# Patient Record
Sex: Male | Born: 1955 | Race: Black or African American | Hispanic: No | Smoking: Current every day smoker
Health system: Southern US, Community
[De-identification: ages and names within clinical notes are randomized; demographics above are authoritative.]

## PROBLEM LIST (undated history)

## (undated) DIAGNOSIS — M1990 Unspecified osteoarthritis, unspecified site: Secondary | ICD-10-CM

## (undated) DIAGNOSIS — J45909 Unspecified asthma, uncomplicated: Secondary | ICD-10-CM

## (undated) DIAGNOSIS — E7800 Pure hypercholesterolemia, unspecified: Secondary | ICD-10-CM

## (undated) DIAGNOSIS — I509 Heart failure, unspecified: Secondary | ICD-10-CM

## (undated) DIAGNOSIS — F4310 Post-traumatic stress disorder, unspecified: Secondary | ICD-10-CM

## (undated) DIAGNOSIS — M545 Low back pain, unspecified: Secondary | ICD-10-CM

## (undated) DIAGNOSIS — R51 Headache: Secondary | ICD-10-CM

## (undated) DIAGNOSIS — M25559 Pain in unspecified hip: Secondary | ICD-10-CM

## (undated) DIAGNOSIS — I10 Essential (primary) hypertension: Secondary | ICD-10-CM

## (undated) DIAGNOSIS — G8929 Other chronic pain: Secondary | ICD-10-CM

## (undated) DIAGNOSIS — J189 Pneumonia, unspecified organism: Secondary | ICD-10-CM

## (undated) DIAGNOSIS — R519 Headache, unspecified: Secondary | ICD-10-CM

## (undated) DIAGNOSIS — F319 Bipolar disorder, unspecified: Secondary | ICD-10-CM

## (undated) DIAGNOSIS — I639 Cerebral infarction, unspecified: Secondary | ICD-10-CM

## (undated) HISTORY — PX: FRACTURE SURGERY: SHX138

## (undated) HISTORY — PX: TONSILLECTOMY: SUR1361

---

## 1983-12-05 HISTORY — PX: ORIF ANKLE FRACTURE: SUR919

## 2013-01-14 ENCOUNTER — Encounter (HOSPITAL_COMMUNITY): Payer: Self-pay | Admitting: Emergency Medicine

## 2013-01-14 ENCOUNTER — Emergency Department (HOSPITAL_COMMUNITY)
Admission: EM | Admit: 2013-01-14 | Discharge: 2013-01-14 | Disposition: A | Discharge status: Home / Self Care | Payer: Self-pay | Attending: Emergency Medicine | Admitting: Emergency Medicine

## 2013-01-14 DIAGNOSIS — L0390 Cellulitis, unspecified: Secondary | ICD-10-CM

## 2013-01-14 DIAGNOSIS — Z79899 Other long term (current) drug therapy: Secondary | ICD-10-CM | POA: Insufficient documentation

## 2013-01-14 DIAGNOSIS — I10 Essential (primary) hypertension: Secondary | ICD-10-CM | POA: Insufficient documentation

## 2013-01-14 DIAGNOSIS — L738 Other specified follicular disorders: Secondary | ICD-10-CM | POA: Insufficient documentation

## 2013-01-14 DIAGNOSIS — F17200 Nicotine dependence, unspecified, uncomplicated: Secondary | ICD-10-CM | POA: Insufficient documentation

## 2013-01-14 DIAGNOSIS — L0201 Cutaneous abscess of face: Secondary | ICD-10-CM | POA: Insufficient documentation

## 2013-01-14 DIAGNOSIS — L739 Follicular disorder, unspecified: Secondary | ICD-10-CM

## 2013-01-14 DIAGNOSIS — J45909 Unspecified asthma, uncomplicated: Secondary | ICD-10-CM | POA: Insufficient documentation

## 2013-01-14 DIAGNOSIS — L03211 Cellulitis of face: Secondary | ICD-10-CM | POA: Insufficient documentation

## 2013-01-14 HISTORY — DX: Essential (primary) hypertension: I10

## 2013-01-14 MED ORDER — CEPHALEXIN 500 MG PO CAPS: 500.0000 mg | ORAL_CAPSULE | Freq: Four times a day (QID) | ORAL | Status: DC

## 2013-01-14 MED ORDER — HYDROCODONE-ACETAMINOPHEN 5-325 MG PO TABS: 1.0000 | ORAL_TABLET | Freq: Four times a day (QID) | ORAL | Status: DC | PRN

## 2013-01-14 MED ORDER — CEPHALEXIN 250 MG PO CAPS
500.0000 mg | ORAL_CAPSULE | Freq: Once | ORAL | Status: AC
  Administered 2013-01-14: 500 mg via ORAL
  Filled 2013-01-14: qty 2

## 2013-01-14 NOTE — ED Notes (Signed)
Pt c/o began with swelling to left corner of mouth noted last night. Pt woke up today with full lip swelling. Pt denies shortness of breath. Pt denies use of new products. Pt talking in complete sentences. Pt reports thought he had ingrown hair and comb his mustache yesterday morning.

## 2013-01-14 NOTE — ED Provider Notes (Signed)
History     CSN: [id number]  Arrival date & time 01/14/13  4098   First MD Initiated Contact with Patient 01/14/13 1112      Chief Complaint  Patient presents with  . Facial Swelling    (Consider location/radiation/quality/duration/timing/severity/associated sxs/prior treatment) HPI Patient presents emergency Department with swelling to the left chin and lower lip.  Patient, states, that he had an immigrant here in the area recently.  Patient denies fevers, nausea, vomiting, difficulty swallowing, difficulty breathing, neck pain, or neck swelling.  Patient did not take anything prior to arrival for his symptoms.  Patient, states palpation makes the pain, worse.  Patient denies anything makes it better. Past Medical History  Diagnosis Date  . Asthma   . Hypertension     History reviewed. No pertinent past surgical history.  No family history on file.  History  Substance Use Topics  . Smoking status: Current Every Day Smoker  . Smokeless tobacco: Not on file  . Alcohol Use: Yes     Comment: rarely      Review of Systems All other systems negative except as documented in the HPI. All pertinent positives and negatives as reviewed in the HPI. Allergies  Review of patient's allergies indicates no known allergies.  Home Medications   Current Outpatient Rx  Name  Route  Sig  Dispense  Refill  . ARIPiprazole (ABILIFY) 5 MG tablet   Oral   Take 5 mg by mouth daily.         Marland Kitchen ibuprofen (ADVIL,MOTRIN) 200 MG tablet   Oral   Take 800 mg by mouth every 6 (six) hours as needed for pain (for back pain.).         Marland Kitchen cephALEXin (KEFLEX) 500 MG capsule   Oral   Take 1 capsule (500 mg total) by mouth 4 (four) times daily.   40 capsule   0   . HYDROcodone-acetaminophen (NORCO/VICODIN) 5-325 MG per tablet   Oral   Take 1 tablet by mouth every 6 (six) hours as needed for pain.   15 tablet   0     BP 136/76  Pulse 54  Temp(Src) 98.1 F (36.7 C) (Oral)  Resp 16   SpO2 100%  Physical Exam  Constitutional: He appears well-developed and well-nourished. No distress.  HENT:  Head: Normocephalic and atraumatic.    Neck: Normal range of motion. Neck supple.  Cardiovascular: Normal rate and normal heart sounds.  Exam reveals no gallop and no friction rub.   No murmur heard. Pulmonary/Chest: Effort normal and breath sounds normal.    ED Course  Procedures (including critical care time)  Labs Reviewed - No data to display No results found.   1. Cellulitis   2. Folliculitis    Patient retreated for cellulitis and folliculitis, and advised to use heat on the area.  Advised him to return here in 2 days for a recheck.  Patient is a rest return here sooner for any worsening in his condition.   The patient has been seen by the attending Physician.   MDM         Carlyle Dolly, PA-C 01/15/13 1418

## 2013-01-15 NOTE — ED Provider Notes (Signed)
Medical screening examination/treatment/procedure(s) were performed by non-physician practitioner and as supervising physician I was immediately available for consultation/collaboration.   Charles B. Sheldon, MD 01/15/13 1553 

## 2013-02-26 ENCOUNTER — Emergency Department (HOSPITAL_COMMUNITY)
Admission: EM | Admit: 2013-02-26 | Discharge: 2013-02-26 | Disposition: A | Discharge status: Home / Self Care | Payer: No Typology Code available for payment source | Attending: Emergency Medicine | Admitting: Emergency Medicine

## 2013-02-26 ENCOUNTER — Encounter (HOSPITAL_COMMUNITY): Payer: Self-pay | Admitting: Emergency Medicine

## 2013-02-26 ENCOUNTER — Emergency Department (HOSPITAL_COMMUNITY): Payer: No Typology Code available for payment source

## 2013-02-26 DIAGNOSIS — Z79899 Other long term (current) drug therapy: Secondary | ICD-10-CM | POA: Insufficient documentation

## 2013-02-26 DIAGNOSIS — I10 Essential (primary) hypertension: Secondary | ICD-10-CM | POA: Insufficient documentation

## 2013-02-26 DIAGNOSIS — Y92410 Unspecified street and highway as the place of occurrence of the external cause: Secondary | ICD-10-CM | POA: Insufficient documentation

## 2013-02-26 DIAGNOSIS — F17200 Nicotine dependence, unspecified, uncomplicated: Secondary | ICD-10-CM | POA: Insufficient documentation

## 2013-02-26 DIAGNOSIS — IMO0002 Reserved for concepts with insufficient information to code with codable children: Secondary | ICD-10-CM | POA: Insufficient documentation

## 2013-02-26 DIAGNOSIS — J45909 Unspecified asthma, uncomplicated: Secondary | ICD-10-CM | POA: Insufficient documentation

## 2013-02-26 DIAGNOSIS — Y9389 Activity, other specified: Secondary | ICD-10-CM | POA: Insufficient documentation

## 2013-02-26 MED ORDER — IBUPROFEN 800 MG PO TABS
800.0000 mg | ORAL_TABLET | Freq: Once | ORAL | Status: AC
  Administered 2013-02-26: 800 mg via ORAL
  Filled 2013-02-26: qty 1

## 2013-02-26 MED ORDER — DIAZEPAM 5 MG PO TABS: 5.0000 mg | ORAL_TABLET | Freq: Two times a day (BID) | ORAL | Status: DC

## 2013-02-26 MED ORDER — IBUPROFEN 800 MG PO TABS: 800.0000 mg | ORAL_TABLET | Freq: Three times a day (TID) | ORAL | Status: DC
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
## 2013-03-18 ENCOUNTER — Encounter (HOSPITAL_COMMUNITY): Payer: Self-pay | Admitting: *Deleted

## 2013-03-18 ENCOUNTER — Emergency Department (HOSPITAL_COMMUNITY)
Admission: EM | Admit: 2013-03-18 | Discharge: 2013-03-18 | Disposition: A | Discharge status: Home / Self Care | Payer: No Typology Code available for payment source | Attending: Emergency Medicine | Admitting: Emergency Medicine

## 2013-03-18 DIAGNOSIS — M545 Low back pain, unspecified: Secondary | ICD-10-CM | POA: Insufficient documentation

## 2013-03-18 DIAGNOSIS — G8929 Other chronic pain: Secondary | ICD-10-CM | POA: Insufficient documentation

## 2013-03-18 DIAGNOSIS — Z79899 Other long term (current) drug therapy: Secondary | ICD-10-CM | POA: Insufficient documentation

## 2013-03-18 DIAGNOSIS — Y92410 Unspecified street and highway as the place of occurrence of the external cause: Secondary | ICD-10-CM | POA: Insufficient documentation

## 2013-03-18 DIAGNOSIS — I10 Essential (primary) hypertension: Secondary | ICD-10-CM | POA: Insufficient documentation

## 2013-03-18 DIAGNOSIS — J45909 Unspecified asthma, uncomplicated: Secondary | ICD-10-CM | POA: Insufficient documentation

## 2013-03-18 DIAGNOSIS — T148XXA Other injury of unspecified body region, initial encounter: Secondary | ICD-10-CM

## 2013-03-18 DIAGNOSIS — IMO0002 Reserved for concepts with insufficient information to code with codable children: Secondary | ICD-10-CM | POA: Insufficient documentation

## 2013-03-18 DIAGNOSIS — Y9389 Activity, other specified: Secondary | ICD-10-CM | POA: Insufficient documentation

## 2013-03-18 DIAGNOSIS — F17200 Nicotine dependence, unspecified, uncomplicated: Secondary | ICD-10-CM | POA: Insufficient documentation

## 2013-03-18 MED ORDER — METHOCARBAMOL 500 MG PO TABS: 500.0000 mg | ORAL_TABLET | Freq: Four times a day (QID) | ORAL | Status: DC | PRN

## 2013-03-18 NOTE — ED Notes (Signed)
Patient states that his chronic pain is the same.   Patient states that he went to doctor that was recommended here last time, but he was not able to afford.  He claims that the ibuprofen keeps him comfortable, but he can't go to the doctor.

## 2013-03-18 NOTE — ED Notes (Signed)
Pt was seen here on the 26th for lower back pain and given rx for ibuprofen.  States the meds have run out and the pain is getting worse, stating that his R leg is giving out.
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
## 2013-03-19 NOTE — ED Provider Notes (Signed)
Medical screening examination/treatment/procedure(s) were performed by non-physician practitioner and as supervising physician I was immediately available for consultation/collaboration.  Tobin Chad, MD 03/19/13 [phone]

## 2013-03-25 ENCOUNTER — Encounter (HOSPITAL_COMMUNITY): Payer: Self-pay | Admitting: Emergency Medicine

## 2013-03-25 ENCOUNTER — Emergency Department (HOSPITAL_COMMUNITY)
Admission: EM | Admit: 2013-03-25 | Discharge: 2013-03-25 | Disposition: A | Discharge status: Home / Self Care | Payer: No Typology Code available for payment source | Attending: Emergency Medicine | Admitting: Emergency Medicine

## 2013-03-25 DIAGNOSIS — Y929 Unspecified place or not applicable: Secondary | ICD-10-CM | POA: Insufficient documentation

## 2013-03-25 DIAGNOSIS — J45909 Unspecified asthma, uncomplicated: Secondary | ICD-10-CM | POA: Insufficient documentation

## 2013-03-25 DIAGNOSIS — S335XXA Sprain of ligaments of lumbar spine, initial encounter: Secondary | ICD-10-CM | POA: Insufficient documentation

## 2013-03-25 DIAGNOSIS — I10 Essential (primary) hypertension: Secondary | ICD-10-CM | POA: Insufficient documentation

## 2013-03-25 DIAGNOSIS — T148XXA Other injury of unspecified body region, initial encounter: Secondary | ICD-10-CM

## 2013-03-25 DIAGNOSIS — Y939 Activity, unspecified: Secondary | ICD-10-CM | POA: Insufficient documentation

## 2013-03-25 DIAGNOSIS — F17200 Nicotine dependence, unspecified, uncomplicated: Secondary | ICD-10-CM | POA: Insufficient documentation

## 2013-03-25 DIAGNOSIS — X58XXXA Exposure to other specified factors, initial encounter: Secondary | ICD-10-CM | POA: Insufficient documentation

## 2013-03-25 DIAGNOSIS — Z79899 Other long term (current) drug therapy: Secondary | ICD-10-CM | POA: Insufficient documentation

## 2013-03-25 MED ORDER — IBUPROFEN 800 MG PO TABS: 800.0000 mg | ORAL_TABLET | Freq: Three times a day (TID) | ORAL | Status: DC

## 2013-03-25 MED ORDER — DIAZEPAM 5 MG PO TABS: 5.0000 mg | ORAL_TABLET | Freq: Two times a day (BID) | ORAL | Status: DC | PRN

## 2013-03-25 NOTE — ED Provider Notes (Signed)
History     CSN: [id number]  Arrival date & time 03/25/13  0845   First MD Initiated Contact with Patient 03/25/13 [phone]      No chief complaint on file.  Chief complaint: back pain (Consider location/radiation/quality/duration/timing/severity/associated sxs/prior treatment) HPI Comments: Patient presents for an intermittent throbbing sensation in his right lower back that waxes and wanes in severity and sometimes radiates down his right leg. Patient has been evaluated in the emergency department within the last week for this complaint and states that his back pain symptoms have not changed since this time. Patient was given Robaxin to take for symptoms which he states made him nauseous with nonbloody, nonbilious emesis as well as giving him abdominal cramps and watery, nonbloody diarrhea. Patient states he stopped taking the medication a few days ago and these symptoms have completely resolved. Patient states that ibuprofen and Valium have helped his lower back discomfort and he has run out of both of these prescriptions. Patient denies any fever, weakness, inability to ambulate, saddle anesthesia, or bladder or bowel incontinence. He denies new or recent trauma to his back since last evaluation.  The history is provided by the patient. No language interpreter was used.    Past Medical History  Diagnosis Date  . Asthma   . Hypertension     Past Surgical History  Procedure Laterality Date  . Orif right ankle Right     No family history on file.  History  Substance Use Topics  . Smoking status: Current Every Day Smoker -- 0.50 packs/day    Types: Cigarettes  . Smokeless tobacco: Not on file  . Alcohol Use: Yes     Comment: rarely      Review of Systems  Constitutional: Negative for fever.  Musculoskeletal: Positive for myalgias and back pain.  Skin: Negative for pallor and rash.  Neurological: Negative for syncope, weakness and numbness.  All other systems reviewed and  are negative.    Allergies  Review of patient's allergies indicates no known allergies.  Home Medications   Current Outpatient Rx  Name  Route  Sig  Dispense  Refill  . ARIPiprazole (ABILIFY) 5 MG tablet   Oral   Take 5 mg by mouth daily.         Marland Kitchen ibuprofen (ADVIL,MOTRIN) 800 MG tablet   Oral   Take 800 mg by mouth every 8 (eight) hours as needed for pain.         . diazepam (VALIUM) 5 MG tablet   Oral   Take 1 tablet (5 mg total) by mouth 2 (two) times daily as needed for anxiety.   10 tablet   0   . ibuprofen (ADVIL,MOTRIN) 800 MG tablet   Oral   Take 1 tablet (800 mg total) by mouth 3 (three) times daily.   21 tablet   0   . methocarbamol (ROBAXIN) 500 MG tablet   Oral   Take 1 tablet (500 mg total) by mouth 4 (four) times daily as needed (back pain, spasm).   20 tablet   0     BP 136/83  Pulse 61  Temp(Src) 98.8 F (37.1 C) (Oral)  Resp 18  SpO2 99%  Physical Exam  Nursing note and vitals reviewed. Constitutional: He is oriented to person, place, and time. He appears well-developed and well-nourished.  HENT:  Head: Normocephalic and atraumatic.  Eyes: Conjunctivae are normal. No scleral icterus.  Neck: Normal range of motion.  Cardiovascular: Normal rate, regular rhythm and intact  distal pulses.   Pulmonary/Chest: Effort normal. No respiratory distress.  Abdominal: Soft. He exhibits no distension. There is no tenderness.  Musculoskeletal: Normal range of motion.       Cervical back: Normal.       Thoracic back: Normal.       Lumbar back: He exhibits tenderness. He exhibits normal range of motion, no bony tenderness and no deformity.       Back:  Patient with TTP of the right paraspinal muscles of the lumbar spine. No cervical, thoracic, or lumbar midline tenderness or bony deformities palpated. Patient exhibits full range of motion of his back with negative straight leg raising cross straight leg raise. No sensory or motor deficits appreciated.    Neurological: He is alert and oriented to person, place, and time. He has normal reflexes.  Skin: Skin is warm and dry. No rash noted. No erythema.  Psychiatric: He has a normal mood and affect. His behavior is normal.    ED Course  Procedures (including critical care time)  Labs Reviewed - No data to display No results found.   1. Muscle strain     MDM  Uncomplicated muscle strain. Patient presents for medication as he ran out of 800mg  ibuprofen and robaxin he received approximately 1 week ago gave him nausea, NB/NB emesis and abdominal cramping; he stopped taking robaxin a few days ago with complete relief of symptoms. Patient neurovascularly intact and ambulatory with normal gait; no saddle anesthesia or bowel/bladder incontinence. Stable for d/c with PCP follow up. Will prescribe ibuprofen and valium as patient has taken these in the past with relief. Indications for ED return discussed. Patient states comfort and understanding with d/c plan with no unaddressed concerns.   Medical screening examination/treatment/procedure(s) were performed by non-physician practitioner and as supervising physician I was immediately available for consultation/collaboration.     Antony Madura, PA-C 03/27/13 9811  Donnetta Hutching, MD 03/27/13 [phone]

## 2013-03-25 NOTE — ED Notes (Signed)
Pt started having an allergic reaction to a new medication. C/o nausea, vomiting, diarrhea, and feeling anxious.  Pt stopped med after symptoms started.

## 2013-04-07 ENCOUNTER — Encounter (HOSPITAL_COMMUNITY): Payer: Self-pay | Admitting: *Deleted

## 2013-04-07 ENCOUNTER — Emergency Department (HOSPITAL_COMMUNITY)
Admission: EM | Admit: 2013-04-07 | Discharge: 2013-04-07 | Disposition: A | Discharge status: Home / Self Care | Payer: Self-pay | Attending: Emergency Medicine | Admitting: Emergency Medicine

## 2013-04-07 DIAGNOSIS — I10 Essential (primary) hypertension: Secondary | ICD-10-CM | POA: Insufficient documentation

## 2013-04-07 DIAGNOSIS — L0201 Cutaneous abscess of face: Secondary | ICD-10-CM | POA: Insufficient documentation

## 2013-04-07 DIAGNOSIS — F17200 Nicotine dependence, unspecified, uncomplicated: Secondary | ICD-10-CM | POA: Insufficient documentation

## 2013-04-07 DIAGNOSIS — Z79899 Other long term (current) drug therapy: Secondary | ICD-10-CM | POA: Insufficient documentation

## 2013-04-07 DIAGNOSIS — L03211 Cellulitis of face: Secondary | ICD-10-CM | POA: Insufficient documentation

## 2013-04-07 DIAGNOSIS — J45909 Unspecified asthma, uncomplicated: Secondary | ICD-10-CM | POA: Insufficient documentation

## 2013-04-07 DIAGNOSIS — F319 Bipolar disorder, unspecified: Secondary | ICD-10-CM | POA: Insufficient documentation

## 2013-04-07 HISTORY — DX: Bipolar disorder, unspecified: F31.9

## 2013-04-07 MED ORDER — CLINDAMYCIN HCL 150 MG PO CAPS: 150.0000 mg | ORAL_CAPSULE | Freq: Four times a day (QID) | ORAL | Status: DC

## 2013-04-07 MED ORDER — HYDROCODONE-ACETAMINOPHEN 5-325 MG PO TABS: 2.0000 | ORAL_TABLET | ORAL | Status: DC | PRN

## 2013-04-07 NOTE — ED Notes (Signed)
Pt is here with right facial abscess that is draining

## 2013-04-07 NOTE — ED Provider Notes (Signed)
History     CSN: [id number]  Arrival date & time 04/07/13  0918   First MD Initiated Contact with Patient 04/07/13 1033      Chief Complaint  Patient presents with  . Abscess    (Consider location/radiation/quality/duration/timing/severity/associated sxs/prior treatment) HPI  57 year old male presents for evaluations of a boil on his face.  Patient reports gradual onset of swelling and pain to his right cheek for the past 2-3 days. Pain is described as a sharp throbbing sensation, nonradiating, worsening with palpation. Swelling is similar to a prior abscess that he had on his left side of face in the past. He believes it is related to ingrown hair. He has been using warm compress on face and his is having some mild drainage. He is here requesting for antibiotic and pain medication only. Patient does not want any incision and drainage here. She also denies fever, headache, dental pain, or recent trauma.  Past Medical History  Diagnosis Date  . Asthma   . Hypertension   . Bipolar 1 disorder     Past Surgical History  Procedure Laterality Date  . Orif right ankle Right     No family history on file.  History  Substance Use Topics  . Smoking status: Current Every Day Smoker -- 0.50 packs/day    Types: Cigarettes  . Smokeless tobacco: Not on file  . Alcohol Use: Yes     Comment: rarely      Review of Systems  HENT: Positive for facial swelling. Negative for sore throat and dental problem.   Skin: Negative for rash.  Neurological: Negative for numbness and headaches.    Allergies  Methocarbamol  Home Medications   Current Outpatient Rx  Name  Route  Sig  Dispense  Refill  . ARIPiprazole (ABILIFY) 5 MG tablet   Oral   Take 5 mg by mouth daily.         . diazepam (VALIUM) 5 MG tablet   Oral   Take 1 tablet (5 mg total) by mouth 2 (two) times daily as needed for anxiety.   10 tablet   0   . ibuprofen (ADVIL,MOTRIN) 800 MG tablet   Oral   Take 800 mg by  mouth every 8 (eight) hours as needed for pain.           BP 138/75  Pulse 58  Temp(Src) 98.3 F (36.8 C)  Resp 18  SpO2 98%  Physical Exam  Nursing note and vitals reviewed. Constitutional: He appears well-developed and well-nourished. No distress.  HENT:  Head: Atraumatic.  No dental pain.  No trismus.  Eyes: Conjunctivae are normal.  Neck: Neck supple.  Lymphadenopathy:    He has no cervical adenopathy.  Neurological: He is alert.  Skin: Skin is warm.  R cheek on face: an area of induration and fluctuance measuring 2cm in diameter were noted with tenderness to palpation, no erythema.  No fb seen or palpated.        ED Course  Procedures (including critical care time)  Pt with abscess to R cheek amenable for I&D however pt request for abx and pain medication only.  Sts he would like to continue using warm compress and treat sxs at home.  I suggest to return if sxs worsen despite using abx and pain meds.  Pt agrees with plan.    Labs Reviewed - No data to display No results found.   1. Cutaneous abscess of face       MDM  BP 138/75  Pulse 58  Temp(Src) 98.3 F (36.8 C)  Resp 18  SpO2 98%         Fayrene Helper, PA-C 04/07/13 1042

## 2013-04-07 NOTE — ED Provider Notes (Signed)
Medical screening examination/treatment/procedure(s) were performed by non-physician practitioner and as supervising physician I was immediately available for consultation/collaboration. Devoria Albe, MD, FACEP   Ward Givens, MD 04/07/13 1049

## 2013-04-10 ENCOUNTER — Emergency Department (HOSPITAL_COMMUNITY)
Admission: EM | Admit: 2013-04-10 | Discharge: 2013-04-10 | Disposition: A | Discharge status: Home / Self Care | Payer: Self-pay | Attending: Emergency Medicine | Admitting: Emergency Medicine

## 2013-04-10 ENCOUNTER — Encounter (HOSPITAL_COMMUNITY): Payer: Self-pay | Admitting: Emergency Medicine

## 2013-04-10 DIAGNOSIS — L0201 Cutaneous abscess of face: Secondary | ICD-10-CM | POA: Insufficient documentation

## 2013-04-10 DIAGNOSIS — I10 Essential (primary) hypertension: Secondary | ICD-10-CM | POA: Insufficient documentation

## 2013-04-10 DIAGNOSIS — L0291 Cutaneous abscess, unspecified: Secondary | ICD-10-CM

## 2013-04-10 DIAGNOSIS — Z79899 Other long term (current) drug therapy: Secondary | ICD-10-CM | POA: Insufficient documentation

## 2013-04-10 DIAGNOSIS — F17200 Nicotine dependence, unspecified, uncomplicated: Secondary | ICD-10-CM | POA: Insufficient documentation

## 2013-04-10 DIAGNOSIS — F319 Bipolar disorder, unspecified: Secondary | ICD-10-CM | POA: Insufficient documentation

## 2013-04-10 DIAGNOSIS — L03211 Cellulitis of face: Secondary | ICD-10-CM | POA: Insufficient documentation

## 2013-04-10 DIAGNOSIS — J45909 Unspecified asthma, uncomplicated: Secondary | ICD-10-CM | POA: Insufficient documentation

## 2013-04-10 DIAGNOSIS — Z888 Allergy status to other drugs, medicaments and biological substances status: Secondary | ICD-10-CM | POA: Insufficient documentation

## 2013-04-10 NOTE — ED Provider Notes (Signed)
History     CSN: [id number]  Arrival date & time 04/10/13  0919   First MD Initiated Contact with Patient 04/10/13 1021      Chief Complaint  Patient presents with  . Abscess    (Consider location/radiation/quality/duration/timing/severity/associated sxs/prior treatment) HPI Comments: Patient presents to the ED for right facial abscess. Abscess initially started after pt shaved his face a few days ago.  Pt seen 3 days ago for the same, abscess was not drained at that time.  He was started on clindamycin which he has been taking as directed but notes there has been no improvement.  Denies any drainage from abscess for the past few days.  Notes area has become larger and is increasingly TTP.  Denies any numbness or paresthesias of face.  Prior abscesses similar to this requiring I&D, also from shaving.  No recent fever, sweats, or chills.  Patient is a 57 y.o. male presenting with abscess. The history is provided by the patient.  Abscess   Past Medical History  Diagnosis Date  . Asthma   . Hypertension   . Bipolar 1 disorder     Past Surgical History  Procedure Laterality Date  . Orif right ankle Right     No family history on file.  History  Substance Use Topics  . Smoking status: Current Every Day Smoker -- 0.50 packs/day    Types: Cigarettes  . Smokeless tobacco: Not on file  . Alcohol Use: Yes     Comment: rarely      Review of Systems  Skin:       abscess  All other systems reviewed and are negative.    Allergies  Methocarbamol  Home Medications   Current Outpatient Rx  Name  Route  Sig  Dispense  Refill  . ARIPiprazole (ABILIFY) 5 MG tablet   Oral   Take 5 mg by mouth daily.         . clindamycin (CLEOCIN) 150 MG capsule   Oral   Take 150 mg by mouth 3 (three) times daily. 7 day course started on 04-07-13         . diazepam (VALIUM) 5 MG tablet   Oral   Take 1 tablet (5 mg total) by mouth 2 (two) times daily as needed for anxiety.   10  tablet   0   . HYDROcodone-acetaminophen (NORCO/VICODIN) 5-325 MG per tablet   Oral   Take 2 tablets by mouth every 4 (four) hours as needed for pain.   10 tablet   0   . ibuprofen (ADVIL,MOTRIN) 800 MG tablet   Oral   Take 800 mg by mouth every 8 (eight) hours as needed for pain.           BP 133/90  Pulse 57  Temp(Src) 98 F (36.7 C) (Oral)  Resp 12  Ht 5\' 9"  (1.753 m)  Wt 160 lb (72.576 kg)  BMI 23.62 kg/m2  SpO2 96%  Physical Exam  Nursing note and vitals reviewed. Constitutional: He is oriented to person, place, and time. He appears well-developed and well-nourished.  HENT:  Head: Normocephalic and atraumatic.    Eyes: Conjunctivae and EOM are normal. Pupils are equal, round, and reactive to light.  Neck: Normal range of motion.  Cardiovascular: Normal rate, regular rhythm and normal heart sounds.   Pulmonary/Chest: Effort normal and breath sounds normal.  Musculoskeletal: Normal range of motion.  Neurological: He is alert and oriented to person, place, and time.  Skin: Skin is  warm and dry.  Psychiatric: He has a normal mood and affect.    ED Course  Procedures (including critical care time)  INCISION AND DRAINAGE Performed by: Garlon Hatchet Consent: Verbal consent obtained. Risks and benefits: risks, benefits and alternatives were discussed Type: abscess  Body area: right cheek  Anesthesia: local infiltration  Incision was made with a scalpel.  Local anesthetic: lidocaine 2% without epi  Anesthetic total: 2 ml  Complexity: complex Blunt dissection to break up loculations  Drainage: purulent  Drainage amount: small  Packing material: 1/4 in iodoform gauze  Patient tolerance: Patient tolerated the procedure well with no immediate complications.     Labs Reviewed - No data to display No results found.   1. Abscess       MDM   Pt presenting to the ED for abscess of right cheek.  Seen 3 days ago for same but I&D not  performed.  Taking clindamycin as directed without improvement.  Incision of wound as above.  Small amount of purulent material expelled.  Packing material inserted to keep wound open and facilitate further drainage.  Continue clindamycin as directed.  FU with [facility] urgent care in 2-3 days for packing removal/wound check.  Discussed plan with pt, he agreed.  Return precautions advised.      Garlon Hatchet, PA-C 04/10/13 1541

## 2013-04-10 NOTE — ED Notes (Signed)
Pt states he came to ED on Monday for his abscess on the right side of his face. Pt states he was given an antibiotic and instruction to return if the abscess did not go away. Pt states he was told to come back and to get it drained if it was not better in three days. Pt is here on the third day. Pt has been taking his antibiotic every six hours. Pt states he is on day four of his antibiotic.

## 2013-04-11 NOTE — ED Provider Notes (Signed)
Medical screening examination/treatment/procedure(s) were performed by non-physician practitioner and as supervising physician I was immediately available for consultation/collaboration.   Loren Racer, MD 04/11/13 [phone]

## 2013-08-18 ENCOUNTER — Encounter: Payer: Self-pay | Admitting: Nurse Practitioner

## 2013-08-20 ENCOUNTER — Ambulatory Visit: Payer: Self-pay | Admitting: Nurse Practitioner

## 2013-09-09 ENCOUNTER — Encounter (HOSPITAL_COMMUNITY): Payer: Self-pay | Admitting: Emergency Medicine

## 2013-09-09 ENCOUNTER — Emergency Department (HOSPITAL_COMMUNITY)
Admission: EM | Admit: 2013-09-09 | Discharge: 2013-09-09 | Disposition: A | Discharge status: Home / Self Care | Payer: Self-pay | Attending: Emergency Medicine | Admitting: Emergency Medicine

## 2013-09-09 DIAGNOSIS — J45909 Unspecified asthma, uncomplicated: Secondary | ICD-10-CM | POA: Insufficient documentation

## 2013-09-09 DIAGNOSIS — Z79899 Other long term (current) drug therapy: Secondary | ICD-10-CM | POA: Insufficient documentation

## 2013-09-09 DIAGNOSIS — I10 Essential (primary) hypertension: Secondary | ICD-10-CM | POA: Insufficient documentation

## 2013-09-09 DIAGNOSIS — M549 Dorsalgia, unspecified: Secondary | ICD-10-CM | POA: Insufficient documentation

## 2013-09-09 DIAGNOSIS — R21 Rash and other nonspecific skin eruption: Secondary | ICD-10-CM | POA: Insufficient documentation

## 2013-09-09 DIAGNOSIS — F319 Bipolar disorder, unspecified: Secondary | ICD-10-CM | POA: Insufficient documentation

## 2013-09-09 DIAGNOSIS — Z888 Allergy status to other drugs, medicaments and biological substances status: Secondary | ICD-10-CM | POA: Insufficient documentation

## 2013-09-09 DIAGNOSIS — F17200 Nicotine dependence, unspecified, uncomplicated: Secondary | ICD-10-CM | POA: Insufficient documentation

## 2013-09-09 DIAGNOSIS — G8929 Other chronic pain: Secondary | ICD-10-CM | POA: Insufficient documentation

## 2013-09-09 LAB — HIV ANTIBODY (ROUTINE TESTING W REFLEX): HIV: NONREACTIVE

## 2013-09-09 MED ORDER — LORATADINE 10 MG PO TABS: 10.0000 mg | ORAL_TABLET | Freq: Every day | ORAL | Status: DC

## 2013-09-09 MED ORDER — PERMETHRIN 5 % EX CREA: TOPICAL_CREAM | Freq: Once | CUTANEOUS | Status: DC

## 2013-09-09 NOTE — ED Notes (Signed)
C/o "knot on left arm-- and rash on both arms. Also have a knot on my back" --

## 2013-09-09 NOTE — ED Provider Notes (Addendum)
CSN: [id number]     Arrival date & time 09/09/13  1478 History   First MD Initiated Contact with Patient 09/09/13 [phone]     Chief Complaint  Patient presents with  . Rash   HPI Pt believes he was bitten by something  Two days ago.  Since then it has turned black and blue and he noticed a rash on his arms.    The bite does not hurt but the rash does feel itchy. No fevers.  No vomiting or diarrhea.  No muscle aches.   Past Medical History  Diagnosis Date  . Asthma   . Hypertension   . Bipolar 1 disorder    Past Surgical History  Procedure Laterality Date  . Orif right ankle Right 1985   History reviewed. No pertinent family history. History  Substance Use Topics  . Smoking status: Current Every Day Smoker -- 0.50 packs/day for 18 years    Types: Cigarettes  . Smokeless tobacco: Not on file  . Alcohol Use: Yes     Comment: 2 drinks weekly     Review of Systems  Constitutional: Negative for fever and fatigue.  Musculoskeletal: Positive for back pain (chronic). Negative for joint swelling.  Skin: Positive for rash.    Allergies  Methocarbamol  Home Medications   Current Outpatient Rx  Name  Route  Sig  Dispense  Refill  . acetaminophen (TYLENOL) 500 MG tablet   Oral   Take 500 mg by mouth as needed for pain.         . ARIPiprazole (ABILIFY) 5 MG tablet   Oral   Take 5 mg by mouth daily.         Marland Kitchen loratadine (CLARITIN) 10 MG tablet   Oral   Take 1 tablet (10 mg total) by mouth daily.   14 tablet   0   . permethrin (ELIMITE) 5 % cream   Topical   Apply topically once.   60 g   0    BP 148/84  Pulse 65  Temp(Src) 97.9 F (36.6 C) (Oral)  Resp 16  SpO2 97% Physical Exam  Nursing note and vitals reviewed. Constitutional: He appears well-developed and well-nourished. No distress.  HENT:  Head: Normocephalic and atraumatic.  Right Ear: External ear normal.  Left Ear: External ear normal.  Eyes: Conjunctivae are normal. Right eye exhibits no discharge.  Left eye exhibits no discharge. No scleral icterus.  Neck: Neck supple. No tracheal deviation present.  Cardiovascular: Normal rate.   Pulmonary/Chest: Effort normal. No stridor. No respiratory distress.  Musculoskeletal: He exhibits no edema.  Neurological: He is alert. Cranial nerve deficit: no gross deficits.  Skin: Skin is warm and dry.  Small half centimeter area of induration left anterior volar aspect of the forearm with a surrounding area ecchymoses that does not blanch, no pustule, no fluctuance; several small areas of excoriated papules on bilateral forearms extending proximally, no surrounding erythema  Psychiatric: He has a normal mood and affect.    ED Course  Procedures (including critical care time) Labs Review Labs Reviewed - No data to display Imaging Review No results found.  MDM   1. Rash    The patient's small circular ring area seems to be an area of ecchymoses, there is no abscess or fluctuance. This does not appear to be consistent with a target lesion. The area does not blanch. Patient does have small  areas of papules this could be related to scabies. Treat  With antihistamines and  Elimite.     Celene Kras, MD 09/09/13 [phone]  Pt requests an HIV screen.  The lesion could be consistent with a kaposi's lesion.  HIV screen added on.  Celene Kras, MD 09/09/13 [phone]

## 2013-09-10 ENCOUNTER — Telehealth (HOSPITAL_COMMUNITY): Payer: Self-pay | Admitting: Emergency Medicine

## 2013-09-12 ENCOUNTER — Encounter (HOSPITAL_COMMUNITY): Payer: Self-pay | Admitting: Emergency Medicine

## 2013-09-12 ENCOUNTER — Emergency Department (HOSPITAL_COMMUNITY)
Admission: EM | Admit: 2013-09-12 | Discharge: 2013-09-12 | Disposition: A | Discharge status: Home / Self Care | Payer: No Typology Code available for payment source | Attending: Emergency Medicine | Admitting: Emergency Medicine

## 2013-09-12 DIAGNOSIS — R21 Rash and other nonspecific skin eruption: Secondary | ICD-10-CM | POA: Insufficient documentation

## 2013-09-12 DIAGNOSIS — Z79899 Other long term (current) drug therapy: Secondary | ICD-10-CM | POA: Insufficient documentation

## 2013-09-12 DIAGNOSIS — F319 Bipolar disorder, unspecified: Secondary | ICD-10-CM | POA: Insufficient documentation

## 2013-09-12 DIAGNOSIS — J45909 Unspecified asthma, uncomplicated: Secondary | ICD-10-CM | POA: Insufficient documentation

## 2013-09-12 DIAGNOSIS — I10 Essential (primary) hypertension: Secondary | ICD-10-CM | POA: Insufficient documentation

## 2013-09-12 DIAGNOSIS — F17200 Nicotine dependence, unspecified, uncomplicated: Secondary | ICD-10-CM | POA: Insufficient documentation

## 2013-09-12 DIAGNOSIS — Z760 Encounter for issue of repeat prescription: Secondary | ICD-10-CM

## 2013-09-12 MED ORDER — PERMETHRIN 5 % EX CREA
TOPICAL_CREAM | Freq: Once | CUTANEOUS | Status: AC
  Administered 2013-09-12: 10:00:00 via TOPICAL
  Filled 2013-09-12: qty 60

## 2013-09-12 NOTE — ED Provider Notes (Signed)
Medical screening examination/treatment/procedure(s) were performed by non-physician practitioner and as supervising physician I was immediately available for consultation/collaboration.   Darlys Gales, MD 09/12/13 [phone]

## 2013-09-12 NOTE — ED Provider Notes (Signed)
CSN: [id number]     Arrival date & time 09/12/13  0919 History  This chart was scribed for non-physician practitioner Sharilyn Sites, PA-C working with Darlys Gales, MD by Joaquin Music, ED Scribe. This patient was seen in room TR06C/TR06C and the patient's care was started at 9:35 AM .    Chief Complaint  Patient presents with  . Medication Refill    The history is provided by the patient. No language interpreter was used.   HPI Comments: Ricardo Brooks is a 57 y.o. male who presents to the Emergency Department complaining of bilateral upper extremities rash x3 days. Pt came to the ED, dx with likely scabies and was prescribed permethrin but states he could not fill it because it cost $95. Was instructed to take benadryl which he tried but states it made his sleepy but did not help the itching.  Has not tried any other medications.  No fevers, sweats, or chills.  No arthralgias or myalgias.  No recent tick exposure.  Past Medical History  Diagnosis Date  . Asthma   . Hypertension   . Bipolar 1 disorder    Past Surgical History  Procedure Laterality Date  . Orif right ankle Right 1985   No family history on file. History  Substance Use Topics  . Smoking status: Current Every Day Smoker -- 0.50 packs/day for 18 years    Types: Cigarettes  . Smokeless tobacco: Not on file  . Alcohol Use: Yes     Comment: 2 drinks weekly     Review of Systems  Skin: Positive for rash.  All other systems reviewed and are negative.    Allergies  Methocarbamol  Home Medications   Current Outpatient Rx  Name  Route  Sig  Dispense  Refill  . acetaminophen (TYLENOL) 500 MG tablet   Oral   Take 500 mg by mouth as needed for pain.         . ARIPiprazole (ABILIFY) 5 MG tablet   Oral   Take 5 mg by mouth daily.         Marland Kitchen loratadine (CLARITIN) 10 MG tablet   Oral   Take 1 tablet (10 mg total) by mouth daily.   14 tablet   0   . permethrin (ELIMITE) 5 % cream   Topical  Apply topically once.   60 g   0    Triage Vitals:BP 138/78  Pulse 65  Temp(Src) 97.1 F (36.2 C)  SpO2 99%  Physical Exam  Nursing note and vitals reviewed. Constitutional: He is oriented to person, place, and time. He appears well-developed and well-nourished. No distress.  HENT:  Head: Normocephalic and atraumatic.  Mouth/Throat: Oropharynx is clear and moist.  Eyes: Conjunctivae and EOM are normal. Pupils are equal, round, and reactive to light.  Neck: Normal range of motion. Neck supple.  Cardiovascular: Normal rate, regular rhythm and normal heart sounds.   Pulmonary/Chest: Effort normal and breath sounds normal. No respiratory distress. He has no wheezes.  Musculoskeletal: Normal range of motion.  Neurological: He is alert and oriented to person, place, and time.  Skin: Skin is warm and dry. Rash noted. He is not diaphoretic.  Pruritic rash to bilateral forearms; some lesions with excoriation; no surrounding erythema, drainage, induration, or signs of cellulitis  Psychiatric: He has a normal mood and affect.    ED Course  Procedures  DIAGNOSTIC STUDIES: Oxygen Saturation is 97% on RA, normal by my interpretation.    COORDINATION OF CARE: 9:37 AM-Discussed  treatment plan which includes prescribing medication. Pt agreed to plan.   Labs Review Labs Reviewed - No data to display Imaging Review No results found.  MDM   1. Medication refill     Permethrin given in the ED, pt was instructed on use. FU with [facility] if problems occur.  Discussed plan with pt, they agreed.  Return precautions advised.  I personally performed the services described in this documentation, which was scribed in my presence. The recorded information has been reviewed and is accurate.  Garlon Hatchet, PA-C 09/12/13 1246

## 2013-09-12 NOTE — ED Notes (Signed)
Pt was here yesterday. Dx'd with scabies. Was unable to afford the Rx given.

## 2014-06-06 ENCOUNTER — Emergency Department (HOSPITAL_COMMUNITY)
Admission: EM | Admit: 2014-06-06 | Discharge: 2014-06-06 | Disposition: A | Discharge status: Home / Self Care | Payer: No Typology Code available for payment source | Attending: Emergency Medicine | Admitting: Emergency Medicine

## 2014-06-06 ENCOUNTER — Encounter (HOSPITAL_COMMUNITY): Payer: Self-pay | Admitting: Emergency Medicine

## 2014-06-06 DIAGNOSIS — G8929 Other chronic pain: Secondary | ICD-10-CM

## 2014-06-06 DIAGNOSIS — F17200 Nicotine dependence, unspecified, uncomplicated: Secondary | ICD-10-CM | POA: Insufficient documentation

## 2014-06-06 DIAGNOSIS — M545 Low back pain, unspecified: Secondary | ICD-10-CM | POA: Insufficient documentation

## 2014-06-06 DIAGNOSIS — F319 Bipolar disorder, unspecified: Secondary | ICD-10-CM | POA: Insufficient documentation

## 2014-06-06 DIAGNOSIS — I10 Essential (primary) hypertension: Secondary | ICD-10-CM | POA: Insufficient documentation

## 2014-06-06 DIAGNOSIS — Z79899 Other long term (current) drug therapy: Secondary | ICD-10-CM | POA: Insufficient documentation

## 2014-06-06 DIAGNOSIS — J45909 Unspecified asthma, uncomplicated: Secondary | ICD-10-CM | POA: Insufficient documentation

## 2014-06-06 MED ORDER — HYDROCODONE-ACETAMINOPHEN 5-325 MG PO TABS: 1.0000 | ORAL_TABLET | ORAL | Status: DC | PRN

## 2014-06-06 MED ORDER — CYCLOBENZAPRINE HCL 10 MG PO TABS
5.0000 mg | ORAL_TABLET | Freq: Once | ORAL | Status: AC
  Administered 2014-06-06: 5 mg via ORAL
  Filled 2014-06-06: qty 1

## 2014-06-06 MED ORDER — IBUPROFEN 400 MG PO TABS
600.0000 mg | ORAL_TABLET | Freq: Once | ORAL | Status: AC
  Administered 2014-06-06: 600 mg via ORAL
  Filled 2014-06-06 (×2): qty 1

## 2014-06-06 MED ORDER — CYCLOBENZAPRINE HCL 10 MG PO TABS
5.0000 mg | ORAL_TABLET | Freq: Two times a day (BID) | ORAL | Status: DC | PRN
Start: 2014-06-06 — End: 2014-07-06

## 2014-06-06 MED ORDER — IBUPROFEN 600 MG PO TABS: 600.0000 mg | ORAL_TABLET | Freq: Four times a day (QID) | ORAL | Status: DC | PRN

## 2014-06-06 NOTE — ED Notes (Signed)
Declined W/C at D/C and was escorted to lobby by RN. 
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
## 2014-06-06 NOTE — Discharge Instructions (Signed)
Chronic Back Pain ° When back pain lasts longer than 3 months, it is called chronic back pain. People with chronic back pain often go through certain periods that are more intense (flare-ups).  °CAUSES °Chronic back pain can be caused by wear and tear (degeneration) on different structures in your back. These structures include: °· The bones of your spine (vertebrae) and the joints surrounding your spinal cord and nerve roots (facets). °· The strong, fibrous tissues that connect your vertebrae (ligaments). °Degeneration of these structures may result in pressure on your nerves. This can lead to constant pain. °HOME CARE INSTRUCTIONS °· Avoid bending, heavy lifting, prolonged sitting, and activities which make the problem worse. °· Take brief periods of rest throughout the day to reduce your pain. Lying down or standing usually is better than sitting while you are resting. °· Take over-the-counter or prescription medicines only as directed by your caregiver. °SEEK IMMEDIATE MEDICAL CARE IF:  °· You have weakness or numbness in one of your legs or feet. °· You have trouble controlling your bladder or bowels. °· You have nausea, vomiting, abdominal pain, shortness of breath, or fainting. °Document Released: 12/28/2004 Document Revised: 02/12/2012 Document Reviewed: 11/04/2011 °ExitCare® Patient Information ©2015 [employer]. This information is not intended to replace advice given to you by your health care provider. Make sure you discuss any questions you have with your health care provider. ° °Back Pain, Adult °Low back pain is very common. About 1 in 5 people have back pain. The cause of low back pain is rarely dangerous. The pain often gets better over time. About half of people with a sudden onset of back pain feel better in just 2 weeks. About 8 in 10 people feel better by 6 weeks.  °CAUSES °Some common causes of back pain include: °· Strain of the muscles or ligaments supporting the spine. °· Wear and tear  (degeneration) of the spinal discs. °· Arthritis. °· Direct injury to the back. °DIAGNOSIS °Most of the time, the direct cause of low back pain is not known. However, back pain can be treated effectively even when the exact cause of the pain is unknown. Answering your caregiver's questions about your overall health and symptoms is one of the most accurate ways to make sure the cause of your pain is not dangerous. If your caregiver needs more information, he or she may order lab work or imaging tests (X-rays or MRIs). However, even if imaging tests show changes in your back, this usually does not require surgery. °HOME CARE INSTRUCTIONS °For many people, back pain returns. Since low back pain is rarely dangerous, it is often a condition that people can learn to manage on their own.  °· Remain active. It is stressful on the back to sit or stand in one place. Do not sit, drive, or stand in one place for more than 30 minutes at a time. Take short walks on level surfaces as soon as pain allows. Try to increase the length of time you walk each day. °· Do not stay in bed. Resting more than 1 or 2 days can delay your recovery. °· Do not avoid exercise or work. Your body is made to move. It is not dangerous to be active, even though your back may hurt. Your back will likely heal faster if you return to being active before your pain is gone. °· Pay attention to your body when you  bend and lift. Many people have less discomfort when lifting if they bend their knees, keep the load close to their bodies, and avoid twisting. Often, the most comfortable   positions are those that put less stress on your recovering back.  Find a comfortable position to sleep. Use a firm mattress and lie on your side with your knees slightly bent. If you lie on your back, put a pillow under your knees.  Only take over-the-counter or prescription medicines as directed by your caregiver. Over-the-counter medicines to reduce pain and inflammation  are often the most helpful.Your caregiver may prescribe muscle relaxant drugs.These medicines help dull your pain so you can more quickly return to your normal activities and healthy exercise.  Put ice on the injured area.  Put ice in a plastic bag.  Place a towel between your skin and the bag.  Leave the ice on for 15-20 minutes, 03-04 times a day for the first 2 to 3 days. After that, ice and heat may be alternated to reduce pain and spasms.  Ask your caregiver about trying back exercises and gentle massage. This may be of some benefit.  Avoid feeling anxious or stressed.Stress increases muscle tension and can worsen back pain.It is important to recognize when you are anxious or stressed and learn ways to manage it.Exercise is a great option. SEEK MEDICAL CARE IF:  You have pain that is not relieved with rest or medicine.  You have pain that does not improve in 1 week.  You have new symptoms.  You are generally not feeling well. SEEK IMMEDIATE MEDICAL CARE IF:   You have pain that radiates from your back into your legs.  You develop new bowel or bladder control problems.  You have unusual weakness or numbness in your arms or legs.  You develop nausea or vomiting.  You develop abdominal pain.  You feel faint. Document Released: 11/20/2005 Document Revised: 05/21/2012 Document Reviewed: 04/10/2011 [facility] Patient Information 2015 [place], [place]. This information is not intended to replace advice given to you by your health care provider. Make sure you discuss any questions you have with your health care provider.

## 2014-06-07 NOTE — ED Provider Notes (Signed)
Medical screening examination/treatment/procedure(s) were performed by non-physician practitioner and as supervising physician I was immediately available for consultation/collaboration.  Jasani Lengel E Umair Rosiles, MD 06/07/14 1219 

## 2014-07-03 ENCOUNTER — Encounter (HOSPITAL_COMMUNITY): Payer: Self-pay | Admitting: Emergency Medicine

## 2014-07-03 ENCOUNTER — Emergency Department (HOSPITAL_COMMUNITY)
Admission: EM | Admit: 2014-07-03 | Discharge: 2014-07-03 | Disposition: A | Discharge status: Home / Self Care | Payer: No Typology Code available for payment source | Attending: Emergency Medicine | Admitting: Emergency Medicine

## 2014-07-03 DIAGNOSIS — M545 Low back pain, unspecified: Secondary | ICD-10-CM

## 2014-07-03 DIAGNOSIS — F319 Bipolar disorder, unspecified: Secondary | ICD-10-CM | POA: Insufficient documentation

## 2014-07-03 DIAGNOSIS — J45909 Unspecified asthma, uncomplicated: Secondary | ICD-10-CM | POA: Insufficient documentation

## 2014-07-03 DIAGNOSIS — Z79899 Other long term (current) drug therapy: Secondary | ICD-10-CM | POA: Insufficient documentation

## 2014-07-03 DIAGNOSIS — M549 Dorsalgia, unspecified: Secondary | ICD-10-CM | POA: Insufficient documentation

## 2014-07-03 DIAGNOSIS — F17200 Nicotine dependence, unspecified, uncomplicated: Secondary | ICD-10-CM | POA: Insufficient documentation

## 2014-07-03 DIAGNOSIS — I10 Essential (primary) hypertension: Secondary | ICD-10-CM | POA: Insufficient documentation

## 2014-07-03 MED ORDER — HYDROCODONE-ACETAMINOPHEN 5-325 MG PO TABS: 1.0000 | ORAL_TABLET | ORAL | Status: DC | PRN

## 2014-07-03 MED ORDER — CYCLOBENZAPRINE HCL 10 MG PO TABS: 10.0000 mg | ORAL_TABLET | Freq: Two times a day (BID) | ORAL | Status: DC | PRN

## 2014-07-03 NOTE — ED Provider Notes (Signed)
Medical screening examination/treatment/procedure(s) were performed by non-physician practitioner and as supervising physician I was immediately available for consultation/collaboration.   EKG Interpretation None        Junius ArgyleForrest S Sendy Pluta, MD 07/03/14 1640

## 2014-07-03 NOTE — ED Notes (Signed)
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   Patient states he is "almost out of pain medication".

## 2014-07-03 NOTE — Discharge Instructions (Signed)
Take Vicodin as needed for pain. Take Flexeril as needed for muscle spasm. Follow up with a primary care provider. Follow up with the health department.

## 2014-07-03 NOTE — Progress Notes (Signed)
  CARE MANAGEMENT ED NOTE 07/03/2014  Patient:  Trachtenberg,Mandeep   Account Number:  [id number]  Date Initiated:  07/03/2014  Documentation initiated by:  Ferdinand CavaSCHETTINO,Chyane Greer  Subjective/Objective Assessment:   58 yo male presenting to the ED with c/o pain     Subjective/Objective Assessment Detail:     Action/Plan:   Patient will follow up with with [facility] and community resources   Action/Plan Detail:   Anticipated DC Date:       Status Recommendation to Physician:   Result of Recommendation:  Agreed    DC Planning Services  CM consult  Other  PCP issues    Choice offered to / List presented to:  C-1 Patient          Status of service:  Completed, signed off  ED Comments:   ED Comments Detail:  CM consulted for PCP assist. This CM spoke with the patient regarding his insurance status. The patient stated that he has applied for Medicaid and it is pending. He also stated that he has recently qualified for [facility] and medical benefits which will start in December. Discussed the orange card with the patient as a bridge to other services. Will provide information to [facility] to follow up with patient to connect to PCP and pharmacy. ED PA made aware of information provided. Patient had no other questions or concerns. Resource list for [facility] was also provided to patient.

## 2014-07-03 NOTE — ED Provider Notes (Signed)
CSN: [id number]     Arrival date & time 07/03/14  1126 History  This chart was scribed for non-physician practitioner, Emilia Beck, PA-C working with Junius Argyle, MD by Greggory Stallion, ED scribe. This patient was seen in room TR08C/TR08C and the patient's care was started at 12:19 PM.   Chief Complaint  Patient presents with  . Back Pain   The history is provided by the patient. No language interpreter was used.   HPI Comments: Ricardo Brooks is a 58 y.o. male who presents to the Emergency Department complaining of chronic lower back pain that worsened yesterday when he was "jostling on the bus". Denies new injury. Certain movements worsen the pain. Pt has taken motrin with no relief. Denies trouble walking.   Past Medical History  Diagnosis Date  . Asthma   . Hypertension   . Bipolar 1 disorder    Past Surgical History  Procedure Laterality Date  . Orif right ankle Right 1985   No family history on file. History  Substance Use Topics  . Smoking status: Current Every Day Smoker -- 0.50 packs/day for 18 years    Types: Cigarettes  . Smokeless tobacco: Not on file  . Alcohol Use: Yes     Comment: 2 drinks weekly     Review of Systems  Musculoskeletal: Positive for back pain.  All other systems reviewed and are negative.  Allergies  Methocarbamol  Home Medications   Prior to Admission medications   Medication Sig Start Date End Date Taking? Authorizing Provider  acetaminophen (TYLENOL) 500 MG tablet Take 1,000 mg by mouth as needed for pain.     Historical Provider, MD  ARIPiprazole (ABILIFY) 5 MG tablet Take 5 mg by mouth daily.    Historical Provider, MD  cyclobenzaprine (FLEXERIL) 10 MG tablet Take 0.5-1 tablets (5-10 mg total) by mouth 2 (two) times daily as needed for muscle spasms. 06/06/14   Dorthula Matas, PA-C  HYDROcodone-acetaminophen (NORCO/VICODIN) 5-325 MG per tablet Take 1-2 tablets by mouth every 4 (four) hours as needed. 06/06/14   Tiffany Irine Seal,  PA-C  ibuprofen (ADVIL,MOTRIN) 200 MG tablet Take 400 mg by mouth once.    Historical Provider, MD  ibuprofen (ADVIL,MOTRIN) 600 MG tablet Take 1 tablet (600 mg total) by mouth every 6 (six) hours as needed. 06/06/14   Tiffany Irine Seal, PA-C  loratadine (CLARITIN) 10 MG tablet Take 10 mg by mouth daily. 09/09/13   [name], MD  permethrin (ELIMITE) 5 % cream Apply topically once. 09/09/13   [name], MD   BP 143/75  Pulse 55  Temp(Src) 98.2 F (36.8 C) (Oral)  Resp 18  SpO2 100%  Physical Exam  Nursing note and vitals reviewed. Constitutional: He is oriented to person, place, and time. He appears well-developed and well-nourished. No distress.  HENT:  Head: Normocephalic and atraumatic.  Eyes: Conjunctivae and EOM are normal.  Neck: Neck supple. No tracheal deviation present.  Cardiovascular: Normal rate.   Pulmonary/Chest: Effort normal. No respiratory distress.  Musculoskeletal: Normal range of motion.  Right lumbar paraspinal tenderness to palpation. No midline spine tenderness.  Neurological: He is alert and oriented to person, place, and time.  Lower extremity strength and sensation equal and intact.  Skin: Skin is warm and dry.  Psychiatric: He has a normal mood and affect. His behavior is normal.    ED Course  Procedures (including critical care time)  DIAGNOSTIC STUDIES: Oxygen Saturation is 100% on RA, normal by my interpretation.  COORDINATION OF CARE: 12:20 PM-Discussed treatment plan which includes pain medication with pt at bedside and pt agreed to plan.   Labs Review Labs Reviewed - No data to display  Imaging Review No results found.   EKG Interpretation None      MDM   Final diagnoses:  Left-sided low back pain without sciatica    12:24 PM Patient's has exacerbation of chronic low back pain. No bladder/bowel incontinence or saddle paresthesias. Vitals stable and patient afebrile. Case management spoke with the patient about PCP follow up and  provided resources.   I personally performed the services described in this documentation, which was scribed in my presence. The recorded information has been reviewed and is accurate.  Emilia BeckKaitlyn Zyniah Ferraiolo, PA-C 07/03/14 1225

## 2014-07-06 ENCOUNTER — Emergency Department (HOSPITAL_COMMUNITY)
Admission: EM | Admit: 2014-07-06 | Discharge: 2014-07-06 | Disposition: A | Discharge status: Home / Self Care | Payer: No Typology Code available for payment source | Attending: Emergency Medicine | Admitting: Emergency Medicine

## 2014-07-06 ENCOUNTER — Encounter (HOSPITAL_COMMUNITY): Payer: Self-pay | Admitting: Emergency Medicine

## 2014-07-06 DIAGNOSIS — Z760 Encounter for issue of repeat prescription: Secondary | ICD-10-CM | POA: Insufficient documentation

## 2014-07-06 DIAGNOSIS — M5441 Lumbago with sciatica, right side: Secondary | ICD-10-CM

## 2014-07-06 DIAGNOSIS — Z79899 Other long term (current) drug therapy: Secondary | ICD-10-CM | POA: Insufficient documentation

## 2014-07-06 DIAGNOSIS — F17200 Nicotine dependence, unspecified, uncomplicated: Secondary | ICD-10-CM | POA: Insufficient documentation

## 2014-07-06 DIAGNOSIS — J45909 Unspecified asthma, uncomplicated: Secondary | ICD-10-CM | POA: Insufficient documentation

## 2014-07-06 DIAGNOSIS — I10 Essential (primary) hypertension: Secondary | ICD-10-CM | POA: Insufficient documentation

## 2014-07-06 DIAGNOSIS — G8929 Other chronic pain: Secondary | ICD-10-CM | POA: Insufficient documentation

## 2014-07-06 DIAGNOSIS — F319 Bipolar disorder, unspecified: Secondary | ICD-10-CM | POA: Insufficient documentation

## 2014-07-06 DIAGNOSIS — Z791 Long term (current) use of non-steroidal anti-inflammatories (NSAID): Secondary | ICD-10-CM | POA: Insufficient documentation

## 2014-07-06 DIAGNOSIS — M5430 Sciatica, unspecified side: Secondary | ICD-10-CM | POA: Insufficient documentation

## 2014-07-06 MED ORDER — TRAMADOL HCL 50 MG PO TABS: 50.0000 mg | ORAL_TABLET | Freq: Four times a day (QID) | ORAL | Status: DC | PRN

## 2014-07-06 NOTE — Discharge Instructions (Signed)
Please read and follow all provided instructions.  Your diagnoses today include:  1. Right-sided low back pain with right-sided sciatica     Tests performed today include:  Vital signs - see below for your results today  Medications prescribed:   Tramadol - narcotic-like pain medication  DO NOT drive or perform any activities that require you to be awake and alert because this medicine can make you drowsy.   Take any prescribed medications only as directed.  Home care instructions:   Follow any educational materials contained in this packet  Please rest, use ice or heat on your back for the next several days  Do not lift, push, pull anything more than 10 pounds for the next week  Follow-up instructions: Please follow-up with your primary care provider in the next 1 week for further evaluation of your symptoms.   Return instructions:  SEEK IMMEDIATE MEDICAL ATTENTION IF YOU HAVE:  New numbness, tingling, weakness, or problem with the use of your arms or legs  Severe back pain not relieved with medications  Loss control of your bowels or bladder  Increasing pain in any areas of the body (such as chest or abdominal pain)  Shortness of breath, dizziness, or fainting.   Worsening nausea (feeling sick to your stomach), vomiting, fever, or sweats  Any other emergent concerns regarding your health   Additional Information:  Your vital signs today were: BP 138/77   Pulse 58   Temp(Src) 98.2 F (36.8 C) (Oral)   Resp 18   Ht 5' 9.5" (1.765 m)   Wt 150 lb (68.04 kg)   BMI 21.84 kg/m2   SpO2 99% If your blood pressure (BP) was elevated above 135/85 this visit, please have this repeated by your doctor within one month. --------------

## 2014-07-06 NOTE — ED Notes (Signed)
Declined W/C at D/C and was escorted to lobby by RN. 

## 2014-07-06 NOTE — Progress Notes (Signed)
[facility] [employer]iaison Stacy,  Provided pt with a list of primary care resources to help patient establish primary care. Patient stated that he was currently pending Disability/Medicaid and VA benefits.

## 2014-07-06 NOTE — ED Provider Notes (Signed)
Medical screening examination/treatment/procedure(s) were performed by non-physician practitioner and as supervising physician I was immediately available for consultation/collaboration.  Tinsley Everman L Audyn Dimercurio, MD 07/06/14 1713 

## 2014-07-06 NOTE — ED Provider Notes (Signed)
CSN: [id number]     Arrival date & time 07/06/14  0923 History  This chart was scribed for non-physician practitioner, Rhea BleacherJosh Johnmichael Melhorn, PA-C working with Flint MelterElliott L Wentz, MD, by Jarvis Morganaylor Ferguson, ED Scribe. This patient was seen in room TR10C/TR10C and the patient's care was started at 10:11 AM.   Chief Complaint  Patient presents with  . Back Pain  . Medication Refill     The history is provided by the patient. No language interpreter was used.   HPI Comments: Ricardo Brooks is a 58 y.o. male with a h/o Asthma, HTN and bipolar 1 disorder who presents to the Emergency Department for a medication refill. He states he has previously been taking Vicodin for his chronic back pain but states that he is having to take too many and feels like it has been affecting his breathing. He was last seen in the ED for his chronic back pain on 07/03/14. He states that he is currently having "tingling, burning", "7/10", right sided back pain that radiates to his bilateral legs. He states that sometimes it even causes his right leg to "give out". He states he has also been prescribed Flexeril but has been unable to afford it. Patient also notes that he has not been able to see a physician for chronic pain management due to financial constraints. He denies any weight loss, fever, chills, urinary or bowel incontinence.    Past Medical History  Diagnosis Date  . Asthma   . Hypertension   . Bipolar 1 disorder    Past Surgical History  Procedure Laterality Date  . Orif right ankle Right 1985   History reviewed. No pertinent family history. History  Substance Use Topics  . Smoking status: Current Every Day Smoker -- 0.50 packs/day for 18 years    Types: Cigarettes  . Smokeless tobacco: Not on file  . Alcohol Use: Yes     Comment: 2 drinks weekly     Review of Systems  Constitutional: Negative for fever, chills and unexpected weight change.  Gastrointestinal: Negative for constipation.       No bowel  incontinence  Genitourinary: Negative for hematuria, flank pain and difficulty urinating.       No urinary incontinence  Musculoskeletal: Positive for back pain (right sided).  Neurological: Negative for weakness and numbness.       Negative for saddle paresthesias       Allergies  Methocarbamol  Home Medications   Prior to Admission medications   Medication Sig Start Date End Date Taking? Authorizing Provider  acetaminophen (TYLENOL) 500 MG tablet Take 1,000 mg by mouth as needed for pain.     Historical Provider, MD  ARIPiprazole (ABILIFY) 5 MG tablet Take 5 mg by mouth daily.    Historical Provider, MD  cyclobenzaprine (FLEXERIL) 10 MG tablet Take 0.5-1 tablets (5-10 mg total) by mouth 2 (two) times daily as needed for muscle spasms. 06/06/14   Tiffany Irine SealG Greene, PA-C  cyclobenzaprine (FLEXERIL) 10 MG tablet Take 1 tablet (10 mg total) by mouth 2 (two) times daily as needed for muscle spasms. 07/03/14   Emilia BeckKaitlyn Szekalski, PA-C  HYDROcodone-acetaminophen (NORCO/VICODIN) 5-325 MG per tablet Take 1-2 tablets by mouth every 4 (four) hours as needed. 06/06/14   Dorthula Matasiffany G Greene, PA-C  HYDROcodone-acetaminophen (NORCO/VICODIN) 5-325 MG per tablet Take 1-2 tablets by mouth every 4 (four) hours as needed for moderate pain or severe pain. 07/03/14   Kaitlyn Szekalski, PA-C  ibuprofen (ADVIL,MOTRIN) 200 MG tablet Take 400 mg by  mouth once.    Historical Provider, MD  ibuprofen (ADVIL,MOTRIN) 600 MG tablet Take 1 tablet (600 mg total) by mouth every 6 (six) hours as needed. 06/06/14   Tiffany Irine Seal, PA-C  loratadine (CLARITIN) 10 MG tablet Take 10 mg by mouth daily. 09/09/13   [name], MD  permethrin (ELIMITE) 5 % cream Apply topically once. 09/09/13   [name], MD   Triage Vitals: BP 138/77  Pulse 58  Temp(Src) 98.2 F (36.8 C) (Oral)  Resp 18  Ht 5' 9.5" (1.765 m)  Wt 150 lb (68.04 kg)  BMI 21.84 kg/m2  SpO2 99%  Physical Exam  Nursing note and vitals reviewed. Constitutional: He appears  well-developed and well-nourished. No distress.  HENT:  Head: Normocephalic and atraumatic.  Eyes: Conjunctivae and EOM are normal.  Neck: Normal range of motion. Neck supple. No tracheal deviation present.  Cardiovascular: Normal rate.   Pulmonary/Chest: Effort normal. No respiratory distress.  Abdominal: Soft. There is no tenderness. There is no CVA tenderness.  Musculoskeletal: Normal range of motion. He exhibits no tenderness.       Cervical back: He exhibits normal range of motion, no tenderness and no bony tenderness.       Thoracic back: He exhibits normal range of motion, no tenderness and no bony tenderness.       Lumbar back: He exhibits bony tenderness. He exhibits normal range of motion and no tenderness.       Back:  No step-off noted with palpation of spine.   Neurological: He is alert. He has normal reflexes. No sensory deficit. He exhibits normal muscle tone.  5/5 strength in entire lower extremities bilaterally. No sensation deficit.   Skin: Skin is warm and dry.  Psychiatric: He has a normal mood and affect. His behavior is normal.    ED Course  Procedures (including critical care time)  DIAGNOSTIC STUDIES: Oxygen Saturation is 99% on RA, normal by my interpretation.    COORDINATION OF CARE: 10:19 AM- Will discharge patient with Ultram and advised patient to follow up with a physician that can manage his pain on a consistent basis. Pt advised of plan for treatment and pt agrees.   Labs Review Labs Reviewed - No data to display  Imaging Review No results found.   EKG Interpretation None      Patient seen and examined.   Vital signs reviewed and are as follows: Filed Vitals:   07/06/14 0930  BP: 138/77  Pulse: 58  Temp: 98.2 F (36.8 C)  Resp: 18   No red flag s/s of low back pain. Patient was counseled on back pain precautions and told to do activity as tolerated but do not lift, push, or pull heavy objects more than 10 pounds for the next week.   Patient counseled to use ice or heat on back for no longer than 15 minutes every hour.   Patient prescribed narcotic pain medicine and counseled on proper use of narcotic pain medications. Counseled not to combine this medication with others containing tylenol.   Urged patient not to drink alcohol, drive, or perform any other activities that requires focus while taking either of these medications.  Patient urged to follow-up with PCP if pain does not improve with treatment and rest or if pain becomes recurrent. Urged to return with worsening severe pain, loss of bowel or bladder control, trouble walking.   The patient verbalizes understanding and agrees with the plan.  MDM   Final diagnoses:  Right-sided low  back pain with right-sided sciatica   Pt with chronic back pain, unchanged, no neurological deficits. Patient is under the impression that his pain is being managed by the ED. I have discussed that he needs to have a single physician manage his pain long-term. Tramadol given today. Refused Vicodin/Percocet. Patient does not want to fill muscle relaxer because it makes him walk into things and doesn't like how he feel.   Patient is ambulatory. No warning symptoms of back pain including: loss of bowel or bladder control, night sweats, waking from sleep with back pain, unexplained fevers or weight loss, h/o cancer, IVDU, recent trauma. No concern for cauda equina, epidural abscess, or other serious cause of back pain. Conservative measures such as rest, ice/heat and pain medicine indicated with PCP follow-up if no improvement with conservative management.   I personally performed the services described in this documentation, which was scribed in my presence. The recorded information has been reviewed and is accurate.    [name], PA-C 07/06/14 1031

## 2014-07-06 NOTE — ED Notes (Signed)
Pt sts paperwork needst o be corrected for court date to state right back not left back pain, and also requests a higher Rx amt so he doesn't have to take as many since it affects his breathing if he takes to many, was seen on 7/31

## 2014-07-08 ENCOUNTER — Emergency Department (HOSPITAL_COMMUNITY)
Admission: EM | Admit: 2014-07-08 | Discharge: 2014-07-08 | Disposition: A | Discharge status: Home / Self Care | Payer: No Typology Code available for payment source | Attending: Emergency Medicine | Admitting: Emergency Medicine

## 2014-07-08 ENCOUNTER — Encounter (HOSPITAL_COMMUNITY): Payer: Self-pay | Admitting: Emergency Medicine

## 2014-07-08 DIAGNOSIS — Z79899 Other long term (current) drug therapy: Secondary | ICD-10-CM | POA: Insufficient documentation

## 2014-07-08 DIAGNOSIS — G8929 Other chronic pain: Secondary | ICD-10-CM | POA: Insufficient documentation

## 2014-07-08 DIAGNOSIS — Z791 Long term (current) use of non-steroidal anti-inflammatories (NSAID): Secondary | ICD-10-CM | POA: Insufficient documentation

## 2014-07-08 DIAGNOSIS — Z202 Contact with and (suspected) exposure to infections with a predominantly sexual mode of transmission: Secondary | ICD-10-CM | POA: Insufficient documentation

## 2014-07-08 DIAGNOSIS — R300 Dysuria: Secondary | ICD-10-CM | POA: Insufficient documentation

## 2014-07-08 DIAGNOSIS — M549 Dorsalgia, unspecified: Secondary | ICD-10-CM

## 2014-07-08 DIAGNOSIS — J45909 Unspecified asthma, uncomplicated: Secondary | ICD-10-CM | POA: Insufficient documentation

## 2014-07-08 DIAGNOSIS — F319 Bipolar disorder, unspecified: Secondary | ICD-10-CM | POA: Insufficient documentation

## 2014-07-08 DIAGNOSIS — I10 Essential (primary) hypertension: Secondary | ICD-10-CM | POA: Insufficient documentation

## 2014-07-08 DIAGNOSIS — M545 Low back pain, unspecified: Secondary | ICD-10-CM | POA: Insufficient documentation

## 2014-07-08 DIAGNOSIS — F17200 Nicotine dependence, unspecified, uncomplicated: Secondary | ICD-10-CM | POA: Insufficient documentation

## 2014-07-08 MED ORDER — AZITHROMYCIN 250 MG PO TABS
1000.0000 mg | ORAL_TABLET | Freq: Once | ORAL | Status: AC
  Administered 2014-07-08: 1000 mg via ORAL
  Filled 2014-07-08: qty 4

## 2014-07-08 MED ORDER — LIDOCAINE HCL 1 % IJ SOLN: 0.9000 mL | Freq: Once | INTRAMUSCULAR | Status: DC

## 2014-07-08 MED ORDER — CEFTRIAXONE SODIUM 250 MG IJ SOLR
250.0000 mg | Freq: Once | INTRAMUSCULAR | Status: AC
  Administered 2014-07-08: 250 mg via INTRAMUSCULAR
  Filled 2014-07-08: qty 250

## 2014-07-08 NOTE — Discharge Instructions (Signed)
Follow up with your Primary Care Provider. You will no longer receive pain medication in the ED.

## 2014-07-08 NOTE — ED Provider Notes (Signed)
CSN: [id number]     Arrival date & time 07/08/14  1146 History  This chart was scribed [name], PA-C, working with Merrie RoofJohn Pheng Wofford III, MD, by Leona CarryG. Clay Sherrill, ED Scribe. The patient was seen in TR07C/TR07C. The patient's care was started at 12:09 PM.     Chief Complaint  Patient presents with  . rx refill    The history is provided by the patient. No language interpreter was used.   HPI Comments: Ricardo CabalDavid Brooks is a 58 y.o. male with a history of chronic back pain and STD exposure who presents to the Emergency Department complaining of requesting a prescription for narcotics. Patient was given a prescription for Tramadol, but reports that this medication is not effective for him. He states that he took Tramadol that he bough "off the street" and it made him feel drunk for 6 hours. Patient reports that hydrocodone has been effective in the past for managing his chronic pain, but states the he took his last two pills yesterday.  Patient also complains of unrelated dysuria beginning three days ago.   Patient does not have a PCP.   Past Medical History  Diagnosis Date  . Asthma   . Hypertension   . Bipolar 1 disorder    Past Surgical History  Procedure Laterality Date  . Orif right ankle Right 1985   No family history on file. History  Substance Use Topics  . Smoking status: Current Every Day Smoker -- 0.50 packs/day for 18 years    Types: Cigarettes  . Smokeless tobacco: Not on file  . Alcohol Use: Yes     Comment: 2 drinks weekly     Review of Systems  Constitutional: Negative for fever, chills and fatigue.  HENT: Negative for trouble swallowing.   Eyes: Negative for visual disturbance.  Respiratory: Negative for shortness of breath.   Cardiovascular: Negative for chest pain and palpitations.  Gastrointestinal: Negative for nausea, vomiting, abdominal pain and diarrhea.  Genitourinary: Positive for dysuria. Negative for difficulty urinating.  Musculoskeletal:  Positive for back pain. Negative for arthralgias and neck pain.  Skin: Negative for color change.  Neurological: Negative for dizziness and weakness.  Psychiatric/Behavioral: Negative for dysphoric mood.      Allergies  Methocarbamol  Home Medications   Prior to Admission medications   Medication Sig Start Date End Date Taking? Authorizing Provider  acetaminophen (TYLENOL) 500 MG tablet Take 1,000 mg by mouth every 6 (six) hours as needed for mild pain.     Historical Provider, MD  ARIPiprazole (ABILIFY) 15 MG tablet Take 7.5 mg by mouth daily.    Historical Provider, MD  cyclobenzaprine (FLEXERIL) 10 MG tablet Take 1 tablet (10 mg total) by mouth 2 (two) times daily as needed for muscle spasms. 07/03/14   Emilia BeckKaitlyn [name], PA-C  HYDROcodone-acetaminophen (NORCO/VICODIN) 5-325 MG per tablet Take 1-2 tablets by mouth every 4 (four) hours as needed for moderate pain or severe pain. 07/03/14   [name], PA-C  ibuprofen (ADVIL,MOTRIN) 600 MG tablet Take 600 mg by mouth every 6 (six) hours as needed for moderate pain.    Historical Provider, MD  loratadine (CLARITIN) 10 MG tablet Take 10 mg by mouth daily as needed for allergies.  09/09/13   Md Linwood DibblesJon Knapp, MD  traMADol (ULTRAM) 50 MG tablet Take 1 tablet (50 mg total) by mouth every 6 (six) hours as needed. 07/06/14   Renne CriglerJoshua Geiple, PA-C   Triage Vitals: BP 151/90  Pulse 87  Temp(Src) 98.7 F (37.1  C) (Oral)  Resp 22  SpO2 99% Physical Exam  Nursing note and vitals reviewed. Constitutional: He appears well-developed and well-nourished. No distress.  HENT:  Head: Normocephalic and atraumatic.  Eyes: Conjunctivae are normal.  Neck: Normal range of motion.  Cardiovascular: Normal rate and regular rhythm.  Exam reveals no gallop and no friction rub.   No murmur heard. Pulmonary/Chest: Effort normal and breath sounds normal. He has no wheezes. He has no rales. He exhibits no tenderness.  Abdominal: Soft. He exhibits no distension.  There is no tenderness. There is no rebound.  Genitourinary:  Patient deferred.   Musculoskeletal: Normal range of motion.  No midline spine tenderness to palpation. Lumbar paraspinal tenderness to palpation.   Neurological: He is alert. Coordination normal.  Speech is goal-oriented. Moves limbs without ataxia.   Skin: Skin is warm and dry.  Psychiatric: He has a normal mood and affect. His behavior is normal.    ED Course  Procedures (including critical care time) DIAGNOSTIC STUDIES: Oxygen Saturation is 99% on room air, normal by my interpretation.    COORDINATION OF CARE: 12:15 PM-Discussed treatment plan which includes Rocephin and Zithromax with pt at bedside and pt agreed to plan.     Labs Review Labs Reviewed - No data to display  Imaging Review No results found.   EKG Interpretation None      MDM   Final diagnoses:  Chronic back pain  STD exposure    12:19 PM Patient has been seen here multiple times for his chronic back pain. Patient instructed to follow up with PCP which he has not done. Case management spoke with the patient and gave him resources for primary care. Patient reports buying pain medication off the street. I explained to the patient I will not give him pain medication since he buys/sells medication on the street. Patient has the prescription of Tramadol that was written 2 days ago. Patient instructed not to return to the ED for chronic back pain. No bladder/bowel incontinence or saddle paresthesias. Patient will have IM rocephin and azithromycin for STD exposure.   I personally performed the services described in this documentation, which was scribed in my presence. The recorded information has been reviewed and is accurate.    Emilia Beck, PA-C 07/08/14 1221

## 2014-07-08 NOTE — ED Notes (Signed)
Patient states tramadol doesn't work for him and requesting narcotics.   Patient states that he brought in his prescription (he never got filled).

## 2014-07-08 NOTE — ED Provider Notes (Signed)
Medical screening examination/treatment/procedure(s) were performed by non-physician practitioner and as supervising physician I was immediately available for consultation/collaboration.   EKG Interpretation None        Merrie RoofJohn Dann Maribel Luis III, MD 07/08/14 2222

## 2014-07-09 ENCOUNTER — Emergency Department (HOSPITAL_COMMUNITY)
Admission: EM | Admit: 2014-07-09 | Discharge: 2014-07-09 | Disposition: A | Discharge status: Home / Self Care | Payer: No Typology Code available for payment source | Attending: Emergency Medicine | Admitting: Emergency Medicine

## 2014-07-09 ENCOUNTER — Encounter (HOSPITAL_COMMUNITY): Payer: Self-pay | Admitting: Emergency Medicine

## 2014-07-09 DIAGNOSIS — I10 Essential (primary) hypertension: Secondary | ICD-10-CM | POA: Insufficient documentation

## 2014-07-09 DIAGNOSIS — G8929 Other chronic pain: Secondary | ICD-10-CM | POA: Insufficient documentation

## 2014-07-09 DIAGNOSIS — Z79899 Other long term (current) drug therapy: Secondary | ICD-10-CM | POA: Insufficient documentation

## 2014-07-09 DIAGNOSIS — F319 Bipolar disorder, unspecified: Secondary | ICD-10-CM | POA: Insufficient documentation

## 2014-07-09 DIAGNOSIS — M549 Dorsalgia, unspecified: Secondary | ICD-10-CM | POA: Insufficient documentation

## 2014-07-09 DIAGNOSIS — Z760 Encounter for issue of repeat prescription: Secondary | ICD-10-CM | POA: Insufficient documentation

## 2014-07-09 DIAGNOSIS — F17200 Nicotine dependence, unspecified, uncomplicated: Secondary | ICD-10-CM | POA: Insufficient documentation

## 2014-07-09 DIAGNOSIS — J45909 Unspecified asthma, uncomplicated: Secondary | ICD-10-CM | POA: Insufficient documentation

## 2014-07-09 MED ORDER — ONDANSETRON HCL 4 MG PO TABS: 4.0000 mg | ORAL_TABLET | Freq: Four times a day (QID) | ORAL | Status: DC

## 2014-07-09 MED ORDER — ONDANSETRON 4 MG PO TBDP
4.0000 mg | ORAL_TABLET | Freq: Once | ORAL | Status: AC
  Administered 2014-07-09: 4 mg via ORAL
  Filled 2014-07-09: qty 1

## 2014-07-09 MED ORDER — NAPROXEN 375 MG PO TABS: 375.0000 mg | ORAL_TABLET | Freq: Two times a day (BID) | ORAL | Status: DC

## 2014-07-09 MED ORDER — HYDROCODONE-ACETAMINOPHEN 5-325 MG PO TABS
1.0000 | ORAL_TABLET | Freq: Once | ORAL | Status: AC
  Administered 2014-07-09: 1 via ORAL
  Filled 2014-07-09: qty 1

## 2014-07-09 NOTE — Discharge Instructions (Signed)

## 2014-07-09 NOTE — ED Provider Notes (Signed)
CSN: [id number]     Arrival date & time 07/09/14  1215 History  This chart was scribed for [name], PA-C, working with Elwin Mocha, MD by Leona Carry, ED Scribe. The patient was seen in TR07C/TR07C. The patient's care was started at 12:45 PM.    Chief Complaint  Patient presents with  . Medication Refill   The history is provided by the patient. No language interpreter was used.   HPI Comments: Ricardo Brooks is a 58 y.o. male with a history of chronic right-sided back pain who presents to the Emergency Department for a medication refill. Patient visited the ED yesterday for the same requesting to be given a different medication for pain. He was given a prescription for Tramadol two days ago. He states that this is too expensive, and is requesting a prescription for hydrocodone, which he has taken in the past. He says that he doesn't feel like they work and requests Percocet's instead. He reports that he knows they work. Patient reports that he has an appointment at the [facility] on August 15 but is unable to be seen before then.   Patient does not have a PCP.   Past Medical History  Diagnosis Date  . Asthma   . Hypertension   . Bipolar 1 disorder    Past Surgical History  Procedure Laterality Date  . Orif right ankle Right 1985   History reviewed. No pertinent family history. History  Substance Use Topics  . Smoking status: Current Every Day Smoker -- 0.50 packs/day for 18 years    Types: Cigarettes  . Smokeless tobacco: Not on file  . Alcohol Use: Yes     Comment: 2 drinks weekly     Review of Systems  Musculoskeletal: Positive for back pain.  All other systems reviewed and are negative.     Allergies  Methocarbamol  Home Medications   Prior to Admission medications   Medication Sig Start Date End Date Taking? Authorizing Provider  acetaminophen (TYLENOL) 500 MG tablet Take 1,000 mg by mouth every 6 (six) hours as needed for mild pain.      Historical Provider, MD  ARIPiprazole (ABILIFY) 15 MG tablet Take 7.5 mg by mouth daily.    Historical Provider, MD  cyclobenzaprine (FLEXERIL) 10 MG tablet Take 1 tablet (10 mg total) by mouth 2 (two) times daily as needed for muscle spasms. 07/03/14   Emilia Beck, PA-C  HYDROcodone-acetaminophen (NORCO/VICODIN) 5-325 MG per tablet Take 1-2 tablets by mouth every 4 (four) hours as needed for moderate pain or severe pain. 07/03/14   Kaitlyn Szekalski, PA-C  ibuprofen (ADVIL,MOTRIN) 600 MG tablet Take 600 mg by mouth every 6 (six) hours as needed for moderate pain.    Historical Provider, MD  loratadine (CLARITIN) 10 MG tablet Take 10 mg by mouth daily as needed for allergies.  09/09/13   Md [name], MD  naproxen (NAPROSYN) 375 MG tablet Take 1 tablet (375 mg total) by mouth 2 (two) times daily. 07/09/14   Robert Sperl Irine Seal, PA-C  ondansetron (ZOFRAN) 4 MG tablet Take 1 tablet (4 mg total) by mouth every 6 (six) hours. 07/09/14   Annie Saephan Irine Seal, PA-C  traMADol (ULTRAM) 50 MG tablet Take 1 tablet (50 mg total) by mouth every 6 (six) hours as needed. 07/06/14   [name], PA-C   Triage Vitals: BP 153/78  Pulse 58  Temp(Src) 98.3 F (36.8 C) (Oral)  Resp 16  Wt 143 lb 14.4 oz (65.273 kg)  SpO2  97% Physical Exam  Nursing note and vitals reviewed. Constitutional: He is oriented to person, place, and time. He appears well-developed and well-nourished. No distress.  HENT:  Head: Normocephalic and atraumatic.  Eyes: Conjunctivae and EOM are normal.  Neck: Neck supple. No tracheal deviation present.  Cardiovascular: Normal rate.   Pulmonary/Chest: Effort normal. No respiratory distress.  Musculoskeletal: Normal range of motion.  Pt has equal strength to bilateral lower extremities.  Neurosensory function adequate to both legs No clonus on dorsiflextion Skin color is normal. Skin is warm and moist.  I see no step off deformity, no midline bony tenderness.  Pt is able to ambulate.  No  crepitus, laceration, effusion, induration, lesions, swelling.   Pedal pulses are symmetrical and palpable bilaterally  no tenderness to palpation of paraspinel or midline muscles or lumbar or thoracic spine.   Neurological: He is alert and oriented to person, place, and time.  Skin: Skin is warm and dry.  Psychiatric: He has a normal mood and affect. His behavior is normal.    ED Course  Procedures (including critical care time) DIAGNOSTIC STUDIES: Oxygen Saturation is 97% on room air, normal by my interpretation.    COORDINATION OF CARE:   Labs Review Labs Reviewed - No data to display  Imaging Review No results found.   EKG Interpretation None      MDM   Final diagnoses:  Chronic back pain    Patient was seen here yesterday asking for Percocets. Emilia Beck, PA-C told him that we do not treat chronic pain here and that he would not be receiving Rx narcotics from the hospital at ANY visit. For chronic pain. The patient is unhappy with this but says he understands.  See part of Kaitlyns note from 07/08/2014 " Patient has been seen here multiple times for his chronic back pain. Patient instructed to follow up with PCP which he has not done. Case management spoke with the patient and gave him resources for primary care. Patient reports buying pain medication off the street. I explained to the patient I will not give him pain medication since he buys/sells medication on the street. Patient has the prescription of Tramadol that was written 2 days ago. Patient instructed not to return to the ED for chronic back pain. No bladder/bowel incontinence or saddle paresthesias."    57 y.o.Ricardo Brooks  with back pain. No neurological deficits and normal neuro exam. Patient can walk. No loss of bowel or bladder control. No concern for cauda equina at this time base on HPI and physical exam findings. No fever, night sweats, weight loss, h/o cancer, IVDU.   RICE protocol and pain  medicine indicated and discussed with patient.   Patient Plan 1. Medications: pain medication and muscle relaxer. Cont usual home medications unless otherwise directed. 2. Treatment: rest, drink plenty of fluids, gentle stretching as discussed, alternate ice and heat  3. Follow Up: Please followup with your primary doctor for discussion of your diagnoses and further evaluation after today's visit; if you do not have a primary care doctor use the resource guide provided to find one   Vital signs are stable at discharge. Filed Vitals:   07/09/14 1302  BP: 148/75  Pulse: 51  Temp:   Resp: 18    Patient/guardian has voiced understanding and agreed to follow-up with the PCP or specialist.       I personally performed the services described in this documentation, which was scribed in my presence. The recorded information has  been reviewed and is accurate.    Dorthula Matasiffany G Natasja Niday, PA-C 07/09/14 1316

## 2014-07-09 NOTE — ED Notes (Signed)
Pt requesting tramadol prescription be changed to hydrocodone because tramadol is too expensive. Pt states he needs it for his chronic back pain. Pt awake, alert, orientedx4, ambulatory, NAD at present.

## 2014-07-09 NOTE — ED Notes (Signed)
Here because he cannot afford Ultram rx-- [facility]-- has chronic back pain,

## 2014-07-09 NOTE — ED Provider Notes (Signed)
Medical screening examination/treatment/procedure(s) were performed by non-physician practitioner and as supervising physician I was immediately available for consultation/collaboration.   EKG Interpretation None        Elwin MochaBlair Nataki Mccrumb, MD 07/09/14 1317

## 2014-07-24 ENCOUNTER — Encounter (HOSPITAL_COMMUNITY): Payer: Self-pay | Admitting: Emergency Medicine

## 2014-07-24 ENCOUNTER — Emergency Department (HOSPITAL_COMMUNITY)
Admission: EM | Admit: 2014-07-24 | Discharge: 2014-07-25 | Disposition: A | Discharge status: Home / Self Care | Payer: No Typology Code available for payment source | Attending: Emergency Medicine | Admitting: Emergency Medicine

## 2014-07-24 DIAGNOSIS — F17200 Nicotine dependence, unspecified, uncomplicated: Secondary | ICD-10-CM | POA: Insufficient documentation

## 2014-07-24 DIAGNOSIS — I10 Essential (primary) hypertension: Secondary | ICD-10-CM | POA: Insufficient documentation

## 2014-07-24 DIAGNOSIS — F319 Bipolar disorder, unspecified: Secondary | ICD-10-CM | POA: Insufficient documentation

## 2014-07-24 DIAGNOSIS — M545 Low back pain, unspecified: Secondary | ICD-10-CM | POA: Insufficient documentation

## 2014-07-24 DIAGNOSIS — J45909 Unspecified asthma, uncomplicated: Secondary | ICD-10-CM | POA: Insufficient documentation

## 2014-07-24 DIAGNOSIS — Z79899 Other long term (current) drug therapy: Secondary | ICD-10-CM | POA: Insufficient documentation

## 2014-07-24 DIAGNOSIS — G8929 Other chronic pain: Secondary | ICD-10-CM | POA: Insufficient documentation

## 2014-07-24 DIAGNOSIS — Z791 Long term (current) use of non-steroidal anti-inflammatories (NSAID): Secondary | ICD-10-CM | POA: Insufficient documentation

## 2014-07-24 NOTE — ED Notes (Signed)
Pt to ED c/o chronic low back pain; reports being seen here in ED two weeks ago for back pain and referred to "private doctor." Reports unable to pay for doctor right now. Also reports almost being of pain medications that were prescribed

## 2014-07-25 MED ORDER — IBUPROFEN 800 MG PO TABS: 800.0000 mg | ORAL_TABLET | Freq: Three times a day (TID) | ORAL | Status: DC

## 2014-07-25 NOTE — Discharge Instructions (Signed)
Take ibuprofen as needed for pain. Follow up with primary care provider as directed.

## 2014-07-25 NOTE — ED Provider Notes (Signed)
CSN: [id number]     Arrival date & time 07/24/14  2303 History   First MD Initiated Contact with Patient 07/25/14 0026     Chief Complaint  Patient presents with  . Back Pain     (Consider location/radiation/quality/duration/timing/severity/associated sxs/prior Treatment) HPI Comments: Patient is a 58 year old male with a past medical history of chronic back pain who presents with persistent lower back pain. The pain is aching and severe and does not radiate. The pain is constant. Movement makes the pain worse. Nothing makes the pain better. Patient has not tried anything for pain since he ran out of his pain medication. Patient has been instructed follow up with PCP which he has not done yet due to cost. No associated symptoms. No saddle paresthesias or bladder/bowel incontinence.     Patient is a 58 y.o. male presenting with back pain.  Back Pain Associated symptoms: no abdominal pain, no chest pain, no dysuria, no fever and no weakness     Past Medical History  Diagnosis Date  . Asthma   . Hypertension   . Bipolar 1 disorder    Past Surgical History  Procedure Laterality Date  . Orif right ankle Right 1985   History reviewed. No pertinent family history. History  Substance Use Topics  . Smoking status: Current Every Day Smoker -- 0.50 packs/day for 18 years    Types: Cigarettes  . Smokeless tobacco: Not on file  . Alcohol Use: Yes     Comment: 2 drinks weekly     Review of Systems  Constitutional: Negative for fever, chills and fatigue.  HENT: Negative for trouble swallowing.   Eyes: Negative for visual disturbance.  Respiratory: Negative for shortness of breath.   Cardiovascular: Negative for chest pain and palpitations.  Gastrointestinal: Negative for nausea, vomiting, abdominal pain and diarrhea.  Genitourinary: Negative for dysuria and difficulty urinating.  Musculoskeletal: Positive for back pain. Negative for arthralgias and neck pain.  Skin: Negative for  color change.  Neurological: Negative for dizziness and weakness.  Psychiatric/Behavioral: Negative for dysphoric mood.      Allergies  Methocarbamol  Home Medications   Prior to Admission medications   Medication Sig Start Date End Date Taking? Authorizing Provider  acetaminophen (TYLENOL) 500 MG tablet Take 1,000 mg by mouth every 6 (six) hours as needed for mild pain.     Historical Provider, MD  ARIPiprazole (ABILIFY) 15 MG tablet Take 7.5 mg by mouth daily.    Historical Provider, MD  cyclobenzaprine (FLEXERIL) 10 MG tablet Take 1 tablet (10 mg total) by mouth 2 (two) times daily as needed for muscle spasms. 07/03/14   Emilia Beck, PA-C  HYDROcodone-acetaminophen (NORCO/VICODIN) 5-325 MG per tablet Take 1-2 tablets by mouth every 4 (four) hours as needed for moderate pain or severe pain. 07/03/14   Siena Poehler, PA-C  ibuprofen (ADVIL,MOTRIN) 600 MG tablet Take 600 mg by mouth every 6 (six) hours as needed for moderate pain.    Historical Provider, MD  loratadine (CLARITIN) 10 MG tablet Take 10 mg by mouth daily as needed for allergies.  09/09/13   [name], MD  naproxen (NAPROSYN) 375 MG tablet Take 1 tablet (375 mg total) by mouth 2 (two) times daily. 07/09/14   Tiffany Irine Seal, PA-C  ondansetron (ZOFRAN) 4 MG tablet Take 1 tablet (4 mg total) by mouth every 6 (six) hours. 07/09/14   Tiffany Irine Seal, PA-C  traMADol (ULTRAM) 50 MG tablet Take 1 tablet (50 mg total) by mouth every  6 (six) hours as needed. 07/06/14   Renne CriglerJoshua Geiple, PA-C   BP 132/77  Pulse 64  Temp(Src) 97.8 F (36.6 C) (Oral)  Resp 16  Ht 5\' 9"  (1.753 m)  Wt 143 lb (64.864 kg)  BMI 21.11 kg/m2  SpO2 99% Physical Exam  Nursing note and vitals reviewed. Constitutional: He is oriented to person, place, and time. He appears well-developed and well-nourished. No distress.  HENT:  Head: Normocephalic and atraumatic.  Eyes: Conjunctivae and EOM are normal.  Neck: Normal range of motion.  Cardiovascular:  Normal rate and regular rhythm.  Exam reveals no gallop and no friction rub.   No murmur heard. Pulmonary/Chest: Effort normal and breath sounds normal. He has no wheezes. He has no rales. He exhibits no tenderness.  Abdominal: Soft. He exhibits no distension. There is no tenderness. There is no rebound.  Musculoskeletal: Normal range of motion.  Paraspinal lumbar tenderness to palpation. No midline spine tenderness.   Neurological: He is alert and oriented to person, place, and time. Coordination normal.  Lower extremity strength and sensation equal and intact bilaterally. Patient is able to ambulate without difficulty. Speech is goal-oriented. Moves limbs without ataxia.   Skin: Skin is warm and dry.  Psychiatric: He has a normal mood and affect. His behavior is normal.    ED Course  Procedures (including critical care time) Labs Review Labs Reviewed - No data to display  Imaging Review No results found.   EKG Interpretation None      MDM   Final diagnoses:  Chronic lower back pain    12:33 AM Patient has chronic back pain that is unchanged from previous visits. He has been unable to follow up with a PCP. No bladder/bowel incontinence or saddle paresthesias. I explained to the patient that I will not be prescribing narcotic pain medication. Patient requests 800mg  ibuprofen. Vitals stable and patient afebrile. No further evaluation needed at this time.     Emilia BeckKaitlyn Kastin Cerda, PA-C 07/25/14 0041

## 2014-07-27 NOTE — ED Provider Notes (Signed)
Medical screening examination/treatment/procedure(s) were performed by non-physician practitioner and as supervising physician I was immediately available for consultation/collaboration.   EKG Interpretation None       Olivia Mackie, MD 07/27/14 [phone]

## 2014-08-04 ENCOUNTER — Ambulatory Visit: Payer: Self-pay

## 2014-08-10 ENCOUNTER — Encounter (HOSPITAL_COMMUNITY): Payer: Self-pay | Admitting: Emergency Medicine

## 2014-08-10 DIAGNOSIS — R42 Dizziness and giddiness: Secondary | ICD-10-CM | POA: Insufficient documentation

## 2014-08-10 DIAGNOSIS — F17200 Nicotine dependence, unspecified, uncomplicated: Secondary | ICD-10-CM | POA: Insufficient documentation

## 2014-08-10 DIAGNOSIS — I10 Essential (primary) hypertension: Secondary | ICD-10-CM | POA: Insufficient documentation

## 2014-08-10 DIAGNOSIS — J45909 Unspecified asthma, uncomplicated: Secondary | ICD-10-CM | POA: Insufficient documentation

## 2014-08-10 DIAGNOSIS — R51 Headache: Secondary | ICD-10-CM | POA: Insufficient documentation

## 2014-08-10 LAB — CBC WITH DIFFERENTIAL/PLATELET
Basophils Absolute: 0 10*3/uL (ref 0.0–0.1)
Basophils Relative: 1 % (ref 0–1)
Eosinophils Absolute: 0.1 10*3/uL (ref 0.0–0.7)
Eosinophils Relative: 2 % (ref 0–5)
HCT: 40.7 % (ref 39.0–52.0)
Hemoglobin: 13.7 g/dL (ref 13.0–17.0)
LYMPHS ABS: 2.9 10*3/uL (ref 0.7–4.0)
LYMPHS PCT: 56 % — AB (ref 12–46)
MCH: 30.8 pg (ref 26.0–34.0)
MCHC: 33.7 g/dL (ref 30.0–36.0)
MCV: 91.5 fL (ref 78.0–100.0)
Monocytes Absolute: 0.3 10*3/uL (ref 0.1–1.0)
Monocytes Relative: 6 % (ref 3–12)
NEUTROS PCT: 35 % — AB (ref 43–77)
Neutro Abs: 1.8 10*3/uL (ref 1.7–7.7)
Platelets: 211 10*3/uL (ref 150–400)
RBC: 4.45 MIL/uL (ref 4.22–5.81)
RDW: 14.4 % (ref 11.5–15.5)
WBC: 5.1 10*3/uL (ref 4.0–10.5)

## 2014-08-10 LAB — COMPREHENSIVE METABOLIC PANEL
ALK PHOS: 46 U/L (ref 39–117)
ALT: 13 U/L (ref 0–53)
ANION GAP: 11 (ref 5–15)
AST: 24 U/L (ref 0–37)
Albumin: 3.6 g/dL (ref 3.5–5.2)
BILIRUBIN TOTAL: 0.2 mg/dL — AB (ref 0.3–1.2)
BUN: 15 mg/dL (ref 6–23)
CO2: 28 meq/L (ref 19–32)
Calcium: 8.9 mg/dL (ref 8.4–10.5)
Chloride: 103 mEq/L (ref 96–112)
Creatinine, Ser: 0.91 mg/dL (ref 0.50–1.35)
GFR calc Af Amer: >90 mL/min (ref >90)
GLUCOSE: 123 mg/dL — AB (ref 70–99)
POTASSIUM: 4 meq/L (ref 3.7–5.3)
SODIUM: 142 meq/L (ref 137–147)
TOTAL PROTEIN: 6.6 g/dL (ref 6.0–8.3)

## 2014-08-10 NOTE — ED Notes (Signed)
Pt. reports headache and mild dizziness onset today , denies fever or chills, pt. states history of hypertension with no meds.

## 2014-08-11 ENCOUNTER — Emergency Department (HOSPITAL_COMMUNITY)
Admission: EM | Admit: 2014-08-11 | Discharge: 2014-08-11 | Discharge status: Against Medical Advice | Payer: No Typology Code available for payment source | Attending: Emergency Medicine | Admitting: Emergency Medicine

## 2014-08-11 NOTE — ED Notes (Signed)
Pt was informed there were 2 people in front of him pt stated understanding then came back up to the desk and stated he was leaving. RN Tresa Endo out front when he turned in his pager

## 2014-08-12 ENCOUNTER — Encounter (HOSPITAL_COMMUNITY): Payer: Self-pay | Admitting: Emergency Medicine

## 2014-08-12 DIAGNOSIS — H81399 Other peripheral vertigo, unspecified ear: Secondary | ICD-10-CM | POA: Insufficient documentation

## 2014-08-12 DIAGNOSIS — F17200 Nicotine dependence, unspecified, uncomplicated: Secondary | ICD-10-CM | POA: Insufficient documentation

## 2014-08-12 DIAGNOSIS — Z791 Long term (current) use of non-steroidal anti-inflammatories (NSAID): Secondary | ICD-10-CM | POA: Insufficient documentation

## 2014-08-12 DIAGNOSIS — J45909 Unspecified asthma, uncomplicated: Secondary | ICD-10-CM | POA: Insufficient documentation

## 2014-08-12 DIAGNOSIS — M549 Dorsalgia, unspecified: Secondary | ICD-10-CM | POA: Insufficient documentation

## 2014-08-12 DIAGNOSIS — Z79899 Other long term (current) drug therapy: Secondary | ICD-10-CM | POA: Insufficient documentation

## 2014-08-12 DIAGNOSIS — Z8659 Personal history of other mental and behavioral disorders: Secondary | ICD-10-CM | POA: Insufficient documentation

## 2014-08-12 DIAGNOSIS — G8929 Other chronic pain: Secondary | ICD-10-CM | POA: Insufficient documentation

## 2014-08-12 DIAGNOSIS — R42 Dizziness and giddiness: Secondary | ICD-10-CM | POA: Insufficient documentation

## 2014-08-12 DIAGNOSIS — R110 Nausea: Secondary | ICD-10-CM | POA: Insufficient documentation

## 2014-08-12 DIAGNOSIS — I10 Essential (primary) hypertension: Secondary | ICD-10-CM | POA: Insufficient documentation

## 2014-08-12 LAB — CBC WITH DIFFERENTIAL/PLATELET
BASOS ABS: 0 10*3/uL (ref 0.0–0.1)
BASOS PCT: 1 % (ref 0–1)
Eosinophils Absolute: 0.1 10*3/uL (ref 0.0–0.7)
Eosinophils Relative: 2 % (ref 0–5)
HCT: 39.6 % (ref 39.0–52.0)
HEMOGLOBIN: 13.4 g/dL (ref 13.0–17.0)
LYMPHS PCT: 70 % — AB (ref 12–46)
Lymphs Abs: 3.2 10*3/uL (ref 0.7–4.0)
MCH: 30.7 pg (ref 26.0–34.0)
MCHC: 33.8 g/dL (ref 30.0–36.0)
MCV: 90.6 fL (ref 78.0–100.0)
Monocytes Absolute: 0.3 10*3/uL (ref 0.1–1.0)
Monocytes Relative: 6 % (ref 3–12)
Neutro Abs: 1 10*3/uL — ABNORMAL LOW (ref 1.7–7.7)
Neutrophils Relative %: 21 % — ABNORMAL LOW (ref 43–77)
PLATELETS: 190 10*3/uL (ref 150–400)
RBC: 4.37 MIL/uL (ref 4.22–5.81)
RDW: 14.3 % (ref 11.5–15.5)
WBC: 4.6 10*3/uL (ref 4.0–10.5)

## 2014-08-12 LAB — COMPREHENSIVE METABOLIC PANEL
ALBUMIN: 3.6 g/dL (ref 3.5–5.2)
ALK PHOS: 46 U/L (ref 39–117)
ALT: 12 U/L (ref 0–53)
AST: 23 U/L (ref 0–37)
Anion gap: 13 (ref 5–15)
BUN: 13 mg/dL (ref 6–23)
CALCIUM: 8.9 mg/dL (ref 8.4–10.5)
CO2: 22 mEq/L (ref 19–32)
Chloride: 103 mEq/L (ref 96–112)
Creatinine, Ser: 0.75 mg/dL (ref 0.50–1.35)
GFR calc Af Amer: >90 mL/min (ref >90)
GFR calc non Af Amer: >90 mL/min (ref >90)
GLUCOSE: 89 mg/dL (ref 70–99)
POTASSIUM: 4.3 meq/L (ref 3.7–5.3)
SODIUM: 138 meq/L (ref 137–147)
TOTAL PROTEIN: 6.7 g/dL (ref 6.0–8.3)
Total Bilirubin: 0.2 mg/dL — ABNORMAL LOW (ref 0.3–1.2)

## 2014-08-12 NOTE — ED Notes (Signed)
Pt. reports dizziness with mild nausea onset 2 days ago , alert and oriented /speech clear / no facial asymmetry.

## 2014-08-13 ENCOUNTER — Emergency Department (HOSPITAL_COMMUNITY)
Admission: EM | Admit: 2014-08-13 | Discharge: 2014-08-13 | Disposition: A | Discharge status: Home / Self Care | Payer: No Typology Code available for payment source | Attending: Emergency Medicine | Admitting: Emergency Medicine

## 2014-08-13 DIAGNOSIS — G8929 Other chronic pain: Secondary | ICD-10-CM

## 2014-08-13 DIAGNOSIS — H81399 Other peripheral vertigo, unspecified ear: Secondary | ICD-10-CM

## 2014-08-13 DIAGNOSIS — M549 Dorsalgia, unspecified: Secondary | ICD-10-CM

## 2014-08-13 MED ORDER — MECLIZINE HCL 25 MG PO TABS
25.0000 mg | ORAL_TABLET | Freq: Once | ORAL | Status: AC
  Administered 2014-08-13: 25 mg via ORAL
  Filled 2014-08-13: qty 1

## 2014-08-13 MED ORDER — CYCLOBENZAPRINE HCL 10 MG PO TABS: 10.0000 mg | ORAL_TABLET | Freq: Three times a day (TID) | ORAL | Status: DC | PRN

## 2014-08-13 MED ORDER — MECLIZINE HCL 25 MG PO TABS: 25.0000 mg | ORAL_TABLET | Freq: Three times a day (TID) | ORAL | Status: DC | PRN

## 2014-08-13 MED ORDER — IBUPROFEN 800 MG PO TABS: 800.0000 mg | ORAL_TABLET | Freq: Three times a day (TID) | ORAL | Status: DC

## 2014-08-13 NOTE — ED Provider Notes (Signed)
CSN: [id number]     Arrival date & time 08/12/14  2217 History   First MD Initiated Contact with Patient 08/13/14 0002     Chief Complaint  Patient presents with  . Dizziness     (Consider location/radiation/quality/duration/timing/severity/associated sxs/prior Treatment) Patient is a 58 y.o. male presenting with dizziness. The history is provided by the patient.  Dizziness He has been having intermittent dizziness for the last 2 days. Does this consists of being off-balance and a sense of things moving but not spinning and associated with nausea but no vomiting. He has a mild tendinitis initially but that has resolved. He had a headache initially, but that has also resolved. He denies any vision change. Is not active on. Symptoms tend to be worse when standing and better when sitting or lying. He asked he has no symptoms currently.  Past Medical History  Diagnosis Date  . Asthma   . Hypertension   . Bipolar 1 disorder    Past Surgical History  Procedure Laterality Date  . Orif right ankle Right 1985   No family history on file. History  Substance Use Topics  . Smoking status: Current Every Day Smoker -- 0.50 packs/day for 18 years    Types: Cigarettes  . Smokeless tobacco: Not on file  . Alcohol Use: Yes     Comment: 2 drinks weekly     Review of Systems  Neurological: Positive for dizziness.  All other systems reviewed and are negative.     Allergies  Methocarbamol  Home Medications   Prior to Admission medications   Medication Sig Start Date End Date Taking? Authorizing Provider  acetaminophen (TYLENOL) 500 MG tablet Take 1,000 mg by mouth every 6 (six) hours as needed for mild pain.     Historical Provider, MD  ARIPiprazole (ABILIFY) 15 MG tablet Take 7.5 mg by mouth daily.    Historical Provider, MD  Cholecalciferol (VITAMIN D PO) Take 1 tablet by mouth daily.    Historical Provider, MD  Cyanocobalamin (VITAMIN B-12 PO) Take 1 tablet by mouth daily.     Historical Provider, MD  ibuprofen (ADVIL,MOTRIN) 800 MG tablet Take 1 tablet (800 mg total) by mouth 3 (three) times daily. 07/25/14   Kaitlyn Szekalski, PA-C  loratadine (CLARITIN) 10 MG tablet Take 10 mg by mouth daily as needed for allergies.  09/09/13   [name], MD  naproxen (NAPROSYN) 375 MG tablet Take 1 tablet (375 mg total) by mouth 2 (two) times daily. 07/09/14   Tiffany Irine Seal, PA-C  ondansetron (ZOFRAN) 4 MG tablet Take 1 tablet (4 mg total) by mouth every 6 (six) hours. 07/09/14   Tiffany Irine Seal, PA-C  traMADol (ULTRAM) 50 MG tablet Take 1 tablet (50 mg total) by mouth every 6 (six) hours as needed. 07/06/14   [name], PA-C   BP 150/76  Pulse 55  Temp(Src) 99 F (37.2 C) (Oral)  Resp 18  Ht  (1.753 m)  Wt 145 lb (65.772 kg)  BMI 21.40 kg/m2  SpO2 99% Physical Exam  Nursing note and vitals reviewed.  58 year old male, resting comfortably and in no acute distress. Vital signs are significant for bradycardia and hypertension. Oxygen saturation is 99%, which is normal. Head is normocephalic and atraumatic. PERRLA, EOMI. Oropharynx is clear. There is no nystagmus. Neck is nontender and supple without adenopathy or JVD. There are no carotid bruits. Back is nontender and there is no CVA tenderness. Lungs are clear without rales, wheezes, or rhonchi. Chest  is nontender. Heart has regular rate and rhythm without murmur. Abdomen is soft, flat, nontender without masses or hepatosplenomegaly and peristalsis is normoactive. Extremities have no cyanosis or edema, full range of motion is present. Skin is warm and dry without rash. Neurologic: Mental status is normal, cranial nerves are intact, there are no motor or sensory deficits. Dizziness is not reproduced by head movement.  ED Course  Procedures (including critical care time) Labs Review Results for orders placed during the hospital encounter of 08/13/14  CBC WITH DIFFERENTIAL      Result Value Ref Range   WBC 4.6   4.0 - 10.5 K/uL   RBC 4.37  4.22 - 5.81 MIL/uL   Hemoglobin 13.4  13.0 - 17.0 g/dL   HCT 56.2  13.0 - 86.5 %   MCV 90.6  78.0 - 100.0 fL   MCH 30.7  26.0 - 34.0 pg   MCHC 33.8  30.0 - 36.0 g/dL   RDW 78.4  69.6 - 29.5 %   Platelets 190  150 - 400 K/uL   Neutrophils Relative % 21 (*) 43 - 77 %   Lymphocytes Relative 70 (*) 12 - 46 %   Monocytes Relative 6  3 - 12 %   Eosinophils Relative 2  0 - 5 %   Basophils Relative 1  0 - 1 %   Neutro Abs 1.0 (*) 1.7 - 7.7 K/uL   Lymphs Abs 3.2  0.7 - 4.0 K/uL   Monocytes Absolute 0.3  0.1 - 1.0 K/uL   Eosinophils Absolute 0.1  0.0 - 0.7 K/uL   Basophils Absolute 0.0  0.0 - 0.1 K/uL   RBC Morphology POLYCHROMASIA PRESENT    COMPREHENSIVE METABOLIC PANEL      Result Value Ref Range   Sodium 138  137 - 147 mEq/L   Potassium 4.3  3.7 - 5.3 mEq/L   Chloride 103  96 - 112 mEq/L   CO2 22  19 - 32 mEq/L   Glucose, Bld 89  70 - 99 mg/dL   BUN 13  6 - 23 mg/dL   Creatinine, Ser 2.84  0.50 - 1.35 mg/dL   Calcium 8.9  8.4 - 13.2 mg/dL   Total Protein 6.7  6.0 - 8.3 g/dL   Albumin 3.6  3.5 - 5.2 g/dL   AST 23  0 - 37 U/L   ALT 12  0 - 53 U/L   Alkaline Phosphatase 46  39 - 117 U/L   Total Bilirubin 0.2 (*) 0.3 - 1.2 mg/dL   GFR calc non Af Amer >90  >90 mL/min   GFR calc Af Amer >90  >90 mL/min   Anion gap 13  5 - 15    EKG Interpretation   Date/Time:  Wednesday August 12 2014 22:27:01 EDT Ventricular Rate:  52 PR Interval:  162 QRS Duration: 88 QT Interval:  420 QTC Calculation: 390 R Axis:   90 Text Interpretation:  Sinus bradycardia Rightward axis Borderline ECG No  old tracing to compare Confirmed by [facility]  MD, Kodah ([id number]) on 08/13/2014  1:03:20 AM      MDM   Final diagnoses:  Peripheral vertigo, unspecified laterality  Chronic back pain    Dizziness which seems most compatible with peripheral vertigo although it is not classic for same. Her lipids are reviewed and he has multiple visits for back pain. He states it he is  about to run out of his back pain medication and would like it to be refilled.  I reviewed his record on the [place] controlled substance reporting website and there are 2 prescriptions for hydrocodone-acetaminophen in the last 6 months head in both the month of July for a total of 32 tablets. Laboratory workup is unremarkable and his ECG is normal. He'll be given a therapeutic trial of meclizine. With acetic vital signs be checked.  Orthostatic vital signs showed no significant change in pulse or blood pressure. He is discharged with prescription for meclizine and is also given prescription for ibuprofen and cyclobenzaprine. He states that he cannot take naproxen because it gives her stomach upset that he can take ibuprofen.  [name], MD 08/13/14 Lyda Jester

## 2014-08-13 NOTE — ED Notes (Signed)
Dr. Glick at bedside.  

## 2014-08-13 NOTE — Discharge Instructions (Signed)
Vertigo Vertigo means you feel like you or your surroundings are moving when they are not. Vertigo can be dangerous if it occurs when you are at work, driving, or performing difficult activities.  CAUSES  Vertigo occurs when there is a conflict of signals sent to your brain from the visual and sensory systems in your body. There are many different causes of vertigo, including:  Infections, especially in the inner ear.  A bad reaction to a drug or misuse of alcohol and medicines.  Withdrawal from drugs or alcohol.  Rapidly changing positions, such as lying down or rolling over in bed.  A migraine headache.  Decreased blood flow to the brain.  Increased pressure in the brain from a head injury, infection, tumor, or bleeding. SYMPTOMS  You may feel as though the world is spinning around or you are falling to the ground. Because your balance is upset, vertigo can cause nausea and vomiting. You may have involuntary eye movements (nystagmus). DIAGNOSIS  Vertigo is usually diagnosed by physical exam. If the cause of your vertigo is unknown, your caregiver may perform imaging tests, such as an MRI scan (magnetic resonance imaging). TREATMENT  Most cases of vertigo resolve on their own, without treatment. Depending on the cause, your caregiver may prescribe certain medicines. If your vertigo is related to body position issues, your caregiver may recommend movements or procedures to correct the problem. In rare cases, if your vertigo is caused by certain inner ear problems, you may need surgery. HOME CARE INSTRUCTIONS   Follow your caregiver's instructions.  Avoid driving.  Avoid operating heavy machinery.  Avoid performing any tasks that would be dangerous to you or others during a vertigo episode.  Tell your caregiver if you notice that certain medicines seem to be causing your vertigo. Some of the medicines used to treat vertigo episodes can actually make them worse in some people. SEEK  IMMEDIATE MEDICAL CARE IF:   Your medicines do not relieve your vertigo or are making it worse.  You develop problems with talking, walking, weakness, or using your arms, hands, or legs.  You develop severe headaches.  Your nausea or vomiting continues or gets worse.  You develop visual changes.  A family member notices behavioral changes.  Your condition gets worse. MAKE SURE YOU:  Understand these instructions.  Will watch your condition.  Will get help right away if you are not doing well or get worse. Document Released: 08/30/2005 Document Revised: 02/12/2012 Document Reviewed: 06/08/2011 [facility] Patient Information 2015 [place], [place]. This information is not intended to replace advice given to you by your health care provider. Make sure you discuss any questions you have with your health care provider.  Back Pain, Adult Low back pain is very common. About 1 in 5 people have back pain.The cause of low back pain is rarely dangerous. The pain often gets better over time.About half of people with a sudden onset of back pain feel better in just 2 weeks. About 8 in 10 people feel better by 6 weeks.  CAUSES Some common causes of back pain include:  Strain of the muscles or ligaments supporting the spine.  Wear and tear (degeneration) of the spinal discs.  Arthritis.  Direct injury to the back. DIAGNOSIS Most of the time, the direct cause of low back pain is not known.However, back pain can be treated effectively even when the exact cause of the pain is unknown.Answering your caregiver's questions about your overall health and symptoms is one of the  most accurate ways to make sure the cause of your pain is not dangerous. If your caregiver needs more information, he or she may order lab work or imaging tests (X-rays or MRIs).However, even if imaging tests show changes in your back, this usually does not require surgery. HOME CARE INSTRUCTIONS For many people, back pain  returns.Since low back pain is rarely dangerous, it is often a condition that people can learn to [facility] their own.   Remain active. It is stressful on the back to sit or stand in one place. Do not sit, drive, or stand in one place for more than 30 minutes at a time. Take short walks on level surfaces as soon as pain allows.Try to increase the length of time you walk each day.  Do not stay in bed.Resting more than 1 or 2 days can delay your recovery.  Do not avoid exercise or work.Your body is made to move.It is not dangerous to be active, even though your back may hurt.Your back will likely heal faster if you return to being active before your pain is gone.  Pay attention to your body when you bend and lift. Many people have less discomfortwhen lifting if they bend their knees, keep the load close to their bodies,and avoid twisting. Often, the most comfortable positions are those that put less stress on your recovering back.  Find a comfortable position to sleep. Use a firm mattress and lie on your side with your knees slightly bent. If you lie on your back, put a pillow under your knees.  Only take over-the-counter or prescription medicines as directed by your caregiver. Over-the-counter medicines to reduce pain and inflammation are often the most helpful.Your caregiver may prescribe muscle relaxant drugs.These medicines help dull your pain so you can more quickly return to your normal activities and healthy exercise.  Put ice on the injured area.  Put ice in a plastic bag.  Place a towel between your skin and the bag.  Leave the ice on for 15-20 minutes, 03-04 times a day for the first 2 to 3 days. After that, ice and heat may be alternated to reduce pain and spasms.  Ask your caregiver about trying back exercises and gentle massage. This may be of some benefit.  Avoid feeling anxious or stressed.Stress increases muscle tension and can worsen back pain.It is important to  recognize when you are anxious or stressed and learn ways to manage it.Exercise is a great option. SEEK MEDICAL CARE IF:  You have pain that is not relieved with rest or medicine.  You have pain that does not improve in 1 week.  You have new symptoms.  You are generally not feeling well. SEEK IMMEDIATE MEDICAL CARE IF:   You have pain that radiates from your back into your legs.  You develop new bowel or bladder control problems.  You have unusual weakness or numbness in your arms or legs.  You develop nausea or vomiting.  You develop abdominal pain.  You feel faint. Document Released: 11/20/2005 Document Revised: 05/21/2012 Document Reviewed: 03/24/2014 [facility] Patient Information 2015 [place], [place]. This information is not intended to replace advice given to you by your health care provider. Make sure you discuss any questions you have with your health care provider.  Meclizine tablets or capsules What is this medicine? MECLIZINE (MEK li zeen) is an antihistamine. It is used to prevent nausea, vomiting, or dizziness caused by motion sickness. It is also used to prevent and treat vertigo (extreme dizziness  or a feeling that you or your surroundings are tilting or spinning around). This medicine may be used for other purposes; ask your health care provider or pharmacist if you have questions. COMMON BRAND NAME(S): Antivert, Dramamine Less Drowsy, Medivert, Meni-D What should I tell my health care provider before I take this medicine? They need to know if you have any of these conditions: -asthma -glaucoma -prostate trouble -stomach problems -urinary problems -an unusual or allergic reaction to meclizine, other medicines, foods, dyes, or preservatives -pregnant or trying to get pregnant -breast-feeding How should I use this medicine? Take this medicine by mouth with a glass of water. Follow the directions on the prescription label. If you are using this medicine to  prevent motion sickness, take the dose at least 1 hour before travel. If it upsets your stomach, take it with food or milk. Take your doses at regular intervals. Do not take your medicine more often than directed. Talk to your pediatrician regarding the use of this medicine in children. Special care may be needed. Overdosage: If you think you have taken too much of this medicine contact a poison control center or emergency room at once. NOTE: This medicine is only for you. Do not share this medicine with others. What if I miss a dose? If you miss a dose, take it as soon as you can. If it is almost time for your next dose, take only that dose. Do not take double or extra doses. What may interact with this medicine? -barbiturate medicines for inducing sleep or treating seizures -digoxin -medicines for anxiety or sleeping problems, like alprazolam, diazepam or temazepam -medicines for hay fever and other allergies -medicines for mental depression -medicines for movement abnormalities as in Parkinson's disease, or for stomach problems -medicines for pain -medicines that relax muscles This list may not describe all possible interactions. Give your health care provider a list of all the medicines, herbs, non-prescription drugs, or dietary supplements you use. Also tell them if you smoke, drink alcohol, or use illegal drugs. Some items may interact with your medicine. What should I watch for while using this medicine? If you are taking this medicine on a regular schedule, visit your doctor or health care professional for regular checks on your progress. You may get dizzy, drowsy or have blurred vision. Do not drive, use machinery, or do anything that needs mental alertness until you know how this medicine affects you. Do not stand or sit up quickly, especially if you are an older patient. This reduces the risk of dizzy or fainting spells. Alcohol can increase possible dizziness. Avoid alcoholic  drinks. Your mouth may get dry. Chewing sugarless gum or sucking hard candy, and drinking plenty of water may help. Contact your doctor if the problem does not go away or is severe. This medicine may cause dry eyes and blurred vision. If you wear contact lenses you may feel some discomfort. Lubricating drops may help. See your eye doctor if the problem does not go away or is severe. What side effects may I notice from receiving this medicine? Side effects that you should report to your doctor or health care professional as soon as possible: -fainting spells -fast or irregular heartbeat Side effects that usually do not require medical attention (report to your doctor or health care professional if they continue or are bothersome): -constipation -difficulty passing urine -difficulty sleeping -headache -stomach upset This list may not describe all possible side effects. Call your doctor for medical advice about side effects.  You may report side effects to FDA at 1-800-FDA-1088. Where should I keep my medicine? Keep out of the reach of children. Store at room temperature between 15 and 30 degrees C (59 and 86 degrees F). Keep container tightly closed. Throw away any unused medicine after the expiration date. NOTE: This sheet is a summary. It may not cover all possible information. If you have questions about this medicine, talk to your doctor, pharmacist, or health care provider.  2015, Elsevier/Gold Standard. (2008-05-28 10:35:36)  Cyclobenzaprine tablets What is this medicine? CYCLOBENZAPRINE (sye kloe BEN za preen) is a muscle relaxer. It is used to treat muscle pain, spasms, and stiffness. This medicine may be used for other purposes; ask your health care provider or pharmacist if you have questions. COMMON BRAND NAME(S): Fexmid, Flexeril What should I tell my health care provider before I take this medicine? They need to know if you have any of these conditions: -heart disease, irregular  heartbeat, or previous heart attack -liver disease -thyroid problem -an unusual or allergic reaction to cyclobenzaprine, tricyclic antidepressants, lactose, other medicines, foods, dyes, or preservatives -pregnant or trying to get pregnant -breast-feeding How should I use this medicine? Take this medicine by mouth with a glass of water. Follow the directions on the prescription label. If this medicine upsets your stomach, take it with food or milk. Take your medicine at regular intervals. Do not take it more often than directed. Talk to your pediatrician regarding the use of this medicine in children. Special care may be needed. Overdosage: If you think you have taken too much of this medicine contact a poison control center or emergency room at once. NOTE: This medicine is only for you. Do not share this medicine with others. What if I miss a dose? If you miss a dose, take it as soon as you can. If it is almost time for your next dose, take only that dose. Do not take double or extra doses. What may interact with this medicine? Do not take this medicine with any of the following medications: -certain medicines for fungal infections like fluconazole, itraconazole, ketoconazole, posaconazole, voriconazole -cisapride -dofetilide -dronedarone -droperidol -flecainide -grepafloxacin -halofantrine -levomethadyl -MAOIs like Carbex, Eldepryl, Marplan, Nardil, and Parnate -nilotinib -pimozide -probucol -sertindole -thioridazine -ziprasidone This medicine may also interact with the following medications: -abarelix -alcohol -certain medicines for cancer -certain medicines for depression, anxiety, or psychotic disturbances -certain medicines for infection like alfuzosin, chloroquine, clarithromycin, levofloxacin, mefloquine, pentamidine, troleandomycin -certain medicines for an irregular heart beat -certain medicines used for sleep or numbness during surgery or procedure -contrast  dyes -dolasetron -guanethidine -methadone -octreotide -ondansetron -other medicines that prolong the QT interval (cause an abnormal heart rhythm) -palonosetron -phenothiazines like chlorpromazine, mesoridazine, prochlorperazine, thioridazine -tramadol -vardenafil This list may not describe all possible interactions. Give your health care provider a list of all the medicines, herbs, non-prescription drugs, or dietary supplements you use. Also tell them if you smoke, drink alcohol, or use illegal drugs. Some items may interact with your medicine. What should I watch for while using this medicine? Check with your doctor or health care professional if your condition does not improve within 1 to 3 weeks. You may get drowsy or dizzy when you first start taking the medicine or change doses. Do not drive, use machinery, or do anything that may be dangerous until you know how the medicine affects you. Stand or sit up slowly. Your mouth may get dry. Drinking water, chewing sugarless gum, or sucking on hard candy may help.  What side effects may I notice from receiving this medicine? Side effects that you should report to your doctor or health care professional as soon as possible: -allergic reactions like skin rash, itching or hives, swelling of the face, lips, or tongue -chest pain -fast heartbeat -hallucinations -seizures -vomiting Side effects that usually do not require medical attention (report to your doctor or health care professional if they continue or are bothersome): -headache This list may not describe all possible side effects. Call your doctor for medical advice about side effects. You may report side effects to FDA at 1-800-FDA-1088. Where should I keep my medicine? Keep out of the reach of children. Store at room temperature between 15 and 30 degrees C (59 and 86 degrees F). Keep container tightly closed. Throw away any unused medicine after the expiration date. NOTE: This sheet is  a summary. It may not cover all possible information. If you have questions about this medicine, talk to your doctor, pharmacist, or health care provider.  2015, Elsevier/Gold Standard. (2013-06-17 12:48:19)  Ibuprofen tablets and capsules What is this medicine? IBUPROFEN (eye BYOO proe fen) is a non-steroidal anti-inflammatory drug (NSAID). It is used for dental pain, fever, headaches or migraines, osteoarthritis, rheumatoid arthritis, or painful monthly periods. It can also relieve minor aches and pains caused by a cold, flu, or sore throat. This medicine may be used for other purposes; ask your health care provider or pharmacist if you have questions. COMMON BRAND NAME(S): Advil, Advil Junior Strength, Advil Migraine, Genpril, Ibren, IBU, Midol, Midol Cramps and Body Aches, Motrin, Motrin IB, Motrin Junior Strength, Motrin Migraine Pain, Samson-8, Toxicology Saliva Collection What should I tell my health care provider before I take this medicine? They need to know if you have any of these conditions: -asthma -cigarette smoker -drink more than 3 alcohol containing drinks a day -heart disease or circulation problems such as heart failure or leg edema (fluid retention) -high blood pressure -kidney disease -liver disease -stomach bleeding or ulcers -an unusual or allergic reaction to ibuprofen, aspirin, other NSAIDS, other medicines, foods, dyes, or preservatives -pregnant or trying to get pregnant -breast-feeding How should I use this medicine? Take this medicine by mouth with a glass of water. Follow the directions on the prescription label. Take this medicine with food if your stomach gets upset. Try to not lie down for at least 10 minutes after you take the medicine. Take your medicine at regular intervals. Do not take your medicine more often than directed. A special MedGuide will be given to you by the pharmacist with each prescription and refill. Be sure to read this information  carefully each time. Talk to your pediatrician regarding the use of this medicine in children. Special care may be needed. Overdosage: If you think you have taken too much of this medicine contact a poison control center or emergency room at once. NOTE: This medicine is only for you. Do not share this medicine with others. What if I miss a dose? If you miss a dose, take it as soon as you can. If it is almost time for your next dose, take only that dose. Do not take double or extra doses. What may interact with this medicine? Do not take this medicine with any of the following medications: -cidofovir -ketorolac -methotrexate -pemetrexed This medicine may also interact with the following medications: -alcohol -aspirin -diuretics -lithium -other drugs for inflammation like prednisone -warfarin This list may not describe all possible interactions. Give your health care provider a list  of all the medicines, herbs, non-prescription drugs, or dietary supplements you use. Also tell them if you smoke, drink alcohol, or use illegal drugs. Some items may interact with your medicine. What should I watch for while using this medicine? Tell your doctor or healthcare professional if your symptoms do not start to get better or if they get worse. This medicine does not prevent heart attack or stroke. In fact, this medicine may increase the chance of a heart attack or stroke. The chance may increase with longer use of this medicine and in people who have heart disease. If you take aspirin to prevent heart attack or stroke, talk with your doctor or health care professional. Do not take other medicines that contain aspirin, ibuprofen, or naproxen with this medicine. Side effects such as stomach upset, nausea, or ulcers may be more likely to occur. Many medicines available without a prescription should not be taken with this medicine. This medicine can cause ulcers and bleeding in the stomach and intestines at  any time during treatment. Ulcers and bleeding can happen without warning symptoms and can cause death. To reduce your risk, do not smoke cigarettes or drink alcohol while you are taking this medicine. You may get drowsy or dizzy. Do not drive, use machinery, or do anything that needs mental alertness until you know how this medicine affects you. Do not stand or sit up quickly, especially if you are an older patient. This reduces the risk of dizzy or fainting spells. This medicine can cause you to bleed more easily. Try to avoid damage to your teeth and gums when you brush or floss your teeth. This medicine may be used to treat migraines. If you take migraine medicines for 10 or more days a month, your migraines may get worse. Keep a diary of headache days and medicine use. Contact your healthcare professional if your migraine attacks occur more frequently. What side effects may I notice from receiving this medicine? Side effects that you should report to your doctor or health care professional as soon as possible: -allergic reactions like skin rash, itching or hives, swelling of the face, lips, or tongue -severe stomach pain -signs and symptoms of bleeding such as bloody or black, tarry stools; red or dark-brown urine; spitting up blood or brown material that looks like coffee grounds; red spots on the skin; unusual bruising or bleeding from the eye, gums, or nose -signs and symptoms of a blood clot such as changes in vision; chest pain; severe, sudden headache; trouble speaking; sudden numbness or weakness of the face, arm, or leg -unexplained weight gain or swelling -unusually weak or tired -yellowing of eyes or skin Side effects that usually do not require medical attention (report to your doctor or health care professional if they continue or are bothersome): -bruising -diarrhea -dizziness, drowsiness -headache -nausea, vomiting This list may not describe all possible side effects. Call your  doctor for medical advice about side effects. You may report side effects to FDA at 1-800-FDA-1088. Where should I keep my medicine? Keep out of the reach of children. Store at room temperature between 15 and 30 degrees C (59 and 86 degrees F). Keep container tightly closed. Throw away any unused medicine after the expiration date. NOTE: This sheet is a summary. It may not cover all possible information. If you have questions about this medicine, talk to your doctor, pharmacist, or health care provider.  2015, Elsevier/Gold Standard. (2013-07-22 10:48:02)

## 2014-08-14 LAB — PATHOLOGIST SMEAR REVIEW

## 2014-09-09 ENCOUNTER — Ambulatory Visit: Payer: Self-pay

## 2014-11-20 ENCOUNTER — Emergency Department (HOSPITAL_COMMUNITY)
Admission: EM | Admit: 2014-11-20 | Discharge: 2014-11-20 | Disposition: A | Discharge status: Home / Self Care | Payer: No Typology Code available for payment source | Attending: Emergency Medicine | Admitting: Emergency Medicine

## 2014-11-20 ENCOUNTER — Encounter (HOSPITAL_COMMUNITY): Payer: Self-pay | Admitting: *Deleted

## 2014-11-20 DIAGNOSIS — J45909 Unspecified asthma, uncomplicated: Secondary | ICD-10-CM | POA: Insufficient documentation

## 2014-11-20 DIAGNOSIS — M545 Low back pain, unspecified: Secondary | ICD-10-CM

## 2014-11-20 DIAGNOSIS — Z720 Tobacco use: Secondary | ICD-10-CM | POA: Insufficient documentation

## 2014-11-20 DIAGNOSIS — X58XXXA Exposure to other specified factors, initial encounter: Secondary | ICD-10-CM | POA: Insufficient documentation

## 2014-11-20 DIAGNOSIS — Z8659 Personal history of other mental and behavioral disorders: Secondary | ICD-10-CM | POA: Insufficient documentation

## 2014-11-20 DIAGNOSIS — S3992XA Unspecified injury of lower back, initial encounter: Secondary | ICD-10-CM | POA: Insufficient documentation

## 2014-11-20 DIAGNOSIS — G8929 Other chronic pain: Secondary | ICD-10-CM

## 2014-11-20 DIAGNOSIS — M549 Dorsalgia, unspecified: Secondary | ICD-10-CM

## 2014-11-20 DIAGNOSIS — Z79899 Other long term (current) drug therapy: Secondary | ICD-10-CM | POA: Insufficient documentation

## 2014-11-20 DIAGNOSIS — Y9389 Activity, other specified: Secondary | ICD-10-CM | POA: Insufficient documentation

## 2014-11-20 DIAGNOSIS — S8991XA Unspecified injury of right lower leg, initial encounter: Secondary | ICD-10-CM | POA: Insufficient documentation

## 2014-11-20 DIAGNOSIS — Y998 Other external cause status: Secondary | ICD-10-CM | POA: Insufficient documentation

## 2014-11-20 DIAGNOSIS — I10 Essential (primary) hypertension: Secondary | ICD-10-CM | POA: Insufficient documentation

## 2014-11-20 DIAGNOSIS — Y9289 Other specified places as the place of occurrence of the external cause: Secondary | ICD-10-CM | POA: Insufficient documentation

## 2014-11-20 MED ORDER — HYDROCODONE-ACETAMINOPHEN 5-325 MG PO TABS
1.0000 | ORAL_TABLET | Freq: Once | ORAL | Status: AC
  Administered 2014-11-20: 1 via ORAL
  Filled 2014-11-20: qty 1

## 2014-11-20 MED ORDER — IBUPROFEN 800 MG PO TABS: 800.0000 mg | ORAL_TABLET | Freq: Three times a day (TID) | ORAL | Status: DC

## 2014-11-20 MED ORDER — CYCLOBENZAPRINE HCL 10 MG PO TABS: 10.0000 mg | ORAL_TABLET | Freq: Three times a day (TID) | ORAL | Status: DC | PRN

## 2014-11-20 NOTE — ED Provider Notes (Signed)
CSN: [id number]     Arrival date & time 11/20/14  1647 History  This chart was scribed for non-physician practitioner, Dierdre ForthHannah Mendell Bontempo, PA-C, working with Suzi RootsKevin E Steinl, MD, by Bronson CurbJacqueline Melvin, ED Scribe. This patient was seen in room TR11C/TR11C and the patient's care was started at 5:13 PM.     Chief Complaint  Patient presents with  . Back Pain    The history is provided by the patient and medical records. No language interpreter was used.     HPI Comments: Ricardo Brooks is a 58 y.o. male who presents to the Emergency Department complaining of an exacerbation of his chronic lower back pain for the past 2 days. Patient states he experienced a "lumbar spasm" which caused pain in his right leg ultimately causing him to fall 2 days ago. He states the pain radiates to his right leg when he experiences these spasms, but is not present at other times or at this time. Patient has taken 800mg  of ibuprofen 2 days ago without significant improvement.  He is currently out of this medication. Patient has also tried muscle relaxers in the past for his back pain with relief, but none this time. He denies bowel/bladder incontinence, saddle anesthesia, or weakness. Patient is not established with a PCP or followed up as directed in the past.   Past Medical History  Diagnosis Date  . Asthma   . Hypertension   . Bipolar 1 disorder    Past Surgical History  Procedure Laterality Date  . Orif right ankle Right 1985   No family history on file. History  Substance Use Topics  . Smoking status: Current Every Day Smoker -- 0.50 packs/day for 18 years    Types: Cigarettes  . Smokeless tobacco: Not on file  . Alcohol Use: Yes     Comment: 2 drinks weekly     Review of Systems  Constitutional: Negative for fever, chills and fatigue.  Respiratory: Negative for chest tightness and shortness of breath.   Cardiovascular: Negative for chest pain.  Gastrointestinal: Negative for nausea, vomiting,  abdominal pain and diarrhea.  Genitourinary: Negative for dysuria, urgency, frequency and hematuria.  Musculoskeletal: Positive for myalgias (right leg) and back pain. Negative for joint swelling, gait problem, neck pain and neck stiffness.  Skin: Negative for rash.  Neurological: Negative for weakness, light-headedness, numbness and headaches.  All other systems reviewed and are negative.     Allergies  Methocarbamol  Home Medications   Prior to Admission medications   Medication Sig Start Date End Date Taking? Authorizing Provider  acetaminophen (TYLENOL) 500 MG tablet Take 1,000 mg by mouth every 6 (six) hours as needed for mild pain.     Historical Provider, MD  ARIPiprazole (ABILIFY) 15 MG tablet Take 7.5 mg by mouth daily.    Historical Provider, MD  Cholecalciferol (VITAMIN D PO) Take 1 tablet by mouth daily.    Historical Provider, MD  Cyanocobalamin (VITAMIN B-12 PO) Take 1 tablet by mouth daily.    Historical Provider, MD  cyclobenzaprine (FLEXERIL) 10 MG tablet Take 1 tablet (10 mg total) by mouth 3 (three) times daily as needed for muscle spasms. 11/20/14   Kajal Scalici, PA-C  ibuprofen (ADVIL,MOTRIN) 800 MG tablet Take 1 tablet (800 mg total) by mouth 3 (three) times daily. 11/20/14   Lorenzo Pereyra, PA-C  loratadine (CLARITIN) 10 MG tablet Take 10 mg by mouth daily as needed for allergies.  09/09/13   Linwood DibblesJon Knapp, MD  meclizine (ANTIVERT) 25 MG tablet Take  1 tablet (25 mg total) by mouth 3 (three) times daily as needed for dizziness. 08/13/14   [name], MD  ondansetron (ZOFRAN) 4 MG tablet Take 1 tablet (4 mg total) by mouth every 6 (six) hours. 07/09/14   Tiffany Irine Seal, PA-C  traMADol (ULTRAM) 50 MG tablet Take 1 tablet (50 mg total) by mouth every 6 (six) hours as needed. 07/06/14   [name], PA-C   Triage Vitals: BP 137/83 mmHg  Pulse 60  Temp(Src) 98.1 F (36.7 C) (Oral)  SpO2 98%  Physical Exam  Constitutional: He appears well-developed and  well-nourished. No distress.  HENT:  Head: Normocephalic and atraumatic.  Mouth/Throat: Oropharynx is clear and moist. No oropharyngeal exudate.  Eyes: Conjunctivae are normal.  Neck: Normal range of motion. Neck supple.  Full ROM without pain  Cardiovascular: Normal rate, regular rhythm, normal heart sounds and intact distal pulses.   No murmur heard. Pulmonary/Chest: Effort normal and breath sounds normal. No respiratory distress. He has no wheezes.  Abdominal: Soft. He exhibits no distension. There is no tenderness.  Musculoskeletal: Normal range of motion. He exhibits tenderness (right paraspinal).  Full range of motion of the T-spine and L-spine with pain No tenderness to palpation of the spinous processes of the T-spine or L-spine Mild tenderness to palpation of the paraspinous muscles of the L-spine; R > L  Lymphadenopathy:    He has no cervical adenopathy.  Neurological: He is alert. He has normal reflexes. Coordination normal.  Reflex Scores:      Bicep reflexes are 2+ on the right side and 2+ on the left side.      Brachioradialis reflexes are 2+ on the right side and 2+ on the left side.      Patellar reflexes are 2+ on the right side and 2+ on the left side.      Achilles reflexes are 2+ on the right side and 2+ on the left side. Speech is clear and goal oriented, follows commands Normal 5/5 strength in upper and lower extremities bilaterally including dorsiflexion and plantar flexion, strong and equal grip strength Sensation normal to light and sharp touch Moves extremities without ataxia, coordination intact Normal gait Normal balance No Clonus   Skin: Skin is warm and dry. No rash noted. He is not diaphoretic. No erythema.  Psychiatric: He has a normal mood and affect. His behavior is normal.  Nursing note and vitals reviewed.   ED Course  Procedures (including critical care time)  DIAGNOSTIC STUDIES: Oxygen Saturation is 98% on room air, normal by my  interpretation.    COORDINATION OF CARE: At 1716 Discussed treatment plan with patient which includes pain medication, muscle relaxer, and establishment with PCP. Patient agrees.   Labs Review Labs Reviewed - No data to display  Imaging Review No results found.   EKG Interpretation None      MDM   Final diagnoses:  Right-sided low back pain without sciatica  Chronic back pain greater than 3 months duration   Ricardo Brooks presents with acute exacerbation of chronic back pain. Patient with back pain.  No neurological deficits and normal neuro exam.  Patient can walk but states is painful.  No loss of bowel or bladder control.  No concern for cauda equina.  No fever, night sweats, weight loss, h/o cancer, IVDU.  RICE protocol, anti-inflammatories and muscle relaxers indicated and discussed with patient.  Will give Vicodin for pain here in the emergency department.  I have personally reviewed patient's vitals, nursing  note and any pertinent labs or imaging.  I performed an focused physical exam; undressed when appropriate .    It has been determined that no acute conditions requiring further emergency intervention are present at this time. The patient/guardian have been advised of the diagnosis and plan. I reviewed any labs and imaging including any potential incidental findings. We have discussed signs and symptoms that warrant return to the ED and they are listed in the discharge instructions.    Vital signs are stable at discharge.   BP 137/83 mmHg  Pulse 60  Temp(Src) 98.1 F (36.7 C) (Oral)  SpO2 98%  I personally performed the services described in this documentation, which was scribed in my presence. The recorded information has been reviewed and is accurate.    Dahlia ClientHannah Cayson Kalb, PA-C 11/20/14 1735  Suzi RootsKevin E Steinl, MD 11/23/14 0800

## 2014-11-20 NOTE — ED Notes (Signed)
Pt st's he has had chronic back pain for years but has gotton worse over past couple of days.  Pt is currently wearing a back brace which he said he borrowed from someone

## 2014-11-20 NOTE — Discharge Instructions (Signed)
1. Medications: robaxin, naproxyn, vicodin, usual home medications °2. Treatment: rest, drink plenty of fluids, gentle stretching as discussed, alternate ice and heat °3. Follow Up: Please followup with your primary doctor in 3 days for discussion of your diagnoses and further evaluation after today's visit; if you do not have a primary care doctor use the resource guide provided to find one;  Return to the ER for worsening back pain, difficulty walking, loss of bowel or bladder control or other concerning symptoms ° ° ° ° °Back Exercises °Back exercises help treat and prevent back injuries. The goal of back exercises is to increase the strength of your abdominal and back muscles and the flexibility of your back. These exercises should be started when you no longer have back pain. Back exercises include: °· Pelvic Tilt. Lie on your back with your knees bent. Tilt your pelvis until the lower part of your back is against the floor. Hold this position 5 to 10 sec and repeat 5 to 10 times. °· Knee to Chest. Pull first 1 knee up against your chest and hold for 20 to 30 seconds, repeat this with the other knee, and then both knees. This may be done with the other leg straight or bent, whichever feels better. °· Sit-Ups or Curl-Ups. Bend your knees 90 degrees. Start with tilting your pelvis, and do a partial, slow sit-up, lifting your trunk only 30 to 45 degrees off the floor. Take at least 2 to 3 seconds for each sit-up. Do not do sit-ups with your knees out straight. If partial sit-ups are difficult, simply do the above but with only tightening your abdominal muscles and holding it as directed. °· Hip-Lift. Lie on your back with your knees flexed 90 degrees. Push down with your feet and shoulders as you raise your hips a couple inches off the floor; hold for 10 seconds, repeat 5 to 10 times. °· Back arches. Lie on your stomach, propping yourself up on bent elbows. Slowly press on your hands, causing an arch in your low  back. Repeat 3 to 5 times. Any initial stiffness and discomfort should lessen with repetition over time. °· Shoulder-Lifts. Lie face down with arms beside your body. Keep hips and torso pressed to floor as you slowly lift your head and shoulders off the floor. °Do not overdo your exercises, especially in the beginning. Exercises may cause you some mild back discomfort which lasts for a few minutes; however, if the pain is more severe, or lasts for more than 15 minutes, do not continue exercises until you see your caregiver. Improvement with exercise therapy for back problems is slow.  °See your caregivers for assistance with developing a proper back exercise program. °Document Released: 12/28/2004 Document Revised: 02/12/2012 Document Reviewed: 09/21/2011 °ExitCare® Patient Information ©2015 [employer]. This information is not intended to replace advice given to you by your health care provider. Make sure you discuss any questions you have with your health care provider. ° ° ° °Emergency Department Resource Guide °1) Find a Doctor and Pay Out of Pocket °Although you won't have to find out who is covered by your insurance plan, it is a good idea to ask around and get recommendations. You will then need to call the office and see if the doctor you have chosen will accept you as a new patient and what types of options they offer for patients who are self-pay. Some doctors offer discounts or will set up payment plans for their patients who do not   have insurance, but you will need to ask so you aren't surprised when you get to your appointment. ° °2) Contact Your Local Health Department °Not all health departments have doctors that can see patients for sick visits, but many do, so it is worth a call to see if yours does. If you don't know where your local health department is, you can check in your phone book. The CDC also has a tool to help you locate your state's health department, and many state websites also have  listings of all of their local health departments. ° °3) Find a Walk-in Clinic °If your illness is not likely to be very severe or complicated, you may want to try a walk in clinic. These are popping up all over the country in pharmacies, drugstores, and shopping centers. They're usually staffed by nurse practitioners or physician assistants that have been trained to treat common illnesses and complaints. They're usually fairly quick and inexpensive. However, if you have serious medical issues or chronic medical problems, these are probably not your best option. ° °No Primary Care Doctor: °- Call [facility] at  832-8000 - they can help you locate a primary care doctor that  accepts your insurance, provides certain services, etc. °- Physician Referral Service- 1-800-533-3463 ° °Chronic Pain Problems: °Organization         Address  Phone   Notes  °[facility]  (336) 297-2271 Patients need to be referred by their primary care doctor.  ° °Medication Assistance: °Organization         Address  Phone   Notes  °[place] [facility] [address] °[place], [place] [address] (336) 641-8030 --Must be a resident of [place] °-- Must have NO insurance coverage whatsoever (no Medicaid/ Medicare, etc.) °-- The pt. MUST have a primary care doctor that directs their care regularly and follows them in the community °  °[facility]  (866) 331-1348   °[employer]  (888) 892-1162   ° °Agencies that provide inexpensive medical care: °Organization         Address  Phone   Notes  °[facility]  (336) 832-8035   °[facility]    (336) 832-7272   °[facility] [address] °[place], [place] [address] (336) 832-4777   °[facility] [address], °[place] (336) 271-4999   °[facility]    (336) 373-0678   °[facility]    (336) 272-1050   °[facility] ° [address], [place] Phone:  (336) 832-4444, Fax:  (336) 832-4440 Hours of Operation:  9 am - 6 pm, M-F.  Also accepts Medicaid/Medicare and self-pay.  °[facility] ° [address], [place] Phone: (336) 832-3150, Fax: (336) 832-3151. Hours of Operation:  8:30 am - 5:30 pm, M-F.  Also accepts Medicaid and self-pay.  °[facility] [place] [address], [place] Phone: (336) 878-6027   °[facility] [address], [place], [place] (336)723-1848, Ext. 123 Mondays & Thursdays: 7-9 AM.  First 15 patients are seen on a first come, first serve basis. °  ° °Medicaid-accepting [place] Providers: ° °Organization         Address  Phone   Notes  °[facility] [address], [place] (336) 641-2100 Also accepts self-pay patients.  °[facility] [address], [place] ° (336) 856-9996   °[facility] [address] [place] [address], [place] (336) 288-8857   °[facility] [address] [place] [address], [place] (336) 299-7000   °Veita Bland [address], [place]  ° (336) 373-1557 Only accepts [place] Access Medicaid patients after they have their name applied to their card.  ° °Self-Pay (no insurance) in [place]: ° °Organization         Address  Phone   Notes  °Sickle Cell Patients, [facility] [address], [place] (336) 832-1970   °[facility] [address], [place] (336) 832-4400   °[facility] ° [address],  (336) 992-4800   °[facility]/Dr. Osei-Bonsu ° [address] [place] [address], [place] or [address], [place] (336) 841-8500 Phone number for both [place] and [place] locations is the same.  °[facility] [address], [place] (336) 299-0000   °[facility] [address] [place] [address],  [place] or [address] (336) 852-7530 °(336) 878-2260   °[facility] [address], [place] (336) 350-1642, phone; (336) 294-5005, fax Sees patients 1st and 3rd Saturday of every month.  Must not qualify for public or private insurance (i.e. Medicaid, Medicare, [place] Health Choice, Veterans' Benefits) • Household income should be no more than 200% of the poverty level •The clinic cannot treat you if you are pregnant or think you are pregnant • Sexually transmitted diseases are not treated at the clinic.  ° ° °Dental Care: °Organization         Address  Phone  Notes  °[facility] [address], [place] (336) 641-6152 Accepts children up to age 21 who are enrolled in Medicaid or [place] Health Choice; pregnant women with a Medicaid card; and children who have applied for Medicaid or [place] Health Choice, but were declined, whose parents can pay a reduced fee at time of service.  °[facility] [place]  [address], [place] (336) 641-7733 Accepts children up to age 21 who are enrolled in Medicaid or [place] Health Choice; pregnant women with a Medicaid card; and children who have applied for Medicaid or  Health Choice, but were declined, whose parents can pay a reduced fee at time of service.  °[facility] ° [address],  (336) 641-4533 Patients are seen by appointment only. Walk-ins are not accepted. [facility] will see patients 18 years of age and older. °Monday - Tuesday (8am-5pm) °Most Wednesdays (8:30-5pm) °$30 per visit, cash only  °[facility] ° [address], [place] (336) 641-4533 Patients are seen by appointment only. Walk-ins are not accepted. [facility] will see patients 18 years of age and older. °One Wednesday Evening (Monthly: Volunteer Based).  $30 per visit, cash only  °[facility]  (919) 537-3737 for adults; Children under age 4, call Graduate Pediatric Dentistry at (919) [phone]. Children aged 4-14, please call (919) [phone] to request a pediatric application. ° Dental services are provided in all areas of dental care including fillings, crowns and bridges, complete and partial dentures, implants, gum treatment, root canals, and extractions. Preventive care is also provided. Treatment is provided to both adults and children. °Patients are selected   via a lottery and there is often a waiting list. °  °[facility] [address], °[place] ° (336) 763-8833 [email or web address] °  °[facility] [address], [place], [place] (336)723-1848, Ext. 123 Second and Fourth Thursday of each month, opens at 6:30 AM; Clinic ends at 9 AM.  Patients are seen on a first-come first-served basis, and a limited number are seen during each clinic.  ° °[facility] ° [address], [place], [place] (336) 723-7904   Eligibility Requirements °You must have lived in [place], [place], or [place] counties for at least the last three months. °  You cannot be eligible for state or federal sponsored healthcare insurance, including Veterans Administration, Medicaid, or Medicare. °  You generally cannot be eligible for healthcare insurance through your employer.  °  How to apply: °Eligibility screenings are held every Tuesday and Wednesday afternoon from 1:00 pm until 4:00 pm. You do not need an appointment for the interview!  °[facility] [address], [place], [place] 336-631-2330   °[facility]  336-342-8273   °[facility]  336-703-3100   °[facility]  336-570-6415   ° °Behavioral Health Resources in the Community: °Intensive Outpatient Programs °Organization         Address  Phone  Notes  °[facility] [address], [place], [place] 336-878-6098     °[facility] [address], [place], [place] 336-832-9800   °[facility] [address], [place], [place] ° 336-882-2125   °[facility] [address],  °[place], [place] 1-800-853-5163 or 336-641-4981   °Substance Abuse Resources °Organization         Address  Phone  Notes  °[facility]  336-882-2125   °[facility]  336-784-9470   °[facility]  336-285-9073   °[facility]  336-845-3988   °Residential & Outpatient Substance Abuse Program  1-800-659-3381   °Psychological Services °Organization         Address  Phone  Notes  °[facility]  336- 832-9600   °[facility]  336- 378-7881   °[facility] [address], [place] 1-800-853-5163 or 336-641-4981   ° °Mobile Crisis Teams °Organization         Address  Phone  Notes  °[facility], Mobile Crisis Care Unit  1-877-626-1772   °Assertive °[facility] ° [address] [place], [place] 336-834-9664   °Sharon DeEsch [address] °[place] 336-554-5454   ° °Self-Help/Support Groups °Organization         Address  Phone             Notes  °[facility] - variety of support groups  336- 373-1402 Call for more information  °Narcotics Anonymous (NA), [facility] [address], °[place]  2 meetings at this location  ° °Residential Treatment Programs °Organization         Address  Phone  Notes  °[facility] [address],    ° [place]  1-866-801-8205   °[facility] ° [address], [place], [place] 704-293-8524   °[facility] [address], [place] 336-845-3988 Admissions: 8am-3pm M-F  °[facility] [address]    °[place],  336-841-1104   °[facility] [address], [place], [place] 336-379-7146   °[facility] [address]  °[place],  [place] 336-285-9073   °[facility] - Intensive Outpatient [address], [place], [place] 336-852-3033   °[facility] [address]  °[place], [place] 1-877-615-2722 or 336-784-9470   °[facility] [address] [place], [place] 336-227-7417 Accepts Medicaid  °[facility] [address]  °[place] 1-800-659-3381 Substance Abuse/Addiction Treatment  ° °[place] Behavioral Health Resources °Organization         Address  Phone  Notes  °[facility]  (888) 581-9988   °Julie Brannon, PhD [address] [place], [place]   (336) 349-5553 or (336) 951-0000   °[facility]   [address] °[place], [place] (336) 349-4454   °[facility] [address], [place], [place] (336) 342-8316 Insurance/Medicaid/sponsorship through [facility]  °[facility] [address]                                    [place], [place] (336) 342-8316 Therapy/tele-psych/case  °[facility] [address]  ° [place], [place] (336) 349-2233    °Dr. Arfeen  (336) 349-4544   °[facility] [place]  [employer] [place] [facility] 1) [address],  °2) [address], [place] °3)  [address], [place] (336) 349-3220 °(336) 342-7768 ° °(336) 342-8140   °[place] [facility] (336) 342-1394 or (336) 342-3537 (After Hours)    ° ° ° °

## 2014-11-20 NOTE — ED Notes (Signed)
The pt IS C/O  Lower back pain for 2 days.  Hx of back pain

## 2015-05-11 ENCOUNTER — Emergency Department (HOSPITAL_COMMUNITY)
Admission: EM | Admit: 2015-05-11 | Discharge: 2015-05-11 | Disposition: A | Discharge status: Home / Self Care | Payer: Self-pay | Attending: Emergency Medicine | Admitting: Emergency Medicine

## 2015-05-11 ENCOUNTER — Encounter (HOSPITAL_COMMUNITY): Payer: Self-pay | Admitting: Physical Medicine and Rehabilitation

## 2015-05-11 DIAGNOSIS — F319 Bipolar disorder, unspecified: Secondary | ICD-10-CM | POA: Insufficient documentation

## 2015-05-11 DIAGNOSIS — J45909 Unspecified asthma, uncomplicated: Secondary | ICD-10-CM | POA: Insufficient documentation

## 2015-05-11 DIAGNOSIS — Z720 Tobacco use: Secondary | ICD-10-CM | POA: Insufficient documentation

## 2015-05-11 DIAGNOSIS — Z79899 Other long term (current) drug therapy: Secondary | ICD-10-CM | POA: Insufficient documentation

## 2015-05-11 DIAGNOSIS — I10 Essential (primary) hypertension: Secondary | ICD-10-CM | POA: Insufficient documentation

## 2015-05-11 DIAGNOSIS — Z791 Long term (current) use of non-steroidal anti-inflammatories (NSAID): Secondary | ICD-10-CM | POA: Insufficient documentation

## 2015-05-11 DIAGNOSIS — M25511 Pain in right shoulder: Secondary | ICD-10-CM | POA: Insufficient documentation

## 2015-05-11 MED ORDER — NAPROXEN 500 MG PO TABS: 500.0000 mg | ORAL_TABLET | Freq: Two times a day (BID) | ORAL | Status: DC

## 2015-05-11 MED ORDER — HYDROCODONE-ACETAMINOPHEN 5-325 MG PO TABS
1.0000 | ORAL_TABLET | Freq: Once | ORAL | Status: AC
  Administered 2015-05-11: 1 via ORAL
  Filled 2015-05-11: qty 1

## 2015-05-11 NOTE — ED Notes (Signed)
Pt reports right shoulder injury in 2007. Pain came back yesterday. Pt has been taking motrin and hydrocortisone since injury in 2007. No relief from pain medication. Pt reports he can move it but it sometimes goes limp.

## 2015-05-11 NOTE — ED Provider Notes (Signed)
CSN: [id number]     Arrival date & time 05/11/15  1745 History  This chart was scribed for non-physician practitioner, Felicie Morn, NP working with Derwood Kaplan, MD by Gwenyth Ober, ED scribe. This patient was seen in room TR09C/TR09C and the patient's care was started at 7:32 PM   Chief Complaint  Patient presents with  . Shoulder Pain    The history is provided by the patient. No language interpreter was used.   HPI Comments: Ricardo Brooks is a 59 y.o. male with a history of right shoulder injury who presents to the Emergency Department complaining of an acute-on-chronic episode of constant, moderate right shoulder pain that started 1 day ago. Pt states intermittent weakness of his right arm as an associated symptom. He has tried Motrin with no relief. Pt has a history of right shoulder injury in 2007 which has been previously treated with Hydrocodone and Tramadol. He denies any new injuries. Pt also denies neck pain.  Past Medical History  Diagnosis Date  . Asthma   . Hypertension   . Bipolar 1 disorder    Past Surgical History  Procedure Laterality Date  . Orif right ankle Right 1985   History reviewed. No pertinent family history. History  Substance Use Topics  . Smoking status: Current Every Day Smoker -- 0.50 packs/day for 18 years    Types: Cigarettes  . Smokeless tobacco: Not on file  . Alcohol Use: Yes     Comment: 2 drinks weekly     Review of Systems  Musculoskeletal: Positive for arthralgias. Negative for neck pain.  Neurological: Positive for weakness.  All other systems reviewed and are negative.  Allergies  Methocarbamol  Home Medications   Prior to Admission medications   Medication Sig Start Date End Date Taking? Authorizing Provider  acetaminophen (TYLENOL) 500 MG tablet Take 1,000 mg by mouth every 6 (six) hours as needed for mild pain.     Historical Provider, MD  ARIPiprazole (ABILIFY) 15 MG tablet Take 7.5 mg by mouth daily.    Historical  Provider, MD  Cholecalciferol (VITAMIN D PO) Take 1 tablet by mouth daily.    Historical Provider, MD  Cyanocobalamin (VITAMIN B-12 PO) Take 1 tablet by mouth daily.    Historical Provider, MD  cyclobenzaprine (FLEXERIL) 10 MG tablet Take 1 tablet (10 mg total) by mouth 3 (three) times daily as needed for muscle spasms. 11/20/14   [name], PA-C  ibuprofen (ADVIL,MOTRIN) 800 MG tablet Take 1 tablet (800 mg total) by mouth 3 (three) times daily. 11/20/14   [name], PA-C  loratadine (CLARITIN) 10 MG tablet Take 10 mg by mouth daily as needed for allergies.  09/09/13   [name], MD  meclizine (ANTIVERT) 25 MG tablet Take 1 tablet (25 mg total) by mouth 3 (three) times daily as needed for dizziness. 08/13/14   [name], MD  ondansetron (ZOFRAN) 4 MG tablet Take 1 tablet (4 mg total) by mouth every 6 (six) hours. 07/09/14   [name], PA-C  traMADol (ULTRAM) 50 MG tablet Take 1 tablet (50 mg total) by mouth every 6 (six) hours as needed. 07/06/14   [name], PA-C   BP 139/77 mmHg  Pulse 72  Temp(Src) 98.6 F (37 C) (Oral)  Resp 16  Ht  (1.753 m)  Wt 150 lb (68.04 kg)  BMI 22.14 kg/m2  SpO2 97% Physical Exam  Constitutional: He appears well-developed and well-nourished. No distress.  HENT:  Head: Normocephalic and atraumatic.  Eyes: Conjunctivae  and EOM are normal.  Neck: Neck supple. No tracheal deviation present.  Cardiovascular: Normal rate, regular rhythm and normal heart sounds.   Pulmonary/Chest: Effort normal and breath sounds normal. No respiratory distress.  Musculoskeletal: Normal range of motion.  Good ROM No strength or neurologic deficit No recent injury  Skin: Skin is warm and dry.  Psychiatric: He has a normal mood and affect. His behavior is normal.  Nursing note and vitals reviewed.  ED Course  Procedures   DIAGNOSTIC STUDIES: Oxygen Saturation is 97% on RA, normal by my interpretation.    COORDINATION OF CARE: 7:35 PM Discussed  treatment plan with pt which includes pain management, anti-inflammatories and follow-up with orthopedics for continued pain. Ad[facility]. Pt agreed to plan.   Labs Review Labs Reviewed - No data to display  Imaging Review No results found.   EKG Interpretation None      MDM   Final diagnoses:  None    Right shoulder pain. Anti-inflammatory.   I personally performed the services described in this documentation, which was scribed in my presence. The recorded information has been reviewed and is accurate.    Felicie Mornavid Jennifermarie Franzen, NP 05/12/15 [id number]  Derwood KaplanAnkit Nanavati, MD 05/13/15 [id number]

## 2015-05-11 NOTE — ED Notes (Signed)
Pt presents to department for evaluation of chronic R shoulder pain from injury in 2007. Reports pain increased today. 8/10 pain upon arrival to ED. NAD

## 2015-05-11 NOTE — Discharge Instructions (Signed)
Trapezius Palsy   The trapezius is a large muscle of the upper back that helps to control the shoulder blade (scapula) and stabilize the spine. Trapezius palsy is a condition affecting the nervous system in the trapezius muscle. The condition results in pain and weakness in the back of the shoulder and upper back. Shoulder function is also decreased, because the scapula contains the socket for "ball and-socket" joint of the shoulder. Trapezius palsy is caused by injury to the spinal accessory nerve, which connects to the trapezius muscle. SYMPTOMS   Pain that is achy and in the shoulder and/or upper back.  Decreased shoulder function and/or strength.  Scapula protrudes (winging of the scapula).  Back pain when sitting in a chair with a hard back due to the scapula winging and placing pressure on the back of the chair.  Shrinkage (atrophy) of the trapezius muscle, this may or may not make the neckline look asymmetric.  Shoulder drooping. CAUSES  Trapezius palsy is caused by damage to the spinal accessory nerve, which is connected to the trapezius muscle. This condition is often associated with acromioclavicular [facility](AC) or sternoclavicular subluxation (adjacent bones becoming out of proper alignment, but the joint surfaces are still touching). Common mechanisms of injury include:  Direct trauma to the shoulder (like being hit with an object or falling on the shoulder).  Complication of previous surgery that causes nerve damage. RISK INCREASES WITH:  Contact sports (i.e., football, rugby, or lacrosse).  Surgery around the neck.  Poor strength and flexibility. PREVENTION   Warm up and stretch properly before activity.  Maintain physical fitness:  Strength, flexibility, and endurance.  Cardiovascular fitness. PROGNOSIS  Trapezius palsy usually resolves spontaneously within 3 to 6 months. Nonsurgical (conservative) treatments may help decrease the severity of symptoms.  RELATED  COMPLICATIONS   Permanent nerve damage, including pain, numbness, tingling, or weakness.  Inability to compete at a high level.  Recurrent symptoms that result in a chronic problem.  Shoulder stiffness. TREATMENT Treatment initially involves resting from any activities that aggravate the symptoms, and the use of ice and medications to help reduce pain and inflammation. The use of strengthening and stretching exercises may help reduce pain with activity. These exercises may be performed at home or with referral to a therapist. A therapist may recommend further treatment, such as:  The use of electric current to simulate the nerves (transcutaneous electronic nerve stimulation [TENS]).  Ultrasound. If symptoms are severe, your caregiver may recommend you wear a brace to decrease discomfort. If symptoms persist for more than 6 months despite nonsurgical treatment, surgery may be recommended (uncommon). MEDICATION   If pain medication is necessary, then nonsteroidal anti-inflammatory medications, such as aspirin and ibuprofen, or other minor pain relievers, such as acetaminophen, are often recommended.  Do not take pain medication for 7 days before surgery.  Prescription pain relievers may be given if deemed necessary by your caregiver. Use only as directed and only as much as you need. HEAT AND COLD  Cold treatment (icing) relieves pain and reduces inflammation. Cold treatment should be applied for 10 to 15 minutes every 2 to 3 hours for inflammation and pain and immediately after any activity that aggravates your symptoms. Use ice packs or massage the area with a piece of ice (ice massage).  Heat treatment may be used prior to performing the stretching and strengthening activities prescribed by your caregiver, physical therapist, or athletic trainer. Use a heat pack or soak your injury in warm water. SEEK MEDICAL CARE  IF:  Treatment seems to offer no benefit, or the condition  worsens.  Any medications produce adverse side effects.  Document Released: 11/20/2005 Document Revised: 04/06/2014 Document Reviewed: 03/04/2009 [facility] Patient Information 2015 [place], [place]. This information is not intended to replace advice given to you by your health care provider. Make sure you discuss any questions you have with your health care provider.

## 2015-12-10 ENCOUNTER — Emergency Department (HOSPITAL_COMMUNITY)
Admission: EM | Admit: 2015-12-10 | Discharge: 2015-12-10 | Disposition: A | Discharge status: Home / Self Care | Payer: Self-pay | Attending: Emergency Medicine | Admitting: Emergency Medicine

## 2015-12-10 ENCOUNTER — Encounter (HOSPITAL_COMMUNITY): Payer: Self-pay | Admitting: Emergency Medicine

## 2015-12-10 DIAGNOSIS — M545 Low back pain, unspecified: Secondary | ICD-10-CM

## 2015-12-10 DIAGNOSIS — F17210 Nicotine dependence, cigarettes, uncomplicated: Secondary | ICD-10-CM | POA: Insufficient documentation

## 2015-12-10 DIAGNOSIS — M25511 Pain in right shoulder: Secondary | ICD-10-CM | POA: Insufficient documentation

## 2015-12-10 DIAGNOSIS — Z79899 Other long term (current) drug therapy: Secondary | ICD-10-CM | POA: Insufficient documentation

## 2015-12-10 DIAGNOSIS — I10 Essential (primary) hypertension: Secondary | ICD-10-CM | POA: Insufficient documentation

## 2015-12-10 DIAGNOSIS — G8929 Other chronic pain: Secondary | ICD-10-CM | POA: Insufficient documentation

## 2015-12-10 DIAGNOSIS — F319 Bipolar disorder, unspecified: Secondary | ICD-10-CM | POA: Insufficient documentation

## 2015-12-10 DIAGNOSIS — J45909 Unspecified asthma, uncomplicated: Secondary | ICD-10-CM | POA: Insufficient documentation

## 2015-12-10 DIAGNOSIS — Z791 Long term (current) use of non-steroidal anti-inflammatories (NSAID): Secondary | ICD-10-CM | POA: Insufficient documentation

## 2015-12-10 MED ORDER — HYDROCODONE-ACETAMINOPHEN 5-325 MG PO TABS
1.0000 | ORAL_TABLET | Freq: Once | ORAL | Status: AC
  Administered 2015-12-10: 1 via ORAL
  Filled 2015-12-10: qty 1

## 2015-12-10 MED ORDER — NAPROXEN 500 MG PO TABS: 500.0000 mg | ORAL_TABLET | Freq: Two times a day (BID) | ORAL | Status: DC

## 2015-12-10 NOTE — ED Provider Notes (Signed)
History  By signing my name below, I, Karle PlumberJennifer Tensley, attest that this documentation has been prepared under the direction and in the presence of Fayrene HelperBowie Deitrick Ferreri, PA-C. Electronically Signed: Karle PlumberJennifer Tensley, ED Scribe. 12/10/2015. 5:15 PM.  Chief Complaint  Patient presents with  . Shoulder Pain  . Back Pain   The history is provided by the patient and medical records. No language interpreter was used.    HPI Comments:  Ricardo Brooks is a 60 y.o. male who presents to the Emergency Department complaining of severe, sharp, aching low back pain that has been ongoing for several years. He also reports severe intermittent right shoulder pain. He states this pain is chronic and occurs everyday. He states he was being followed by a PCP and was being treated with Hydrocodone, Tylenol #3 and Tramadol. He states he no longer has the money to follow up with his PCP at this time. He reports doing some exercises for his low back to help with the pain. He denies modifying factors. He denies fever, chills, nausea, vomiting, numbness, tingling or weakness of any extremity.   Past Medical History  Diagnosis Date  . Asthma   . Hypertension   . Bipolar 1 disorder [facility](HCC)    Past Surgical History  Procedure Laterality Date  . Orif right ankle Right 1985   No family history on file. Social History  Substance Use Topics  . Smoking status: Current Every Day Smoker -- 0.50 packs/day for 18 years    Types: Cigarettes  . Smokeless tobacco: None  . Alcohol Use: Yes     Comment: 2 drinks weekly     Review of Systems  Musculoskeletal: Positive for myalgias and back pain.    Allergies  Methocarbamol  Home Medications   Prior to Admission medications   Medication Sig Start Date End Date Taking? Authorizing Provider  acetaminophen (TYLENOL) 500 MG tablet Take 1,000 mg by mouth every 6 (six) hours as needed for mild pain.     Historical Provider, MD  ARIPiprazole (ABILIFY) 15 MG tablet Take 7.5 mg by  mouth daily.    Historical Provider, MD  Cholecalciferol (VITAMIN D PO) Take 1 tablet by mouth daily.    Historical Provider, MD  Cyanocobalamin (VITAMIN B-12 PO) Take 1 tablet by mouth daily.    Historical Provider, MD  cyclobenzaprine (FLEXERIL) 10 MG tablet Take 1 tablet (10 mg total) by mouth 3 (three) times daily as needed for muscle spasms. 11/20/14   [name], PA-C  ibuprofen (ADVIL,MOTRIN) 800 MG tablet Take 1 tablet (800 mg total) by mouth 3 (three) times daily. 11/20/14   [name], PA-C  loratadine (CLARITIN) 10 MG tablet Take 10 mg by mouth daily as needed for allergies.  09/09/13   Linwood DibblesJon Knapp, MD  meclizine (ANTIVERT) 25 MG tablet Take 1 tablet (25 mg total) by mouth 3 (three) times daily as needed for dizziness. 08/13/14   Dione Boozeavid Glick, MD  naproxen (NAPROSYN) 500 MG tablet Take 1 tablet (500 mg total) by mouth 2 (two) times daily. 12/10/15   Fayrene HelperBowie Veer Elamin, PA-C  ondansetron (ZOFRAN) 4 MG tablet Take 1 tablet (4 mg total) by mouth every 6 (six) hours. 07/09/14   Marlon Peliffany Greene, PA-C  traMADol (ULTRAM) 50 MG tablet Take 1 tablet (50 mg total) by mouth every 6 (six) hours as needed. 07/06/14   Renne CriglerJoshua Geiple, PA-C   Triage Vitals: BP 148/64 mmHg  Pulse 71  Temp(Src) 98.3 F (36.8 C) (Oral)  Resp 20  SpO2 100% Physical Exam  Constitutional: He is oriented to person, place, and time. He appears well-developed and well-nourished.  HENT:  Head: Normocephalic and atraumatic.  Eyes: EOM are normal.  Neck: Normal range of motion.  Cardiovascular: Normal rate and intact distal pulses.   DP and PT pulses intact bilaterally.  Pulmonary/Chest: Effort normal.  Musculoskeletal: Normal range of motion. He exhibits tenderness.  Tenderness to palpation of lumbar spine.  Mild tenderness to palpation of right shoulder with full ROM.  Neurological: He is alert and oriented to person, place, and time.  Intact patellar reflexes. Normal grip strength of right hand.  Skin: Skin is warm and  dry. No rash noted.  No overlying skin changes and no rash noted.  Psychiatric: He has a normal mood and affect. His behavior is normal.  Nursing note and vitals reviewed.   ED Course  Procedures (including critical care time) DIAGNOSTIC STUDIES: Oxygen Saturation is 100% on RA, normal by my interpretation.   COORDINATION OF CARE: 5:13 PM- Will prescribe NSAID and give dose of Vicodin prior to discharge. Pt verbalizes understanding and agrees to plan.  Medications  HYDROcodone-acetaminophen (NORCO/VICODIN) 5-325 MG per tablet 1 tablet (1 tablet Oral Given 12/10/15 1712)     MDM   Final diagnoses:  Chronic right shoulder pain  Chronic low back pain    Patient with back pain.  No neurological deficits and normal neuro exam.  Patient is ambulatory.  No loss of bowel or bladder control.  No concern for cauda equina.  No fever, night sweats, weight loss, h/o cancer, IVDA, no recent procedure to back. No urinary symptoms suggestive of UTI.  Supportive care and return precaution discussed. Appears safe for discharge at this time. Follow up as indicated in discharge paperwork.   BP 148/64 mmHg  Pulse 71  Temp(Src) 98.3 F (36.8 C) (Oral)  Resp 20  SpO2 100%   I personally performed the services described in this documentation, which was scribed in my presence. The recorded information has been reviewed and is accurate.       Fayrene Helper, PA-C 12/10/15 1719  Lyndal Pulley, MD 12/12/15 [phone]

## 2015-12-10 NOTE — ED Notes (Signed)
Patient coming from home with c/o of chronic shoulder and back pain.

## 2015-12-10 NOTE — ED Notes (Signed)
See PA note for secondary assessment.   

## 2015-12-10 NOTE — Discharge Instructions (Signed)

## 2016-01-23 ENCOUNTER — Encounter (HOSPITAL_COMMUNITY): Payer: Self-pay | Admitting: Emergency Medicine

## 2016-01-23 ENCOUNTER — Emergency Department (HOSPITAL_COMMUNITY)
Admission: EM | Admit: 2016-01-23 | Discharge: 2016-01-23 | Disposition: A | Discharge status: Home / Self Care | Payer: No Typology Code available for payment source | Attending: Emergency Medicine | Admitting: Emergency Medicine

## 2016-01-23 DIAGNOSIS — F319 Bipolar disorder, unspecified: Secondary | ICD-10-CM | POA: Insufficient documentation

## 2016-01-23 DIAGNOSIS — I10 Essential (primary) hypertension: Secondary | ICD-10-CM | POA: Insufficient documentation

## 2016-01-23 DIAGNOSIS — M545 Low back pain, unspecified: Secondary | ICD-10-CM

## 2016-01-23 DIAGNOSIS — Z79899 Other long term (current) drug therapy: Secondary | ICD-10-CM | POA: Insufficient documentation

## 2016-01-23 DIAGNOSIS — Z791 Long term (current) use of non-steroidal anti-inflammatories (NSAID): Secondary | ICD-10-CM | POA: Insufficient documentation

## 2016-01-23 DIAGNOSIS — J45909 Unspecified asthma, uncomplicated: Secondary | ICD-10-CM | POA: Insufficient documentation

## 2016-01-23 DIAGNOSIS — G8929 Other chronic pain: Secondary | ICD-10-CM | POA: Insufficient documentation

## 2016-01-23 MED ORDER — AZITHROMYCIN 250 MG PO TABS
1000.0000 mg | ORAL_TABLET | Freq: Once | ORAL | Status: AC
  Administered 2016-01-23: 1000 mg via ORAL
  Filled 2016-01-23: qty 4

## 2016-01-23 MED ORDER — NAPROXEN 250 MG PO TABS
500.0000 mg | ORAL_TABLET | Freq: Once | ORAL | Status: AC
  Administered 2016-01-23: 500 mg via ORAL
  Filled 2016-01-23: qty 2

## 2016-01-23 MED ORDER — CEFTRIAXONE SODIUM 250 MG IJ SOLR
250.0000 mg | Freq: Once | INTRAMUSCULAR | Status: AC
  Administered 2016-01-23: 250 mg via INTRAMUSCULAR
  Filled 2016-01-23: qty 250

## 2016-01-23 MED ORDER — NAPROXEN 500 MG PO TABS: 500.0000 mg | ORAL_TABLET | Freq: Two times a day (BID) | ORAL | Status: DC

## 2016-01-23 MED ORDER — STERILE WATER FOR INJECTION IJ SOLN
INTRAMUSCULAR | Status: AC
  Administered 2016-01-23: 10 mL
  Filled 2016-01-23: qty 10

## 2016-01-23 NOTE — ED Notes (Signed)
Pt states he has chronic back pain he was seen las 3 weeks for this pain and got some prescriptions for pain medication, pt states he run out of his pain medication and he is not able to afford to see his PCP at this time.  Pt having 7/10 back pain at this time.

## 2016-01-23 NOTE — ED Provider Notes (Signed)
CSN: [id number]     Arrival date & time 01/23/16  2021 History  By signing my name below, I, Ricardo Brooks, attest that this documentation has been prepared under the direction and in the presence of [employer], PA-C. Electronically Signed: [name], ED Scribe. 01/23/2016. 9:42 PM.      Chief Complaint  Patient presents with  . Back Pain   Patient is a 60 y.o. male presenting with back pain. The history is provided by the patient. No language interpreter was used.  Back Pain  HPI Comments: Ricardo Brooks is a 59 y.o. male who presents to the Emergency Department complaining of chronic low back pain that has recently worsened within the last 3 weeks. Pt describes the pain that he has as lower lumbar spasms. Pt does report intermittent paresthesia that radiate down form his back that occur 2-3x times per week. Pt states that he was seen about 3 weeks prior and was prescribed naproxen that provided relief but states that he has recently ran out. Pt also presents with a lidocaine patch on his back that is providing some relief. Denies saddle anesthesia, fever, numbness or weakness; no back pain red flags  Pt is also requesting STD screening after having unprotected sex with a new partner 2 weeks prior. Pt denies any dysuria, penile swelling, or penile discharge.   Past Medical History  Diagnosis Date  . Asthma   . Hypertension   . Bipolar 1 disorder [name])    Past Surgical History  Procedure Laterality Date  . Orif right ankle Right 1985   History reviewed. No pertinent family history. Social History  Substance Use Topics  . Smoking status: Current Every Day Smoker -- 0.50 packs/day for 18 years    Types: Cigarettes  . Smokeless tobacco: None  . Alcohol Use: Yes     Comment: 2 drinks weekly     Review of Systems  Musculoskeletal: Positive for back pain.  All other systems reviewed and are negative.    Allergies  Methocarbamol  Home Medications   Prior to Admission  medications   Medication Sig Start Date End Date Taking? Authorizing Provider  acetaminophen (TYLENOL) 500 MG tablet Take 1,000 mg by mouth every 6 (six) hours as needed for mild pain.     Historical Provider, MD  ARIPiprazole (ABILIFY) 15 MG tablet Take 7.5 mg by mouth daily.    Historical Provider, MD  Cholecalciferol (VITAMIN D PO) Take 1 tablet by mouth daily.    Historical Provider, MD  Cyanocobalamin (VITAMIN B-12 PO) Take 1 tablet by mouth daily.    Historical Provider, MD  cyclobenzaprine (FLEXERIL) 10 MG tablet Take 1 tablet (10 mg total) by mouth 3 (three) times daily as needed for muscle spasms. 11/20/14   [name], PA-C  ibuprofen (ADVIL,MOTRIN) 800 MG tablet Take 1 tablet (800 mg total) by mouth 3 (three) times daily. 11/20/14   [name], PA-C  loratadine (CLARITIN) 10 MG tablet Take 10 mg by mouth daily as needed for allergies.  09/09/13   [name], MD  meclizine (ANTIVERT) 25 MG tablet Take 1 tablet (25 mg total) by mouth 3 (three) times daily as needed for dizziness. 08/13/14   [name], MD  naproxen (NAPROSYN) 500 MG tablet Take 1 tablet (500 mg total) by mouth 2 (two) times daily. 01/23/16   [name], PA-C  ondansetron (ZOFRAN) 4 MG tablet Take 1 tablet (4 mg total) by mouth every 6 (six) hours. 07/09/14   [name], PA-C  traMADol (ULTRAM) 50 MG tablet Take 1 tablet (50 mg total) by mouth every 6 (six) hours as needed. 07/06/14   [name], PA-C   BP 149/87 mmHg  Pulse 64  Temp(Src) 99.1 F (37.3 C) (Oral)  Resp 18  Wt 70.308 kg  SpO2 98%   Physical Exam  Constitutional: He is oriented to person, place, and time. He appears well-developed and well-nourished. No distress.  HENT:  Head: Normocephalic and atraumatic.  Eyes: Conjunctivae and EOM are normal.  Neck: Normal range of motion. Neck supple. No tracheal deviation present.  Cardiovascular: Normal rate.   Pulmonary/Chest: Effort normal. No respiratory distress.  Musculoskeletal:  Normal range of motion. He exhibits tenderness. He exhibits no edema.  No C, T, or L spine tenderness to palpation. No obvious signs of trauma, deformity, infection, step-offs. Lung expansion normal. No scoliosis or kyphosis. Bilateral lower extremity strength 5 out of 5, sensation grossly intact, patellar reflexes 2+, pedal pulses 2+, Refill less than 3 seconds.  Minor tenderness to palpation of the bilateral lower lumbar soft tissue  Straight leg negative Ambulates without difficulty Waddells sign **  Neurological: He is alert and oriented to person, place, and time.  Skin: Skin is warm and dry. He is not diaphoretic.  Psychiatric: He has a normal mood and affect. His behavior is normal. Judgment and thought content normal.  Nursing note and vitals reviewed.   ED Course  Procedures (including critical care time) DIAGNOSTIC STUDIES: Oxygen Saturation is 98% on RA, normal by my interpretation.    COORDINATION OF CARE: 9:43 PM-Discussed treatment plan with pt at bedside and pt agreed to plan.     Labs Review Labs Reviewed  HIV ANTIBODY (ROUTINE TESTING)  RPR  GC/CHLAMYDIA PROBE AMP ([place]) NOT AT [facility]    Imaging Review No results found.    EKG Interpretation None      MDM   Final diagnoses:  Bilateral low back pain without sciatica    Labs: HIV, RPR, gonorrhea Chlamydia  Imaging:  Consults:  Therapeutics: Ceftriaxone, azithromycin, naproxen  Discharge Meds: Naproxen  Assessment/Plan: Patient presents with chronic back pain. No change in baseline back pain, no red flags. Patient requesting naproxen. Patient is encouraged follow-up with his primary care provider, he will be given a prescription of naproxen. Patient also reports STD exposure requesting STD prophylactic antibiotics. Patient given antibiotics as above. Patient given strict return precautions, verbalized understanding and agreement today's plan.       I personally performed the services  described in this documentation, which was scribed in my presence. The recorded information has been reviewed and is accurate.      [name], PA-C 01/23/16 2248  Azalia Bilis, MD 01/23/16 [phone]

## 2016-01-23 NOTE — Discharge Instructions (Signed)

## 2016-01-24 LAB — GC/CHLAMYDIA PROBE AMP (~~LOC~~) NOT AT ARMC
Chlamydia: NEGATIVE
Neisseria Gonorrhea: NEGATIVE

## 2016-01-24 LAB — HIV ANTIBODY (ROUTINE TESTING W REFLEX): HIV Screen 4th Generation wRfx: NONREACTIVE

## 2016-01-24 LAB — RPR: RPR Ser Ql: NONREACTIVE

## 2016-04-22 ENCOUNTER — Encounter (HOSPITAL_COMMUNITY): Payer: Self-pay

## 2016-04-22 ENCOUNTER — Emergency Department (HOSPITAL_COMMUNITY)
Admission: EM | Admit: 2016-04-22 | Discharge: 2016-04-22 | Disposition: A | Discharge status: Home / Self Care | Payer: No Typology Code available for payment source | Attending: Emergency Medicine | Admitting: Emergency Medicine

## 2016-04-22 DIAGNOSIS — S46911A Strain of unspecified muscle, fascia and tendon at shoulder and upper arm level, right arm, initial encounter: Secondary | ICD-10-CM | POA: Insufficient documentation

## 2016-04-22 DIAGNOSIS — Z791 Long term (current) use of non-steroidal anti-inflammatories (NSAID): Secondary | ICD-10-CM | POA: Insufficient documentation

## 2016-04-22 DIAGNOSIS — F17210 Nicotine dependence, cigarettes, uncomplicated: Secondary | ICD-10-CM | POA: Insufficient documentation

## 2016-04-22 DIAGNOSIS — Y998 Other external cause status: Secondary | ICD-10-CM | POA: Insufficient documentation

## 2016-04-22 DIAGNOSIS — S76011A Strain of muscle, fascia and tendon of right hip, initial encounter: Secondary | ICD-10-CM | POA: Insufficient documentation

## 2016-04-22 DIAGNOSIS — J45909 Unspecified asthma, uncomplicated: Secondary | ICD-10-CM | POA: Insufficient documentation

## 2016-04-22 DIAGNOSIS — Y9389 Activity, other specified: Secondary | ICD-10-CM | POA: Insufficient documentation

## 2016-04-22 DIAGNOSIS — F319 Bipolar disorder, unspecified: Secondary | ICD-10-CM | POA: Insufficient documentation

## 2016-04-22 DIAGNOSIS — Y9289 Other specified places as the place of occurrence of the external cause: Secondary | ICD-10-CM | POA: Insufficient documentation

## 2016-04-22 DIAGNOSIS — S93401A Sprain of unspecified ligament of right ankle, initial encounter: Secondary | ICD-10-CM | POA: Insufficient documentation

## 2016-04-22 DIAGNOSIS — Z79899 Other long term (current) drug therapy: Secondary | ICD-10-CM | POA: Insufficient documentation

## 2016-04-22 DIAGNOSIS — Z9889 Other specified postprocedural states: Secondary | ICD-10-CM | POA: Insufficient documentation

## 2016-04-22 DIAGNOSIS — I10 Essential (primary) hypertension: Secondary | ICD-10-CM | POA: Insufficient documentation

## 2016-04-22 DIAGNOSIS — W1839XA Other fall on same level, initial encounter: Secondary | ICD-10-CM | POA: Insufficient documentation

## 2016-04-22 MED ORDER — CYCLOBENZAPRINE HCL 10 MG PO TABS: 10.0000 mg | ORAL_TABLET | Freq: Two times a day (BID) | ORAL | Status: DC | PRN

## 2016-04-22 MED ORDER — IBUPROFEN 800 MG PO TABS: 800.0000 mg | ORAL_TABLET | Freq: Three times a day (TID) | ORAL | Status: DC

## 2016-04-22 MED ORDER — IBUPROFEN 400 MG PO TABS
800.0000 mg | ORAL_TABLET | Freq: Once | ORAL | Status: AC
  Administered 2016-04-22: 800 mg via ORAL
  Filled 2016-04-22: qty 2

## 2016-04-22 NOTE — ED Notes (Signed)
Declined W/C at D/C and was escorted to lobby by RN. 

## 2016-04-22 NOTE — Discharge Instructions (Signed)

## 2016-04-22 NOTE — ED Notes (Signed)
Pt reports right leg gave out and he fell on right side.  C/o right hip and ankle pain.  Ambulating in triage without difficulty.

## 2016-04-22 NOTE — ED Provider Notes (Signed)
History  By signing my name below, I, Earmon PhoenixJennifer Waddell, attest that this documentation has been prepared under the direction and in the presence of Fayrene HelperBowie Jearldean Gutt, PA-C. Electronically Signed: Earmon PhoenixJennifer Waddell, ED Scribe. 04/22/2016. 4:25 PM.  Chief Complaint  Patient presents with  . Hip Pain  . Ankle Pain   The history is provided by the patient and medical records. No language interpreter was used.    HPI Comments:  Ricardo CabalDavid Brooks is a 60 y.o. male who presents to the Emergency Department complaining of a fall that occurred last night. He reports his right leg gave out causing him to fall on his right side. He reports throbbing pain of the right hip and right ankle. He reports associated right shoulder pain as well. He reports having hardware in the right ankle for the past 25+ years. He has applied ice to the right ankle. Walking and ROM increase the pain. He denies alleviating factors. He denies head trauma, LOC, bruising, wounds, numbness, tingling or weakness of any extremity.   Past Medical History  Diagnosis Date  . Asthma   . Hypertension   . Bipolar 1 disorder [facility](HCC)    Past Surgical History  Procedure Laterality Date  . Orif right ankle Right 1985   History reviewed. No pertinent family history. Social History  Substance Use Topics  . Smoking status: Current Every Day Smoker -- 0.50 packs/day for 18 years    Types: Cigarettes  . Smokeless tobacco: None  . Alcohol Use: Yes     Comment: 2 drinks weekly     Review of Systems  Musculoskeletal: Positive for myalgias and arthralgias.  Skin: Negative for color change and wound.  Neurological: Negative for syncope, weakness and numbness.    Allergies  Methocarbamol  Home Medications   Prior to Admission medications   Medication Sig Start Date End Date Taking? Authorizing Provider  acetaminophen (TYLENOL) 500 MG tablet Take 1,000 mg by mouth every 6 (six) hours as needed for mild pain.     Historical Provider, MD   ARIPiprazole (ABILIFY) 15 MG tablet Take 7.5 mg by mouth daily.    Historical Provider, MD  Cholecalciferol (VITAMIN D PO) Take 1 tablet by mouth daily.    Historical Provider, MD  Cyanocobalamin (VITAMIN B-12 PO) Take 1 tablet by mouth daily.    Historical Provider, MD  cyclobenzaprine (FLEXERIL) 10 MG tablet Take 1 tablet (10 mg total) by mouth 3 (three) times daily as needed for muscle spasms. 11/20/14   [name], PA-C  ibuprofen (ADVIL,MOTRIN) 800 MG tablet Take 1 tablet (800 mg total) by mouth 3 (three) times daily. 11/20/14   [name], PA-C  loratadine (CLARITIN) 10 MG tablet Take 10 mg by mouth daily as needed for allergies.  09/09/13   Linwood DibblesJon Knapp, MD  meclizine (ANTIVERT) 25 MG tablet Take 1 tablet (25 mg total) by mouth 3 (three) times daily as needed for dizziness. 08/13/14   Dione Boozeavid Glick, MD  naproxen (NAPROSYN) 500 MG tablet Take 1 tablet (500 mg total) by mouth 2 (two) times daily. 01/23/16   Tinnie GensJeffrey Hedges, PA-C  ondansetron (ZOFRAN) 4 MG tablet Take 1 tablet (4 mg total) by mouth every 6 (six) hours. 07/09/14   Marlon Peliffany Greene, PA-C  traMADol (ULTRAM) 50 MG tablet Take 1 tablet (50 mg total) by mouth every 6 (six) hours as needed. 07/06/14   Renne CriglerJoshua Geiple, PA-C   Triage Vitals: BP 131/89 mmHg  Pulse 64  Temp(Src) 98.1 F (36.7 C) (Oral)  Resp 16  Ht  (1.753 m)  Wt 147 lb 1.6 oz (66.724 kg)  BMI 21.71 kg/m2  SpO2 97% Physical Exam  Constitutional: He is oriented to person, place, and time. He appears well-developed and well-nourished.  HENT:  Head: Normocephalic and atraumatic.  Eyes: EOM are normal.  Neck: Normal range of motion.  Cardiovascular: Normal rate.   Pulmonary/Chest: Effort normal.  Musculoskeletal: Normal range of motion. He exhibits tenderness.  Right ankle tenderness to palpation to lateral aspect. Hardware appears to be in place. No crepitus. Normal ankle flexion, extension, eversion and inversion. Right hip with full ROM. No bruising  noted. Right shoulder with full ROM. No bruising or deformity noted.  Neurological: He is alert and oriented to person, place, and time.  Skin: Skin is warm and dry.  Psychiatric: He has a normal mood and affect. His behavior is normal.  Nursing note and vitals reviewed.   ED Course  Procedures (including critical care time) DIAGNOSTIC STUDIES: Oxygen Saturation is 97% on RA, normal by my interpretation.   COORDINATION OF CARE: 4:21 PM- suspect strain/sprain. Pt is NVI. Ambulate without difficulty.  Will prescribe muscle relaxer and give referral to orthopedist. Pt verbalizes understanding and agrees to plan.    MDM   Final diagnoses:  Right shoulder strain, initial encounter  Hip strain, right, initial encounter  Right ankle sprain, initial encounter    BP 131/89 mmHg  Pulse 64  Temp(Src) 98.1 F (36.7 C) (Oral)  Resp 16  Ht  (1.753 m)  Wt 66.724 kg  BMI 21.71 kg/m2  SpO2 97%  I personally performed the services described in this documentation, which was scribed in my presence. The recorded information has been reviewed and is accurate.       Fayrene Helper, PA-C 04/22/16 1628  Arby Barrette, MD 04/22/16 [phone]

## 2016-06-29 ENCOUNTER — Encounter (HOSPITAL_COMMUNITY): Payer: Self-pay

## 2016-06-29 ENCOUNTER — Emergency Department (HOSPITAL_COMMUNITY)
Admission: EM | Admit: 2016-06-29 | Discharge: 2016-06-29 | Disposition: A | Discharge status: Home / Self Care | Payer: Self-pay | Attending: Emergency Medicine | Admitting: Emergency Medicine

## 2016-06-29 DIAGNOSIS — G8929 Other chronic pain: Secondary | ICD-10-CM

## 2016-06-29 DIAGNOSIS — J45909 Unspecified asthma, uncomplicated: Secondary | ICD-10-CM | POA: Insufficient documentation

## 2016-06-29 DIAGNOSIS — Z202 Contact with and (suspected) exposure to infections with a predominantly sexual mode of transmission: Secondary | ICD-10-CM | POA: Insufficient documentation

## 2016-06-29 DIAGNOSIS — F17210 Nicotine dependence, cigarettes, uncomplicated: Secondary | ICD-10-CM | POA: Insufficient documentation

## 2016-06-29 DIAGNOSIS — Z79899 Other long term (current) drug therapy: Secondary | ICD-10-CM | POA: Insufficient documentation

## 2016-06-29 DIAGNOSIS — M549 Dorsalgia, unspecified: Secondary | ICD-10-CM

## 2016-06-29 DIAGNOSIS — I10 Essential (primary) hypertension: Secondary | ICD-10-CM | POA: Insufficient documentation

## 2016-06-29 DIAGNOSIS — M545 Low back pain: Secondary | ICD-10-CM | POA: Insufficient documentation

## 2016-06-29 MED ORDER — LIDOCAINE HCL (PF) 1 % IJ SOLN
2.0000 mL | Freq: Once | INTRAMUSCULAR | Status: AC
  Administered 2016-06-29: 2 mL

## 2016-06-29 MED ORDER — CEFTRIAXONE SODIUM 250 MG IJ SOLR
250.0000 mg | Freq: Once | INTRAMUSCULAR | Status: AC
  Administered 2016-06-29: 250 mg via INTRAMUSCULAR
  Filled 2016-06-29: qty 250

## 2016-06-29 MED ORDER — HYDROCODONE-ACETAMINOPHEN 5-325 MG PO TABS
1.0000 | ORAL_TABLET | Freq: Once | ORAL | Status: AC
  Administered 2016-06-29: 1 via ORAL
  Filled 2016-06-29: qty 1

## 2016-06-29 MED ORDER — KETOROLAC TROMETHAMINE 60 MG/2ML IM SOLN
60.0000 mg | Freq: Once | INTRAMUSCULAR | Status: AC
  Administered 2016-06-29: 60 mg via INTRAMUSCULAR
  Filled 2016-06-29: qty 2

## 2016-06-29 MED ORDER — TRAMADOL HCL 50 MG PO TABS: 50.0000 mg | ORAL_TABLET | Freq: Four times a day (QID) | ORAL | 0 refills | Status: DC | PRN

## 2016-06-29 MED ORDER — AZITHROMYCIN 250 MG PO TABS
1000.0000 mg | ORAL_TABLET | Freq: Once | ORAL | Status: AC
  Administered 2016-06-29: 1000 mg via ORAL
  Filled 2016-06-29: qty 4

## 2016-06-29 MED ORDER — PREDNISONE 50 MG PO TABS: 50.0000 mg | ORAL_TABLET | Freq: Every day | ORAL | 0 refills | Status: DC

## 2016-06-29 NOTE — Discharge Instructions (Signed)
Return here as needed.  Follow-up with your primary care doctor °

## 2016-06-29 NOTE — ED Provider Notes (Signed)
[facility]-EMERGENCY DEPT Provider Note   CSN: [id number] Arrival date & time: 06/29/16  8127  First Provider Contact:  None       History   Chief Complaint No chief complaint on file.   HPI Ricardo Brooks is a 60 y.o. male.  HPI Patient presents to the emergency department with back pain has been a chronic issue for the patient.  Patient states that always requesting today is a tablet of hydrocodone here in the emergency department.  He understands that he cannot be treated as an outpatient for chronic pain.  Patient states that he also would like to be treated for possible STD exposure.  Patient states that nothing seems make the condition better with movement and palpation make the pain worse. The patient denies chest pain, shortness of breath, headache,blurred vision, neck pain, fever, cough, weakness, numbness, dizziness, anorexia, edema, abdominal pain, nausea, vomiting, diarrhea, rash, dysuria, hematemesis, bloody stool, near syncope, or syncope. Past Medical History:  Diagnosis Date  . Asthma   . Bipolar 1 disorder (HCC)   . Hypertension     There are no active problems to display for this patient.   Past Surgical History:  Procedure Laterality Date  . orif right ankle Right 1985       Home Medications    Prior to Admission medications   Medication Sig Start Date End Date Taking? Authorizing Provider  acetaminophen (TYLENOL) 500 MG tablet Take 1,000 mg by mouth every 6 (six) hours as needed for mild pain.     Historical Provider, MD  ARIPiprazole (ABILIFY) 15 MG tablet Take 7.5 mg by mouth daily.    Historical Provider, MD  Cholecalciferol (VITAMIN D PO) Take 1 tablet by mouth daily.    Historical Provider, MD  Cyanocobalamin (VITAMIN B-12 PO) Take 1 tablet by mouth daily.    Historical Provider, MD  cyclobenzaprine (FLEXERIL) 10 MG tablet Take 1 tablet (10 mg total) by mouth 2 (two) times daily as needed for muscle spasms. 04/22/16   Fayrene Helper, PA-C  ibuprofen  (ADVIL,MOTRIN) 800 MG tablet Take 1 tablet (800 mg total) by mouth 3 (three) times daily. 04/22/16   Fayrene Helper, PA-C  loratadine (CLARITIN) 10 MG tablet Take 10 mg by mouth daily as needed for allergies.  09/09/13   [name], MD  meclizine (ANTIVERT) 25 MG tablet Take 1 tablet (25 mg total) by mouth 3 (three) times daily as needed for dizziness. 08/13/14   [name], MD  naproxen (NAPROSYN) 500 MG tablet Take 1 tablet (500 mg total) by mouth 2 (two) times daily. 01/23/16   Tinnie Gens Hedges, PA-C  ondansetron (ZOFRAN) 4 MG tablet Take 1 tablet (4 mg total) by mouth every 6 (six) hours. 07/09/14   [name], PA-C  traMADol (ULTRAM) 50 MG tablet Take 1 tablet (50 mg total) by mouth every 6 (six) hours as needed. 07/06/14   [name], PA-C    Family History History reviewed. No pertinent family history.  Social History Social History  Substance Use Topics  . Smoking status: Current Every Day Smoker    Packs/day: 0.50    Years: 18.00    Types: Cigarettes  . Smokeless tobacco: Never Used  . Alcohol use Yes     Comment: 2 drinks weekly      Allergies   Methocarbamol   Review of Systems Review of Systems  All other systems negative except as documented in the HPI. All pertinent positives and negatives as reviewed in the HPI. Physical Exam Updated  Vital Signs BP 164/87 (BP Location: Right Arm)   Pulse (!) 52   Temp 98 F (36.7 C) (Oral)   Resp 18   Ht  (1.778 m)   Wt 69.9 kg   SpO2 99%   BMI 22.10 kg/m   Physical Exam  Constitutional: He is oriented to person, place, and time. He appears well-developed and well-nourished. No distress.  HENT:  Head: Normocephalic and atraumatic.  Mouth/Throat: Oropharynx is clear and moist.  Eyes: Pupils are equal, round, and reactive to light.  Neck: Normal range of motion. Neck supple.  Cardiovascular: Normal rate, regular rhythm and normal heart sounds.  Exam reveals no gallop and no friction rub.   No murmur  heard. Pulmonary/Chest: Effort normal and breath sounds normal. No respiratory distress. He has no wheezes.  Abdominal: Soft. Bowel sounds are normal. He exhibits no distension. There is no tenderness.  Musculoskeletal:       Lumbar back: He exhibits tenderness and pain. He exhibits normal range of motion, no bony tenderness, no swelling, no laceration, no spasm and normal pulse.       Back:  Neurological: He is alert and oriented to person, place, and time. He exhibits normal muscle tone. Coordination normal.  Skin: Skin is warm and dry. No rash noted. No erythema.  Psychiatric: He has a normal mood and affect. His behavior is normal.  Nursing note and vitals reviewed.    ED Treatments / Results  Labs (all labs ordered are listed, but only abnormal results are displayed) Labs Reviewed - No data to display  EKG  EKG Interpretation None       Radiology No results found.  Procedures Procedures (including critical care time)  Medications Ordered in ED Medications  HYDROcodone-acetaminophen (NORCO/VICODIN) 5-325 MG per tablet 1 tablet (1 tablet Oral Given 06/29/16 1121)  ketorolac (TORADOL) injection 60 mg (60 mg Intramuscular Given 06/29/16 1123)  cefTRIAXone (ROCEPHIN) injection 250 mg (250 mg Intramuscular Given 06/29/16 1124)  azithromycin (ZITHROMAX) tablet 1,000 mg (1,000 mg Oral Given 06/29/16 1122)  lidocaine (PF) (XYLOCAINE) 1 % injection 2 mL (2 mLs Other Given 06/29/16 1123)     Initial Impression / Assessment and Plan / ED Course  I have reviewed the triage vital signs and the nursing notes.  Pertinent labs & imaging results that were available during my care of the patient were reviewed by me and considered in my medical decision making (see chart for details).  Clinical Course   Patient be treated for his chronic lumbar back pain.  Told to return here as needed.  Advised follow-up with his primary care doctor.  Patient agrees the plan and all questions were  answered  Final Clinical Impressions(s) / ED Diagnoses   Final diagnoses:  None    New Prescriptions New Prescriptions   No medications on file     Charlestine Night, PA-C 06/30/16 1701    Pricilla Loveless, MD 07/01/16 [phone]

## 2016-06-29 NOTE — ED Triage Notes (Signed)
Pt presents with R sided low back pain since injury in 1996.  Pt reports he never received treatment for injury due to incarceration.  Pt reports tingling in both legs that has worsened, denies any bowel or bladder incontinence.  Pt reports taking ibuprofen without relief.

## 2016-07-06 ENCOUNTER — Emergency Department (HOSPITAL_COMMUNITY)
Admission: EM | Admit: 2016-07-06 | Discharge: 2016-07-06 | Disposition: A | Discharge status: Home / Self Care | Payer: Self-pay | Attending: Emergency Medicine | Admitting: Emergency Medicine

## 2016-07-06 ENCOUNTER — Encounter (HOSPITAL_COMMUNITY): Payer: Self-pay | Admitting: *Deleted

## 2016-07-06 DIAGNOSIS — F17210 Nicotine dependence, cigarettes, uncomplicated: Secondary | ICD-10-CM | POA: Insufficient documentation

## 2016-07-06 DIAGNOSIS — M25561 Pain in right knee: Secondary | ICD-10-CM | POA: Insufficient documentation

## 2016-07-06 DIAGNOSIS — G8929 Other chronic pain: Secondary | ICD-10-CM

## 2016-07-06 DIAGNOSIS — M545 Low back pain: Secondary | ICD-10-CM | POA: Insufficient documentation

## 2016-07-06 DIAGNOSIS — J45909 Unspecified asthma, uncomplicated: Secondary | ICD-10-CM | POA: Insufficient documentation

## 2016-07-06 DIAGNOSIS — I10 Essential (primary) hypertension: Secondary | ICD-10-CM | POA: Insufficient documentation

## 2016-07-06 DIAGNOSIS — M25562 Pain in left knee: Secondary | ICD-10-CM | POA: Insufficient documentation

## 2016-07-06 MED ORDER — HYDROCODONE-ACETAMINOPHEN 5-325 MG PO TABS
1.0000 | ORAL_TABLET | Freq: Once | ORAL | Status: AC
  Administered 2016-07-06: 1 via ORAL
  Filled 2016-07-06: qty 1

## 2016-07-06 NOTE — Discharge Instructions (Signed)
Take home pain medications as prescribed. Continue follow-up with your PCP and pain management clinic. Return to the emergency department if you experience severe worsening of your symptoms, loss of control of your bowels or bladder, fevers, numbness or tingling in both of your lower extremities.

## 2016-07-06 NOTE — ED Notes (Signed)
Patient able to ambulate independently  

## 2016-07-06 NOTE — ED Provider Notes (Signed)
[facility]-EMERG[facility]Y DEPT Provider Note   CSN: [id number] Arrival date & time: 07/06/16  1515  First Provider Contact:  First MD Initiated Contact with Patient 07/06/16 1730   By signing my name below, I, Bridgette Habermann, attest that this documentation has been prepared under the direction and in the presence of Ellasyn Swilling, PA-C. Electronically Signed: Bridgette Habermann, ED Scribe. 07/06/16. 5:48 PM  History   Chief Complaint Chief Complaint  Patient presents with  . Hip Pain  . Knee Pain   HPI Comments: Ricardo Brooks is a 60 y.o. male with h/o HTN and chronic back pain who presents to the Emergency Department complaining of constant, 8/10 back pain s/p mechanical injury in 1996 that worsened a couple months ago. Pt has had gradually worsening chronic back pain since his injury. Pt also has associated aching, bilateral knee and hip pain. He is ambulatory. Pt was seen here on 06/29/2016 for the same symptoms and was given Tramadol for the pain with moderate relief but notes that it makes him "out of it". Pt states he went to see his PCP who told him he was going to put him in a pain management clinic but pt is currently waiting for his insurance to go through. Pt has no other complaints at this time. He denies fever, chills, urinary and bowel incontinence.   The history is provided by the patient. No language interpreter was used.    Past Medical History:  Diagnosis Date  . Asthma   . Bipolar 1 disorder (HCC)   . Hypertension     There are no active problems to display for this patient.   Past Surgical History:  Procedure Laterality Date  . orif right ankle Right 1985       Home Medications    Prior to Admission medications   Medication Sig Start Date End Date Taking? Authorizing Provider  acetaminophen (TYLENOL) 500 MG tablet Take 1,000 mg by mouth every 6 (six) hours as needed for mild pain.     Historical Provider, MD  ARIPiprazole (ABILIFY) 15 MG tablet Take 7.5 mg by mouth daily.     Historical Provider, MD  Cholecalciferol (VITAMIN D PO) Take 1 tablet by mouth daily.    Historical Provider, MD  Cyanocobalamin (VITAMIN B-12 PO) Take 1 tablet by mouth daily.    Historical Provider, MD  cyclobenzaprine (FLEXERIL) 10 MG tablet Take 1 tablet (10 mg total) by mouth 2 (two) times daily as needed for muscle spasms. 04/22/16   Fayrene Helper, PA-C  ibuprofen (ADVIL,MOTRIN) 800 MG tablet Take 1 tablet (800 mg total) by mouth 3 (three) times daily. 04/22/16   Fayrene Helper, PA-C  loratadine (CLARITIN) 10 MG tablet Take 10 mg by mouth daily as needed for allergies.  09/09/13   [name], MD  meclizine (ANTIVERT) 25 MG tablet Take 1 tablet (25 mg total) by mouth 3 (three) times daily as needed for dizziness. 08/13/14   [name], MD  naproxen (NAPROSYN) 500 MG tablet Take 1 tablet (500 mg total) by mouth 2 (two) times daily. 01/23/16   Tinnie Gens Hedges, PA-C  ondansetron (ZOFRAN) 4 MG tablet Take 1 tablet (4 mg total) by mouth every 6 (six) hours. 07/09/14   Tiffany Neva Seat, PA-C  predniSONE (DELTASONE) 50 MG tablet Take 1 tablet (50 mg total) by mouth daily. 06/29/16   Charlestine Night, PA-C  traMADol (ULTRAM) 50 MG tablet Take 1 tablet (50 mg total) by mouth every 6 (six) hours as needed. 07/06/14   [name], PA-C  traMADol (ULTRAM) 50 MG tablet Take 1 tablet (50 mg total) by mouth every 6 (six) hours as needed for severe pain. 06/29/16   Charlestine Night, PA-C    Family History History reviewed. No pertinent family history.  Social History Social History  Substance Use Topics  . Smoking status: Current Every Day Smoker    Packs/day: 0.50    Years: 18.00    Types: Cigarettes  . Smokeless tobacco: Never Used  . Alcohol use Yes     Comment: 2 drinks weekly      Allergies   Methocarbamol   Review of Systems Review of Systems  Constitutional: Negative for chills and fever.  Genitourinary: Negative for urgency.  Musculoskeletal: Positive for arthralgias and back pain.  All other  systems reviewed and are negative.    Physical Exam Updated Vital Signs BP 141/75 (BP Location: Right Arm)   Pulse 60   Temp 98.5 F (36.9 C) (Oral)   Resp 18   SpO2 99%   Physical Exam  Constitutional: He is oriented to person, place, and time. He appears well-developed and well-nourished. No distress.  HENT:  Head: Normocephalic and atraumatic.  Eyes: Conjunctivae are normal. Right eye exhibits no discharge. Left eye exhibits no discharge. No scleral icterus.  Cardiovascular: Normal rate.   Pulmonary/Chest: Effort normal.  Musculoskeletal: Normal range of motion. He exhibits tenderness. He exhibits no deformity.  Midline lumbar spinal tenderness. Full ROM of C, T, and L spine. No step-offs or obvious bony deformity. Negative anterior/poster drawer bilaterally. Negative ballottement test. No varus or valgus laxity. No crepitus. No pain with flexion or extension. No TTP of knees or ankles.   Neurological: He is alert and oriented to person, place, and time. Coordination normal.  Strength 5/5 throughout. No sensory deficits. No gait abnormality.  Skin: Skin is warm and dry. No rash noted. He is not diaphoretic. No erythema. No pallor.  Psychiatric: He has a normal mood and affect. His behavior is normal.  Nursing note and vitals reviewed.  ED Treatments / Results  DIAGNOSTIC STUDIES: Oxygen Saturation is 99% on RA, normal by my interpretation.    COORDINATION OF CARE: 5:37 PM Discussed treatment plan with pt at bedside which includes dose of pain medication and pt agreed to plan.  Labs (all labs ordered are listed, but only abnormal results are displayed) Labs Reviewed - No data to display  EKG  EKG Interpretation None       Radiology No results found.  Procedures Procedures (including critical care time)  Medications Ordered in ED Medications - No data to display   Initial Impression / Assessment and Plan / ED Course  I have reviewed the triage vital signs and  the nursing notes.  Pertinent labs & imaging results that were available during my care of the patient were reviewed by me and considered in my medical decision making (see chart for details).  Clinical Course    Pt presents to the ED with exacerbation of chronic back and knee pain. Chronic pain is well documented in pt chart. Pt has no neurological deficits on exam. He is currently in the process of getting to see a pain management clinic and his pain is currently being managed by his PCP. Will give one dose of pain medication in ED. Discussed that I cannot give pt pain medication prescription at this time. Pt expresses understanding. He will follow up with PCP.   Final Clinical Impressions(s) / ED Diagnoses   Final diagnoses:  Chronic pain  New Prescriptions New Prescriptions   No medications on file  I personally performed the services described in this documentation, which was scribed in my presence. The recorded information has been reviewed and is accurate.      Lester Kinsman [place], PA-C 07/08/16 1358    Arby Barrette, MD 07/09/16 [phone]

## 2016-07-06 NOTE — ED Triage Notes (Signed)
Pt reports chronic back pain but now also having bilateral knee and hip pain. Ambulatory at triage.

## 2017-02-05 ENCOUNTER — Emergency Department (HOSPITAL_COMMUNITY): Payer: Self-pay

## 2017-02-05 ENCOUNTER — Emergency Department (HOSPITAL_COMMUNITY)
Admission: EM | Admit: 2017-02-05 | Discharge: 2017-02-05 | Disposition: A | Discharge status: Home / Self Care | Payer: Self-pay | Attending: Emergency Medicine | Admitting: Emergency Medicine

## 2017-02-05 ENCOUNTER — Encounter (HOSPITAL_COMMUNITY): Payer: Self-pay | Admitting: Emergency Medicine

## 2017-02-05 DIAGNOSIS — I10 Essential (primary) hypertension: Secondary | ICD-10-CM | POA: Insufficient documentation

## 2017-02-05 DIAGNOSIS — J45909 Unspecified asthma, uncomplicated: Secondary | ICD-10-CM | POA: Insufficient documentation

## 2017-02-05 DIAGNOSIS — F17210 Nicotine dependence, cigarettes, uncomplicated: Secondary | ICD-10-CM | POA: Insufficient documentation

## 2017-02-05 DIAGNOSIS — G8929 Other chronic pain: Secondary | ICD-10-CM | POA: Insufficient documentation

## 2017-02-05 DIAGNOSIS — M545 Low back pain, unspecified: Secondary | ICD-10-CM

## 2017-02-05 MED ORDER — HYDROCODONE-ACETAMINOPHEN 5-325 MG PO TABS
1.0000 | ORAL_TABLET | ORAL | Status: AC
  Administered 2017-02-05: 1 via ORAL
  Filled 2017-02-05: qty 1

## 2017-02-05 NOTE — ED Triage Notes (Signed)
Pt sts chronic back pain with some instances of bowel incontinence over last several weeks

## 2017-02-05 NOTE — ED Notes (Signed)
Pt is in MRI.  Pt was alert and oriented

## 2017-02-05 NOTE — ED Notes (Addendum)
Pt c/o chronic back pain reporting that 2-3 nights ago he defecated on himself. Denies loss of bowel or bladder since. He reports he is in constant pain in the right middle to lower back. Pt denies taking anything for pain today. Denies recent trauma. He reports his legs have "been giving out more and more".

## 2017-02-05 NOTE — Discharge Instructions (Signed)
Continue your current medications. Follow-up with your primary care doctor. The MRI did not show any signs of spinal cord or nerve impingement that would cause the episode of incontinence that you had.  No need for any spinal surgery

## 2017-02-05 NOTE — ED Provider Notes (Signed)
[facility]EMERGENCY DEPT Provider Note   CSN: [id number] Arrival date & time: 02/05/17  1147     History   Chief Complaint Chief Complaint  Patient presents with  . Back Pain    HPI Daylen Lipsky is a 61 y.o. male.  HPI Patient presents to the emergency room with complaints of worsening lower back pain. Patient states he has history of chronic back pain. He takes Ultram for the pain. The pain is primarily in the right lower back and will radiate to both legs into his thighs. Patient feels like his legs have been giving out on him more recently. He also had an episode of fecal incontinence the other night. He was told in the past to look out for the symptoms so he came in to the emergency room today for evaluation. He denies any fevers or chills. No vomiting or diarrhea. Past Medical History:  Diagnosis Date  . Asthma   . Bipolar 1 disorder (HCC)   . Chronic back pain   . Hypertension     There are no active problems to display for this patient.   Past Surgical History:  Procedure Laterality Date  . orif right ankle Right 1985       Home Medications    Prior to Admission medications   Medication Sig Start Date End Date Taking? Authorizing Provider  acetaminophen (TYLENOL) 500 MG tablet Take 1,000 mg by mouth every 6 (six) hours as needed for mild pain.     Historical Provider, MD  ARIPiprazole (ABILIFY) 15 MG tablet Take 7.5 mg by mouth daily.    Historical Provider, MD  Cholecalciferol (VITAMIN D PO) Take 1 tablet by mouth daily.    Historical Provider, MD  Cyanocobalamin (VITAMIN B-12 PO) Take 1 tablet by mouth daily.    Historical Provider, MD  cyclobenzaprine (FLEXERIL) 10 MG tablet Take 1 tablet (10 mg total) by mouth 2 (two) times daily as needed for muscle spasms. 04/22/16   Fayrene Helper, PA-C  ibuprofen (ADVIL,MOTRIN) 800 MG tablet Take 1 tablet (800 mg total) by mouth 3 (three) times daily. 04/22/16   Fayrene Helper, PA-C  loratadine (CLARITIN) 10 MG tablet Take 10 mg by  mouth daily as needed for allergies.  09/09/13   [name], MD  meclizine (ANTIVERT) 25 MG tablet Take 1 tablet (25 mg total) by mouth 3 (three) times daily as needed for dizziness. 08/13/14   [name], MD  naproxen (NAPROSYN) 500 MG tablet Take 1 tablet (500 mg total) by mouth 2 (two) times daily. 01/23/16   Tinnie Gens Hedges, PA-C  ondansetron (ZOFRAN) 4 MG tablet Take 1 tablet (4 mg total) by mouth every 6 (six) hours. 07/09/14   Tiffany Neva Seat, PA-C  predniSONE (DELTASONE) 50 MG tablet Take 1 tablet (50 mg total) by mouth daily. 06/29/16   Charlestine Night, PA-C  traMADol (ULTRAM) 50 MG tablet Take 1 tablet (50 mg total) by mouth every 6 (six) hours as needed. 07/06/14   [name], PA-C  traMADol (ULTRAM) 50 MG tablet Take 1 tablet (50 mg total) by mouth every 6 (six) hours as needed for severe pain. 06/29/16   Charlestine Night, PA-C    Family History History reviewed. No pertinent family history.  Social History Social History  Substance Use Topics  . Smoking status: Current Every Day Smoker    Packs/day: 0.50    Years: 18.00    Types: Cigarettes  . Smokeless tobacco: Never Used  . Alcohol use Yes     Comment: 2  drinks weekly      Allergies   Methocarbamol   Review of Systems Review of Systems  All other systems reviewed and are negative.    Physical Exam Updated Vital Signs BP 165/91   Pulse 62   Temp 97.9 F (36.6 C) (Oral)   Resp 18   Ht 5' 9.5" (1.765 m)   Wt 66.7 kg   SpO2 99%   BMI 21.40 kg/m   Physical Exam  Constitutional: He appears well-developed and well-nourished. No distress.  HENT:  Head: Normocephalic and atraumatic.  Right Ear: External ear normal.  Left Ear: External ear normal.  Eyes: Conjunctivae are normal. Right eye exhibits no discharge. Left eye exhibits no discharge. No scleral icterus.  Neck: Neck supple. No tracheal deviation present.  Cardiovascular: Normal rate.   Pulmonary/Chest: Effort normal. No stridor. No respiratory  distress.  Abdominal: He exhibits no distension.  Musculoskeletal: He exhibits no edema.       Lumbar back: He exhibits tenderness. He exhibits no swelling.  Neurological: He is alert. Cranial nerve deficit: no gross deficits.  Reflex Scores:      Patellar reflexes are 3+ on the right side and 3+ on the left side.      Achilles reflexes are 3+ on the right side and 3+ on the left side. 5/5 plantar and dorsiflexion bilaterally, sensation intact  Skin: Skin is warm and dry. No rash noted.  Psychiatric: He has a normal mood and affect.  Nursing note and vitals reviewed.    ED Treatments / Results  Labs (all labs ordered are listed, but only abnormal results are displayed) Labs Reviewed - No data to display  EKG  EKG Interpretation None       Radiology Mr Lumbar Spine Wo Contrast  Result Date: 02/05/2017 CLINICAL DATA:  Chronic low back pain.  Leg weakness. EXAM: MRI LUMBAR SPINE WITHOUT CONTRAST TECHNIQUE: Multiplanar, multisequence MR imaging of the lumbar spine was performed. No intravenous contrast was administered. COMPARISON:  Radiographs dated 02/26/2013 FINDINGS: Segmentation:  Standard. Alignment:  Physiologic. Vertebrae:  No fracture, evidence of discitis, or bone lesion. Conus medullaris: Extends to the L1-2 level and appears normal. Paraspinal and other soft tissues: Negative. Disc levels: T11-12 through L3-4:  Normal. L4-5: Normal disc. Mild to moderate bilateral facet arthritis. No neural impingement. No foraminal stenosis. L5-S1: Disc desiccation with a small broad-based disc bulge asymmetric to the left with an annular fissure centrally and to the left. However, there is no impingement upon the nerve roots. Widely patent neural foramina. IMPRESSION: 1. Degenerative disc disease at L5-S1 with a small broad-based disc bulge asymmetric to the left without neural impingement. 2. Mild to moderate bilateral facet arthritis at L4-5. Electronically Signed   By: Francene Boyers M.D.    On: 02/05/2017 16:24    Procedures Procedures (including critical care time)  Medications Ordered in ED Medications  HYDROcodone-acetaminophen (NORCO/VICODIN) 5-325 MG per tablet 1 tablet (not administered)     Initial Impression / Assessment and Plan / ED Course  I have reviewed the triage vital signs and the nursing notes.  Pertinent labs & imaging results that were available during my care of the patient were reviewed by me and considered in my medical decision making (see chart for details).  Clinical Course as of Feb 05 1653  Mon Feb 05, 2017  1444 Somewhat hyper reflexive but no gross deficits.  With his complaints of leg weakness and incontinence, will get an MRI of the lumbar spine to rule  out cauda equina/spinal cord injury, compression  [JK]    Clinical Course User Index [JK] Linwood DibblesJon Waldon Sheerin, MD    MRI without cauda equina or neural impingement.   Stable for discharge.  Follow up with PCP if he has any recurrent episodes of incontinence.  Would consider a gi work then  Final Clinical Impressions(s) / ED Diagnoses   Final diagnoses:  Low back pain    New Prescriptions New Prescriptions   No medications on file     Linwood DibblesJon Kendalyn Cranfield, MD 02/05/17 1655

## 2017-03-03 ENCOUNTER — Emergency Department (HOSPITAL_COMMUNITY)
Admission: EM | Admit: 2017-03-03 | Discharge: 2017-03-03 | Disposition: A | Discharge status: Home / Self Care | Payer: Self-pay | Attending: Emergency Medicine | Admitting: Emergency Medicine

## 2017-03-03 ENCOUNTER — Encounter (HOSPITAL_COMMUNITY): Payer: Self-pay | Admitting: *Deleted

## 2017-03-03 DIAGNOSIS — I10 Essential (primary) hypertension: Secondary | ICD-10-CM | POA: Insufficient documentation

## 2017-03-03 DIAGNOSIS — F17210 Nicotine dependence, cigarettes, uncomplicated: Secondary | ICD-10-CM | POA: Insufficient documentation

## 2017-03-03 DIAGNOSIS — Z79899 Other long term (current) drug therapy: Secondary | ICD-10-CM | POA: Insufficient documentation

## 2017-03-03 DIAGNOSIS — G8929 Other chronic pain: Secondary | ICD-10-CM

## 2017-03-03 DIAGNOSIS — J0100 Acute maxillary sinusitis, unspecified: Secondary | ICD-10-CM | POA: Insufficient documentation

## 2017-03-03 DIAGNOSIS — M545 Low back pain: Secondary | ICD-10-CM | POA: Insufficient documentation

## 2017-03-03 DIAGNOSIS — J320 Chronic maxillary sinusitis: Secondary | ICD-10-CM

## 2017-03-03 DIAGNOSIS — J45909 Unspecified asthma, uncomplicated: Secondary | ICD-10-CM | POA: Insufficient documentation

## 2017-03-03 MED ORDER — FLUTICASONE PROPIONATE 50 MCG/ACT NA SUSP: 2.0000 | Freq: Every day | NASAL | 2 refills | Status: DC

## 2017-03-03 MED ORDER — AZITHROMYCIN 250 MG PO TABS: 250.0000 mg | ORAL_TABLET | Freq: Every day | ORAL | 0 refills | Status: DC

## 2017-03-03 MED ORDER — HYDROCODONE-ACETAMINOPHEN 5-325 MG PO TABS
1.0000 | ORAL_TABLET | Freq: Once | ORAL | Status: AC
  Administered 2017-03-03: 1 via ORAL
  Filled 2017-03-03: qty 1

## 2017-03-03 NOTE — ED Triage Notes (Signed)
Pt reports chronic back, neck and joint pain. Also having recent sinus headaches. No relief with tramadol. Ambulatory at triage.

## 2017-03-03 NOTE — Discharge Instructions (Signed)
Read the information below.  You are being treated for possible sinus infection given your physical exam findings. I have prescribed antibiotics as well as Flonase. Warm compresses to affected sinuses, nasal saline washes, and steam showers for symptomatic relief.  Regarding your back pain - Tylenol or Motrin per package instructions for pain relief. You can use your tramadol or hydrocodone as prescribed by your provider for severe pain. Heat or ice to affected area. Gentle stretching. Follow up with primary provider for further evaluation and management. If pain is persistent you may need to see a pain specialist for better control.  Use the prescribed medication as directed.  Please discuss all new medications with your pharmacist.   You may return to the Emergency Department at any time for worsening condition or any new symptoms that concern you. Return if develop severe headache, visual changes, pain in chest, difficulty breathing, neck stiffness, numbness, weakness, loss of bowel or bladder function, uncontrolled pain, or any other new/concerning symptoms.

## 2017-03-03 NOTE — ED Provider Notes (Signed)
[facility] Provider Note    By signing my name below, I, Ricardo Brooks, attest that this documentation has been prepared under the direction and in the presence of [name], PA-C. Electronically Signed: Earmon Brooks, ED Scribe. 03/03/17. 11:49 AM.    History   Chief Complaint Chief Complaint  Patient presents with  . Back Pain    The history is provided by the patient and medical records. No language interpreter was used.    Ricardo Brooks is a 61 y.o. male with PMHx of asthma, Bipolar disorder, HTN and chronic back pain who presents to the Emergency Department complaining of chronic sinus congestion that has been ongoing for quite some time. He reports associated sinus HA and maxillary sinus pressure that began one day ago. He has been treated with medication in the past for this. Has had sinus congestion and mild PND. He has been using nasal sprays and a previously prescribed medication as directed (unsure of the name) with mild relief. He denies modifying factors. He denies fever, chills, dental pain, rhinorrhea, visual changes, chest pain, shortness of breath, cough, nausea, vomiting, diarrhea, abdominal pain, LOC, facial droop, slurred speech.  Pt also reports dull, aching and intermittently sharp low back pain similar to his chronic back pain. He reports chronic b/l ankle and knee pain as well. He reports taking Vicodin, Tramadol and an unspecified medication (no longer takes due to stomach upset). He denies modifying factors. He denies saddle anesthesia, bowel or bladder incontinence, numbness, tingling or weakness of any extremities, trauma, rash, dysuria, hematuria, injury or fall, h/o cancer, h/o IVDU. His PCP is [name] and he also is treated at the [facility] in [place].   Past Medical History:  Diagnosis Date  . Asthma   . Bipolar 1 disorder (HCC)   . Chronic back pain   . Hypertension     There are no active problems to display for this  patient.   Past Surgical History:  Procedure Laterality Date  . orif right ankle Right 1985       Home Medications    Prior to Admission medications   Medication Sig Start Date End Date Taking? Authorizing Provider  acetaminophen (TYLENOL) 500 MG tablet Take 1,000 mg by mouth every 6 (six) hours as needed for mild pain.     Historical Provider, MD  ARIPiprazole (ABILIFY) 15 MG tablet Take 7.5 mg by mouth daily.    Historical Provider, MD  Cholecalciferol (VITAMIN D PO) Take 1 tablet by mouth daily.    Historical Provider, MD  Cyanocobalamin (VITAMIN B-12 PO) Take 1 tablet by mouth daily.    Historical Provider, MD  cyclobenzaprine (FLEXERIL) 10 MG tablet Take 1 tablet (10 mg total) by mouth 2 (two) times daily as needed for muscle spasms. 04/22/16   Fayrene Helper, PA-C  ibuprofen (ADVIL,MOTRIN) 800 MG tablet Take 1 tablet (800 mg total) by mouth 3 (three) times daily. 04/22/16   Fayrene Helper, PA-C  loratadine (CLARITIN) 10 MG tablet Take 10 mg by mouth daily as needed for allergies.  09/09/13   [name], MD  meclizine (ANTIVERT) 25 MG tablet Take 1 tablet (25 mg total) by mouth 3 (three) times daily as needed for dizziness. 08/13/14   [name], MD  naproxen (NAPROSYN) 500 MG tablet Take 1 tablet (500 mg total) by mouth 2 (two) times daily. 01/23/16   Tinnie Gens Hedges, PA-C  ondansetron (ZOFRAN) 4 MG tablet Take 1 tablet (4 mg total) by mouth every 6 (six) hours. 07/09/14  [name], PA-C  predniSONE (DELTASONE) 50 MG tablet Take 1 tablet (50 mg total) by mouth daily. 06/29/16   Charlestine Night, PA-C  traMADol (ULTRAM) 50 MG tablet Take 1 tablet (50 mg total) by mouth every 6 (six) hours as needed. 07/06/14   [name], PA-C  traMADol (ULTRAM) 50 MG tablet Take 1 tablet (50 mg total) by mouth every 6 (six) hours as needed for severe pain. 06/29/16   Charlestine Night, PA-C    Family History History reviewed. No pertinent family history.  Social History Social History  Substance Use  Topics  . Smoking status: Current Every Day Smoker    Packs/day: 0.50    Years: 18.00    Types: Cigarettes  . Smokeless tobacco: Never Used  . Alcohol use Yes     Comment: 2 drinks weekly      Allergies   Methocarbamol   Review of Systems Review of Systems  Constitutional: Negative for chills and fever.  HENT: Positive for congestion, postnasal drip and sinus pain. Negative for dental problem and rhinorrhea.   Eyes: Negative for visual disturbance.  Respiratory: Negative for cough and shortness of breath.   Cardiovascular: Negative for chest pain.  Gastrointestinal: Negative for abdominal pain, diarrhea, nausea and vomiting.       No bowel or bladder incontinence  Genitourinary: Negative for dysuria and hematuria.  Musculoskeletal: Positive for back pain. Negative for neck pain.  Skin: Negative for rash.  Neurological: Positive for headaches ( sinus in nature). Negative for syncope, facial asymmetry, speech difficulty, weakness and numbness.     Physical Exam Updated Vital Signs BP 140/87 (BP Location: Left Arm)   Pulse 60   Temp 98.2 F (36.8 C) (Oral)   Resp 18   SpO2 100%   Physical Exam  Constitutional: He is oriented to person, place, and time. He appears well-developed and well-nourished. No distress.  HENT:  Head: Normocephalic and atraumatic.  Right Ear: External ear normal.  Left Ear: External ear normal.  Nose: Right sinus exhibits maxillary sinus tenderness. Right sinus exhibits no frontal sinus tenderness. Left sinus exhibits no maxillary sinus tenderness and no frontal sinus tenderness.  Mouth/Throat: Uvula is midline, oropharynx is clear and moist and mucous membranes are normal. No trismus in the jaw. No uvula swelling. No oropharyngeal exudate.  Eyes: Conjunctivae and EOM are normal. Pupils are equal, round, and reactive to light. Right eye exhibits no discharge. Left eye exhibits no discharge. No scleral icterus.  Neck: Normal range of motion. Neck  supple. No neck rigidity. Normal range of motion present.  Cardiovascular: Normal rate, regular rhythm, normal heart sounds and intact distal pulses.  Exam reveals no gallop and no friction rub.   No murmur heard. Pulmonary/Chest: Effort normal and breath sounds normal. No respiratory distress. He has no wheezes. He has no rales.  Abdominal: Soft. There is no tenderness.  Musculoskeletal: Normal range of motion.  No midline spinal tenderness. Mild tenderness to palpation to his right paravertebral lumbar muscles. No rashes, warmth or redness.  Lymphadenopathy:    He has no cervical adenopathy.  Neurological: He is alert and oriented to person, place, and time. No cranial nerve deficit.  Mental Status:  Alert, thought content appropriate, able to give a coherent history. Speech fluent without evidence of aphasia. Able to follow 2 step commands without difficulty.  Cranial Nerves:  II:  Peripheral visual fields grossly normal, pupils equal, round, reactive to light III,IV, VI: ptosis not present, extra-ocular motions intact bilaterally  V,VII:  smile symmetric, facial light touch sensation equal VIII: hearing grossly normal to voice  X: uvula elevates symmetrically  XI: bilateral shoulder shrug symmetric and strong XII: midline tongue extension without fassiculations Motor:  Normal tone. 5/5 in upper and lower extremities bilaterally including strong and equal grip strength and dorsiflexion/plantar flexion Sensory: light touch normal in all extremities. DTRs: biceps 2+ symmetric b/l Gait: normal gait and balance CV: distal pulses palpable throughout   Skin: Skin is warm and dry. He is not diaphoretic.  Psychiatric: He has a normal mood and affect. His behavior is normal.  Nursing note and vitals reviewed.    ED Treatments / Results  DIAGNOSTIC STUDIES: Oxygen Saturation is 100% on RA, normal by my interpretation.   COORDINATION OF CARE: 11:44 AM- Informed pt that narcotic would not  be prescribed but a dose will be given prior to discharge. Offered muscle relaxer but pt declined. Will prescribe NSAID and antibiotic for sinus infection. Pt verbalizes understanding and agrees to plan.  Medications  HYDROcodone-acetaminophen (NORCO/VICODIN) 5-325 MG per tablet 1 tablet (not administered)    Labs (all labs ordered are listed, but only abnormal results are displayed) Labs Reviewed - No data to display  EKG  EKG Interpretation None       Radiology No results found.  Procedures Procedures (including critical care time)  Medications Ordered in ED Medications  HYDROcodone-acetaminophen (NORCO/VICODIN) 5-325 MG per tablet 1 tablet (not administered)     Initial Impression / Assessment and Plan / ED Course  I have reviewed the triage vital signs and the nursing notes.  Pertinent labs & imaging results that were available during my care of the patient were reviewed by me and considered in my medical decision making (see chart for details).     Patient presents to ED with complaint of sinus congestion and acute on chronic low back pain. Patient is afebrile and non-toxic appearing in NAD. VSS. TTP of right maxillary sinus. No posterior oropharynx erythema. Nml appearing TMs. Heart RRR. Lungs CTABL. Abdomen soft, non-tender. Neck ROM intact. Given h/o chronic sinus issues and TTP of maxillary sinus will treat for possible sinusitis. ABX and nasal spray. Symptomatic treatment further discussed. Follow up with PCP.   Patient with back pain. Acute on chronic in nature. Right low back, non radiation. Constant with intermittent sharp pains. Denies dysuria, hematuria. No midline spinal tenderness. TTP of right lumbar paravertebral muscles. No neurological deficits and normal neuro exam.  Patient can walk but states is painful.  No loss of bowel or bladder control.  Low suspicion for cauda equina.  No fever, h/o cancer, IVDU. Patient seen in early march for back pain; MRI at the  time showed degenerative disc disease at L5-S1 and disc bulge without neural impingement. Discussed with patient no narcotic Rx, will give dose before leave. RICE protocol and pain medicine indicated and discussed with patient.   Follow up with PCP for further evaluation, may need pain clinic for chronic pain. Return precautions given. Patient voiced understanding and is agreeable.    I personally performed the services described in this documentation, which was scribed in my presence. The recorded information has been reviewed and is accurate.   Final Clinical Impressions(s) / ED Diagnoses   Final diagnoses:  Maxillary sinusitis, unspecified chronicity  Chronic right-sided low back pain, with sciatica presence unspecified    New Prescriptions New Prescriptions   No medications on file     Deborha Payment, PA-C 03/03/17 1220    Ivin Booty  Carollee Massed, MD 03/04/17 [id number]

## 2017-04-01 ENCOUNTER — Encounter (HOSPITAL_COMMUNITY): Payer: Self-pay | Admitting: Vascular Surgery

## 2017-04-01 ENCOUNTER — Emergency Department (HOSPITAL_COMMUNITY): Payer: Medicaid Other

## 2017-04-01 ENCOUNTER — Emergency Department (HOSPITAL_COMMUNITY)
Admission: EM | Admit: 2017-04-01 | Discharge: 2017-04-01 | Disposition: A | Discharge status: Home / Self Care | Payer: Medicaid Other | Attending: Emergency Medicine | Admitting: Emergency Medicine

## 2017-04-01 DIAGNOSIS — F17210 Nicotine dependence, cigarettes, uncomplicated: Secondary | ICD-10-CM | POA: Diagnosis not present

## 2017-04-01 DIAGNOSIS — M87351 Other secondary osteonecrosis, right femur: Secondary | ICD-10-CM | POA: Diagnosis not present

## 2017-04-01 DIAGNOSIS — Y999 Unspecified external cause status: Secondary | ICD-10-CM | POA: Diagnosis not present

## 2017-04-01 DIAGNOSIS — M87051 Idiopathic aseptic necrosis of right femur: Secondary | ICD-10-CM

## 2017-04-01 DIAGNOSIS — J45909 Unspecified asthma, uncomplicated: Secondary | ICD-10-CM | POA: Diagnosis not present

## 2017-04-01 DIAGNOSIS — X509XXA Other and unspecified overexertion or strenuous movements or postures, initial encounter: Secondary | ICD-10-CM | POA: Diagnosis not present

## 2017-04-01 DIAGNOSIS — Y929 Unspecified place or not applicable: Secondary | ICD-10-CM | POA: Diagnosis not present

## 2017-04-01 DIAGNOSIS — I10 Essential (primary) hypertension: Secondary | ICD-10-CM | POA: Insufficient documentation

## 2017-04-01 DIAGNOSIS — M87052 Idiopathic aseptic necrosis of left femur: Secondary | ICD-10-CM

## 2017-04-01 DIAGNOSIS — Z79899 Other long term (current) drug therapy: Secondary | ICD-10-CM | POA: Insufficient documentation

## 2017-04-01 DIAGNOSIS — M87352 Other secondary osteonecrosis, left femur: Secondary | ICD-10-CM | POA: Diagnosis not present

## 2017-04-01 DIAGNOSIS — M25551 Pain in right hip: Secondary | ICD-10-CM | POA: Diagnosis present

## 2017-04-01 DIAGNOSIS — Y939 Activity, unspecified: Secondary | ICD-10-CM | POA: Insufficient documentation

## 2017-04-01 DIAGNOSIS — W19XXXA Unspecified fall, initial encounter: Secondary | ICD-10-CM

## 2017-04-01 MED ORDER — HYDROCODONE-ACETAMINOPHEN 5-325 MG PO TABS: 1.0000 | ORAL_TABLET | ORAL | 0 refills | Status: DC | PRN

## 2017-04-01 MED ORDER — KETOROLAC TROMETHAMINE 30 MG/ML IJ SOLN
30.0000 mg | Freq: Once | INTRAMUSCULAR | Status: AC
  Administered 2017-04-01: 30 mg via INTRAMUSCULAR
  Filled 2017-04-01: qty 1

## 2017-04-01 MED ORDER — HYDROCODONE-ACETAMINOPHEN 5-325 MG PO TABS
1.0000 | ORAL_TABLET | Freq: Once | ORAL | Status: AC
  Administered 2017-04-01: 1 via ORAL
  Filled 2017-04-01: qty 1

## 2017-04-01 NOTE — ED Notes (Signed)
Patient is resting comfortably. Given 2 warm blankets.

## 2017-04-01 NOTE — ED Provider Notes (Signed)
[facility]-EMERGENCY DEPT Provider Note   CSN: [id number] Arrival date & time: 04/01/17  1046     History   Chief Complaint Chief Complaint  Patient presents with  . Hip Pain    HPI Ricardo Brooks is a 61 y.o. male.  Pt presents to the ED today with right hip pain s/p fall.  The pt told the nurse that he fell this morning, but told me he fell last night.  Pt has been unable to bear weight and has been getting around using a broom as a crutch.  Pt denies hitting his head or loc.  No other injuries.      Past Medical History:  Diagnosis Date  . Asthma   . Bipolar 1 disorder (HCC)   . Chronic back pain   . Hypertension     There are no active problems to display for this patient.   Past Surgical History:  Procedure Laterality Date  . orif right ankle Right 1985       Home Medications    Prior to Admission medications   Medication Sig Start Date End Date Taking? Authorizing Provider  acetaminophen (TYLENOL) 500 MG tablet Take 1,000 mg by mouth every 6 (six) hours as needed for mild pain.     Historical Provider, MD  ARIPiprazole (ABILIFY) 15 MG tablet Take 7.5 mg by mouth daily.    Historical Provider, MD  azithromycin (ZITHROMAX) 250 MG tablet Take 1 tablet (250 mg total) by mouth daily. Take first 2 tablets together, then 1 every day until finished. 03/03/17   Deborha Payment, PA-C  Cholecalciferol (VITAMIN D PO) Take 1 tablet by mouth daily.    Historical Provider, MD  Cyanocobalamin (VITAMIN B-12 PO) Take 1 tablet by mouth daily.    Historical Provider, MD  cyclobenzaprine (FLEXERIL) 10 MG tablet Take 1 tablet (10 mg total) by mouth 2 (two) times daily as needed for muscle spasms. 04/22/16   Fayrene Helper, PA-C  fluticasone (FLONASE) 50 MCG/ACT nasal spray Place 2 sprays into both nostrils daily. 03/03/17   Deborha Payment, PA-C  HYDROcodone-acetaminophen (NORCO/VICODIN) 5-325 MG tablet Take 1 tablet by mouth every 4 (four) hours as needed. 04/01/17   Jacalyn Lefevre, MD    ibuprofen (ADVIL,MOTRIN) 800 MG tablet Take 1 tablet (800 mg total) by mouth 3 (three) times daily. 04/22/16   Fayrene Helper, PA-C  loratadine (CLARITIN) 10 MG tablet Take 10 mg by mouth daily as needed for allergies.  09/09/13   [name], MD  meclizine (ANTIVERT) 25 MG tablet Take 1 tablet (25 mg total) by mouth 3 (three) times daily as needed for dizziness. 08/13/14   [name], MD  naproxen (NAPROSYN) 500 MG tablet Take 1 tablet (500 mg total) by mouth 2 (two) times daily. 01/23/16   Tinnie Gens Hedges, PA-C  ondansetron (ZOFRAN) 4 MG tablet Take 1 tablet (4 mg total) by mouth every 6 (six) hours. 07/09/14   Tiffany Neva Seat, PA-C  predniSONE (DELTASONE) 50 MG tablet Take 1 tablet (50 mg total) by mouth daily. 06/29/16   Charlestine Night, PA-C  traMADol (ULTRAM) 50 MG tablet Take 1 tablet (50 mg total) by mouth every 6 (six) hours as needed. 07/06/14   [name], PA-C  traMADol (ULTRAM) 50 MG tablet Take 1 tablet (50 mg total) by mouth every 6 (six) hours as needed for severe pain. 06/29/16   Charlestine Night, PA-C    Family History No family history on file.  Social History Social History  Substance Use Topics  .  Smoking status: Current Every Day Smoker    Packs/day: 0.50    Years: 18.00    Types: Cigarettes  . Smokeless tobacco: Never Used  . Alcohol use Yes     Comment: 2 drinks weekly      Allergies   Methocarbamol   Review of Systems Review of Systems  Musculoskeletal:       Right hip pain  All other systems reviewed and are negative.    Physical Exam Updated Vital Signs BP (!) 165/89 (BP Location: Right Arm)   Pulse (!) 57   Temp 98.6 F (37 C) (Oral)   Resp 16   SpO2 100%   Physical Exam  Constitutional: He is oriented to person, place, and time. He appears well-developed and well-nourished.  HENT:  Head: Normocephalic and atraumatic.  Right Ear: External ear normal.  Left Ear: External ear normal.  Nose: Nose normal.  Mouth/Throat: Oropharynx is clear and  moist.  Eyes: Conjunctivae and EOM are normal. Pupils are equal, round, and reactive to light.  Neck: Normal range of motion. Neck supple.  Cardiovascular: Normal rate, regular rhythm, normal heart sounds and intact distal pulses.   Pulmonary/Chest: Effort normal and breath sounds normal.  Abdominal: Soft. Bowel sounds are normal.  Musculoskeletal:       Right hip: He exhibits tenderness.  Neurological: He is alert and oriented to person, place, and time.  Skin: Skin is warm and dry.  Psychiatric: He has a normal mood and affect. His behavior is normal. Judgment and thought content normal.  Nursing note and vitals reviewed.    ED Treatments / Results  Labs (all labs ordered are listed, but only abnormal results are displayed) Labs Reviewed - No data to display  EKG  EKG Interpretation None       Radiology Dg Hip Unilat W Or Wo Pelvis 2-3 Views Right  Result Date: 04/01/2017 CLINICAL DATA:  Pain following fall EXAM: DG HIP (WITH OR WITHOUT PELVIS) 2-3V RIGHT COMPARISON:  None. FINDINGS: Frontal pelvis as well as frontal and lateral right hip images were obtained. There is no evident acute fracture or dislocation. There is narrowing of both hip joints with a slight degree of protrusio acetabuli bilaterally. There is sclerosis in both femoral heads, likely indicative of a degree of avascular necrosis on each side. These changes are more pronounced on the right than on the left. IMPRESSION: Evidence of avascular necrosis in the femoral heads, more apparent on the right than on the left. Generalized osteoarthritic change bilaterally. No acute fracture or dislocation. If further imaging evaluation with respect to avascular necrosis is felt to be warranted, MR would be the imaging study of choice to further assess. Electronically Signed   By: Bretta Bang III M.D.   On: 04/01/2017 11:30    Procedures Procedures (including critical care time)  Medications Ordered in ED Medications    HYDROcodone-acetaminophen (NORCO/VICODIN) 5-325 MG per tablet 1 tablet (not administered)  ketorolac (TORADOL) 30 MG/ML injection 30 mg (30 mg Intramuscular Given 04/01/17 1103)     Initial Impression / Assessment and Plan / ED Course  I have reviewed the triage vital signs and the nursing notes.  Pertinent labs & imaging results that were available during my care of the patient were reviewed by me and considered in my medical decision making (see chart for details).     Pt said that he's had decreased rom in both hips for "awhile."  He said that he has never seen an orthopedist for  his sx.  He denies steroid use.  Pt will be given crutches to help him walk.  He is given the number of ortho to f/u.  He knows to return if worse.  Final Clinical Impressions(s) / ED Diagnoses   Final diagnoses:  Fall, initial encounter  Right hip pain  Avascular necrosis of bone of left hip (HCC)  Avascular necrosis of bone of hip, right (HCC)    New Prescriptions New Prescriptions   HYDROCODONE-ACETAMINOPHEN (NORCO/VICODIN) 5-325 MG TABLET    Take 1 tablet by mouth every 4 (four) hours as needed.     Jacalyn Lefevre, MD 04/01/17 1155

## 2017-04-01 NOTE — ED Triage Notes (Signed)
Pt reports to the ED for eval of right hip pain following a fall that occurred at approx 9-10 am. He states that he fell and twisted his hip and hit his right hip on a metal table. He is unable to bear weight because it causes increased pain but was able to move through the house with a broom. He denies any head injury or LOC. He states he tripped and did not have a syncopal episode.

## 2017-05-05 ENCOUNTER — Encounter (HOSPITAL_COMMUNITY): Payer: Self-pay

## 2017-05-05 ENCOUNTER — Emergency Department (HOSPITAL_COMMUNITY)
Admission: EM | Admit: 2017-05-05 | Discharge: 2017-05-05 | Disposition: A | Discharge status: Home / Self Care | Payer: Medicaid Other | Attending: Emergency Medicine | Admitting: Emergency Medicine

## 2017-05-05 DIAGNOSIS — M25551 Pain in right hip: Secondary | ICD-10-CM | POA: Insufficient documentation

## 2017-05-05 DIAGNOSIS — Y929 Unspecified place or not applicable: Secondary | ICD-10-CM | POA: Diagnosis not present

## 2017-05-05 DIAGNOSIS — I10 Essential (primary) hypertension: Secondary | ICD-10-CM | POA: Insufficient documentation

## 2017-05-05 DIAGNOSIS — J45909 Unspecified asthma, uncomplicated: Secondary | ICD-10-CM | POA: Diagnosis not present

## 2017-05-05 DIAGNOSIS — F17210 Nicotine dependence, cigarettes, uncomplicated: Secondary | ICD-10-CM | POA: Insufficient documentation

## 2017-05-05 DIAGNOSIS — Y999 Unspecified external cause status: Secondary | ICD-10-CM | POA: Insufficient documentation

## 2017-05-05 DIAGNOSIS — W19XXXA Unspecified fall, initial encounter: Secondary | ICD-10-CM | POA: Diagnosis not present

## 2017-05-05 DIAGNOSIS — Y939 Activity, unspecified: Secondary | ICD-10-CM | POA: Diagnosis not present

## 2017-05-05 MED ORDER — TRAMADOL HCL 50 MG PO TABS
50.0000 mg | ORAL_TABLET | Freq: Once | ORAL | Status: AC
  Administered 2017-05-05: 50 mg via ORAL
  Filled 2017-05-05: qty 1

## 2017-05-05 MED ORDER — TRAMADOL HCL 50 MG PO TABS: 50.0000 mg | ORAL_TABLET | Freq: Four times a day (QID) | ORAL | 0 refills | Status: DC | PRN

## 2017-05-05 MED ORDER — IBUPROFEN 800 MG PO TABS: 800.0000 mg | ORAL_TABLET | Freq: Three times a day (TID) | ORAL | 0 refills | Status: DC | PRN

## 2017-05-05 NOTE — ED Triage Notes (Signed)
PT requesting pain pill before DC.

## 2017-05-05 NOTE — Discharge Instructions (Signed)
You were seen in the ED today with continued right hip pain. The x-ray from last ED visit shows avascular necrosis of the right hip. You need to follow-up with your primary care physician in the orthopedic surgeon listed below.  Return to the emergency department any fever, chills, leg numbness, weakness, or other concerning symptoms.

## 2017-05-05 NOTE — ED Notes (Signed)
Declined W/C at D/C and was escorted to lobby by RN. 

## 2017-05-05 NOTE — ED Triage Notes (Signed)
Pt seen in ED about one month ago for right hip pain.  Pt here for ortho referral and pain control.  Uses crutch at times.

## 2017-05-05 NOTE — ED Provider Notes (Signed)
Emergency Department Provider Note   I have reviewed the triage vital signs and the nursing notes.   HISTORY  Chief Complaint Hip Pain   HPI Ricardo Brooks is a 61 y.o. male with PMH of asthma, Bipolar, PTSD, chronic back pain, and HTN presents to the emergency department for evaluation of continued right hip pain. Patient had a fall approximately a month and a half ago and was evaluated in the emergency department after the fall. He had an x-ray which diagnosed avascular necrosis of both hips but no fracture. No new injuries or worsening pain since the event. She did not have insurance at the time but yesterday is able to obtain insurance and so came back to the emergency department for evaluation and referral. He is ambulatory without crutches but occasionally needs them at home. The fevers or chills. No numbness or tingling in the legs. No back pain. Pain is worse with movement or ambulation.   Past Medical History:  Diagnosis Date  . Asthma   . Bipolar 1 disorder (HCC)   . Chronic back pain   . Hypertension     There are no active problems to display for this patient.   Past Surgical History:  Procedure Laterality Date  . orif right ankle Right 1985    Current Outpatient Rx  . Order #: [id number] Class: Historical Med  . Order #: [id number] Class: Historical Med  . Order #: [id number] Class: Print  . Order #: [id number] Class: Historical Med  . Order #: [id number] Class: Historical Med  . Order #: [id number] Class: Print  . Order #: [id number] Class: Print  . Order #: [id number] Class: Print  . Order #: [id number] Class: Print  . Order #: [id number] Class: Historical Med  . Order #: [id number] Class: Print  . Order #: [id number] Class: Print  . Order #: [id number] Class: Print  . Order #: [id number] Class: Print  . Order #: [id number] Class: Print    Allergies Methocarbamol  History reviewed. No pertinent family history.  Social History Social History  Substance Use Topics  . Smoking  status: Current Every Day Smoker    Packs/day: 0.50    Years: 18.00    Types: Cigarettes  . Smokeless tobacco: Never Used  . Alcohol use Yes     Comment: 2 drinks weekly     Review of Systems  Constitutional: No fever/chills Eyes: No visual changes. ENT: No sore throat. Cardiovascular: Denies chest pain. Respiratory: Denies shortness of breath. Gastrointestinal: No abdominal pain.  No nausea, no vomiting.  No diarrhea.  No constipation. Genitourinary: Negative for dysuria. Musculoskeletal: Negative for back pain. Positive right hip pain.  Skin: Negative for rash. Neurological: Negative for headaches, focal weakness or numbness.  10-point ROS otherwise negative.  ____________________________________________   PHYSICAL EXAM:  VITAL SIGNS: ED Triage Vitals  Enc Vitals Group     BP 05/05/17 1541 140/84     Pulse Rate 05/05/17 1541 (!) 59     Resp 05/05/17 1541 16     Temp 05/05/17 1541 98 F (36.7 C)     Temp src --      SpO2 05/05/17 1541 100 %     Weight 05/05/17 1542 145 lb (65.8 kg)     Height 05/05/17 1542 5' 9.5" (1.765 m)     Pain Score 05/05/17 1539 7   Constitutional: Alert and oriented. Well appearing and in no acute distress. Eyes: Conjunctivae are normal. Head: Atraumatic. Nose: No congestion/rhinnorhea. Mouth/Throat: Mucous membranes are moist.  Oropharynx non-erythematous. Neck: No stridor.   Cardiovascular: Normal rate, regular rhythm.  Good peripheral circulation. Grossly normal heart sounds.   Respiratory: Normal respiratory effort.  No retractions. Lungs CTAB. Gastrointestinal: Soft and nontender. No distention.  Musculoskeletal: No lower extremity edema. No gross deformities of extremities. Positive slight decreased ROM of right hip. Normal pulses and sensation. No deformity. No knee or ankle pain.  Neurologic:  Normal speech and language. No gross focal neurologic deficits are appreciated.  Skin:  Skin is warm, dry and intact. No rash  noted. Psychiatric: Mood and affect are normal. Speech and behavior are normal.  ____________________________________________   PROCEDURES  Procedure(s) performed:   Procedures  None ____________________________________________   INITIAL IMPRESSION / ASSESSMENT AND PLAN / ED COURSE  Pertinent labs & imaging results that were available during my care of the patient were reviewed by me and considered in my medical decision making (see chart for details).  Patient resents to the emergency department for evaluation of right hip pain. He was diagnosed with avascular necrosis of the hips during his last ED visit. He obtained insurance and return to the emergency department for referral to orthopedics. No new injury. No significant worsening pain. Plan for small amount of pain medication at home and will provide contact information for orthopedic surgery. I reviewed the plain film from 04/01/2017 and do not see clear indication for repeat plain film at this time. No indication for emergency MRI. Patient is ambulatory in the emergency department without difficulty. Will follow with PCP and provided contact information for Orthopedic surgery.   I discussed smoking cessation in detail with the patient he reports cutting back since his last ED visit and he will continue to try and eliminate smoking behavior.   At this time, I do not feel there is any life-threatening condition present. I have reviewed and discussed all results (EKG, imaging, lab, urine as appropriate), exam findings with patient. I have reviewed nursing notes and appropriate previous records.  I feel the patient is safe to be discharged home without further emergent workup. Discussed usual and customary return precautions. Patient and family (if present) verbalize understanding and are comfortable with this plan.  Patient will follow-up with their primary care provider. If they do not have a primary care provider, information for  follow-up has been provided to them. All questions have been answered.    ____________________________________________  FINAL CLINICAL IMPRESSION(S) / ED DIAGNOSES  Final diagnoses:  Right hip pain     MEDICATIONS GIVEN DURING THIS VISIT:  Medications - No data to display   NEW OUTPATIENT MEDICATIONS STARTED DURING THIS VISIT:  New Prescriptions   IBUPROFEN (ADVIL,MOTRIN) 800 MG TABLET    Take 1 tablet (800 mg total) by mouth every 8 (eight) hours as needed.   TRAMADOL (ULTRAM) 50 MG TABLET    Take 1 tablet (50 mg total) by mouth every 6 (six) hours as needed.      Note:  This document was prepared using Dragon voice recognition software and may include unintentional dictation errors.  Alona BeneJoshua Long, MD Emergency Medicine   Long, Arlyss RepressJoshua G, MD 05/05/17 [phone]

## 2017-05-28 ENCOUNTER — Ambulatory Visit (INDEPENDENT_AMBULATORY_CARE_PROVIDER_SITE_OTHER): Payer: Self-pay | Admitting: Orthopaedic Surgery

## 2017-06-26 ENCOUNTER — Ambulatory Visit (INDEPENDENT_AMBULATORY_CARE_PROVIDER_SITE_OTHER): Payer: Medicaid Other | Admitting: Orthopedic Surgery

## 2017-06-26 ENCOUNTER — Ambulatory Visit (INDEPENDENT_AMBULATORY_CARE_PROVIDER_SITE_OTHER): Payer: Medicaid Other | Admitting: Orthopaedic Surgery

## 2017-06-26 ENCOUNTER — Encounter (INDEPENDENT_AMBULATORY_CARE_PROVIDER_SITE_OTHER): Payer: Self-pay

## 2017-06-26 ENCOUNTER — Ambulatory Visit (INDEPENDENT_AMBULATORY_CARE_PROVIDER_SITE_OTHER): Payer: Medicaid Other

## 2017-06-26 ENCOUNTER — Encounter (INDEPENDENT_AMBULATORY_CARE_PROVIDER_SITE_OTHER): Payer: Self-pay | Admitting: Orthopaedic Surgery

## 2017-06-26 DIAGNOSIS — M1612 Unilateral primary osteoarthritis, left hip: Secondary | ICD-10-CM | POA: Diagnosis not present

## 2017-06-26 DIAGNOSIS — M25551 Pain in right hip: Secondary | ICD-10-CM

## 2017-06-26 DIAGNOSIS — M25552 Pain in left hip: Secondary | ICD-10-CM

## 2017-06-26 DIAGNOSIS — M1611 Unilateral primary osteoarthritis, right hip: Secondary | ICD-10-CM | POA: Insufficient documentation

## 2017-06-26 MED ORDER — TRAMADOL HCL 50 MG PO TABS: 50.0000 mg | ORAL_TABLET | Freq: Four times a day (QID) | ORAL | 3 refills | Status: DC | PRN

## 2017-06-26 NOTE — Progress Notes (Signed)
Office Visit Note   Patient: Ricardo Brooks           Date of Birth: 08/05/1956           MRN: [id number] Visit Date: 06/26/2017              Requested by: Gilda Crease, MD [address] [place], [place] [address] PCP: Gilda Crease, MD   Assessment & Plan: Visit Diagnoses:  1. Bilateral hip pain   2. Primary osteoarthritis of right hip   3. Primary osteoarthritis of left hip     Plan: Patient has advanced bilateral degenerative joint disease of his hips. We discussed surgical versus nonsurgical options. Given his severity of disease I don't think that conservative treatment will give him much relief which she is in agreement with. Tramadol refilled. He will give [place] a call back in a couple months when he is ready to undergo hip replacement.we discussed the surgery in detail including the risk benefits alternatives.  Follow-Up Instructions: Return if symptoms worsen or fail to improve.   Orders:  Orders Placed This Encounter  Procedures  . XR HIP UNILAT W OR W/O PELVIS 2-3 VIEWS LEFT  . XR HIP UNILAT W OR W/O PELVIS 2-3 VIEWS RIGHT   Meds ordered this encounter  Medications  . traMADol (ULTRAM) 50 MG tablet    Sig: Take 1-2 tablets (50-100 mg total) by mouth every 6 (six) hours as needed.    Dispense:  60 tablet    Refill:  3      Procedures: No procedures performed   Clinical Data: No additional findings.   Subjective: Chief Complaint  Patient presents with  . Right Hip - Pain  . Left Hip - Pain    Ricardo Brooks is a 61 year old gentleman with bilateral pain for about a year. He endorses numbness and groin pain that radiates down the thigh. He is currently taking tramadol with partial relief. He is limping. He denies a history of steroid use, sickle cell anemia, heavy alcohol use. He denies any back pain or radicular symptoms.    Review of Systems  Constitutional: Negative.   All other systems reviewed and are  negative.    Objective: Vital Signs: There were no vitals taken for this visit.  Physical Exam  Constitutional: He is oriented to person, place, and time. He appears well-developed and well-nourished.  HENT:  Head: Normocephalic and atraumatic.  Eyes: Pupils are equal, round, and reactive to light.  Neck: Neck supple.  Pulmonary/Chest: Effort normal.  Abdominal: Soft.  Musculoskeletal: Normal range of motion.  Neurological: He is alert and oriented to person, place, and time.  Skin: Skin is warm.  Psychiatric: He has a normal mood and affect. His behavior is normal. Judgment and thought content normal.  Nursing note and vitals reviewed.   Ortho Exam Bilateral hip exam shows very painful and limited range of motion of both hips. Positive Stinchfield sign. No sciatic tension signs. Specialty Comments:  No specialty comments available.  Imaging: Xr Hip Unilat W Or W/o Pelvis 2-3 Views Left  Result Date: 06/26/2017 Advanced bilateral degenerative joint disease  Xr Hip Unilat W Or W/o Pelvis 2-3 Views Right  Result Date: 06/26/2017 Advanced bilateral degenerative joint disease    PMFS History: Patient Active Problem List   Diagnosis Date Noted  . Primary osteoarthritis of right hip 06/26/2017  . Primary osteoarthritis of left hip 06/26/2017   Past Medical History:  Diagnosis Date  . Asthma   .  Bipolar 1 disorder (HCC)   . Chronic back pain   . Hypertension     No family history on file.  Past Surgical History:  Procedure Laterality Date  . orif right ankle Right 1985   Social History   Occupational History  . Not on file.   Social History Main Topics  . Smoking status: Current Every Day Smoker    Packs/day: 0.50    Years: 18.00    Types: Cigarettes  . Smokeless tobacco: Never Used  . Alcohol use Yes     Comment: 2 drinks weekly   . Drug use: No  . Sexual activity: Not on file

## 2017-08-15 ENCOUNTER — Encounter (HOSPITAL_COMMUNITY): Payer: Self-pay | Admitting: Emergency Medicine

## 2017-08-15 ENCOUNTER — Emergency Department (HOSPITAL_COMMUNITY)
Admission: EM | Admit: 2017-08-15 | Discharge: 2017-08-15 | Discharge status: Against Medical Advice | Payer: Medicaid Other | Attending: Emergency Medicine | Admitting: Emergency Medicine

## 2017-08-15 DIAGNOSIS — M25551 Pain in right hip: Secondary | ICD-10-CM | POA: Insufficient documentation

## 2017-08-15 DIAGNOSIS — Z5321 Procedure and treatment not carried out due to patient leaving prior to being seen by health care provider: Secondary | ICD-10-CM | POA: Insufficient documentation

## 2017-08-15 HISTORY — DX: Post-traumatic stress disorder, unspecified: F43.10

## 2017-08-15 HISTORY — DX: Pain in unspecified hip: M25.559

## 2017-08-15 HISTORY — DX: Other chronic pain: G89.29

## 2017-08-15 MED ORDER — OXYCODONE-ACETAMINOPHEN 5-325 MG PO TABS
1.0000 | ORAL_TABLET | Freq: Once | ORAL | Status: AC
  Administered 2017-08-15: 1 via ORAL

## 2017-08-15 MED ORDER — OXYCODONE-ACETAMINOPHEN 5-325 MG PO TABS
ORAL_TABLET | ORAL | Status: AC
  Filled 2017-08-15: qty 1

## 2017-08-15 NOTE — ED Triage Notes (Signed)
Patient reports chronic right hip pain for > 1 year , denies injury or fall , pain worsened today while at work .

## 2017-08-15 NOTE — ED Notes (Signed)
Called pt to re-assess vitals, pt did not answer.  

## 2017-08-17 ENCOUNTER — Encounter (HOSPITAL_COMMUNITY): Payer: Self-pay | Admitting: *Deleted

## 2017-08-17 ENCOUNTER — Emergency Department (HOSPITAL_COMMUNITY)
Admission: EM | Admit: 2017-08-17 | Discharge: 2017-08-17 | Disposition: A | Discharge status: Home / Self Care | Payer: Medicaid Other | Attending: Emergency Medicine | Admitting: Emergency Medicine

## 2017-08-17 DIAGNOSIS — F319 Bipolar disorder, unspecified: Secondary | ICD-10-CM | POA: Insufficient documentation

## 2017-08-17 DIAGNOSIS — F17210 Nicotine dependence, cigarettes, uncomplicated: Secondary | ICD-10-CM | POA: Diagnosis not present

## 2017-08-17 DIAGNOSIS — I10 Essential (primary) hypertension: Secondary | ICD-10-CM | POA: Diagnosis not present

## 2017-08-17 DIAGNOSIS — M25552 Pain in left hip: Secondary | ICD-10-CM | POA: Diagnosis present

## 2017-08-17 DIAGNOSIS — M25551 Pain in right hip: Secondary | ICD-10-CM | POA: Diagnosis not present

## 2017-08-17 DIAGNOSIS — Z79899 Other long term (current) drug therapy: Secondary | ICD-10-CM | POA: Diagnosis not present

## 2017-08-17 DIAGNOSIS — J45909 Unspecified asthma, uncomplicated: Secondary | ICD-10-CM | POA: Insufficient documentation

## 2017-08-17 MED ORDER — HYDROCODONE-ACETAMINOPHEN 5-325 MG PO TABS: 1.0000 | ORAL_TABLET | ORAL | 0 refills | Status: DC | PRN

## 2017-08-17 MED ORDER — HYDROCODONE-ACETAMINOPHEN 5-325 MG PO TABS
1.0000 | ORAL_TABLET | Freq: Once | ORAL | Status: AC
  Administered 2017-08-17: 1 via ORAL
  Filled 2017-08-17 (×2): qty 1

## 2017-08-17 MED ORDER — TRAMADOL HCL 50 MG PO TABS: 50.0000 mg | ORAL_TABLET | Freq: Four times a day (QID) | ORAL | 0 refills | Status: DC | PRN
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
## 2017-08-17 NOTE — ED Triage Notes (Signed)
C/o pain in right hip and knee onset 1 year ago , states he is suppose to have a hip replacement

## 2017-08-27 ENCOUNTER — Ambulatory Visit (INDEPENDENT_AMBULATORY_CARE_PROVIDER_SITE_OTHER): Payer: Self-pay | Admitting: Orthopedic Surgery

## 2017-08-27 ENCOUNTER — Encounter (INDEPENDENT_AMBULATORY_CARE_PROVIDER_SITE_OTHER): Payer: Self-pay

## 2017-09-04 ENCOUNTER — Emergency Department (HOSPITAL_COMMUNITY)
Admission: EM | Admit: 2017-09-04 | Discharge: 2017-09-04 | Disposition: A | Discharge status: Home / Self Care | Payer: Medicaid Other | Attending: Emergency Medicine | Admitting: Emergency Medicine

## 2017-09-04 ENCOUNTER — Encounter (HOSPITAL_COMMUNITY): Payer: Self-pay | Admitting: Emergency Medicine

## 2017-09-04 DIAGNOSIS — G8929 Other chronic pain: Secondary | ICD-10-CM | POA: Insufficient documentation

## 2017-09-04 DIAGNOSIS — M25559 Pain in unspecified hip: Secondary | ICD-10-CM | POA: Insufficient documentation

## 2017-09-04 DIAGNOSIS — F17210 Nicotine dependence, cigarettes, uncomplicated: Secondary | ICD-10-CM | POA: Insufficient documentation

## 2017-09-04 DIAGNOSIS — Z79899 Other long term (current) drug therapy: Secondary | ICD-10-CM | POA: Insufficient documentation

## 2017-09-04 DIAGNOSIS — Z760 Encounter for issue of repeat prescription: Secondary | ICD-10-CM | POA: Insufficient documentation

## 2017-09-04 DIAGNOSIS — J45909 Unspecified asthma, uncomplicated: Secondary | ICD-10-CM | POA: Insufficient documentation

## 2017-09-04 DIAGNOSIS — I10 Essential (primary) hypertension: Secondary | ICD-10-CM | POA: Insufficient documentation

## 2017-09-04 MED ORDER — HYDROCODONE-ACETAMINOPHEN 5-325 MG PO TABS
1.0000 | ORAL_TABLET | Freq: Once | ORAL | Status: AC
  Administered 2017-09-04: 1 via ORAL
  Filled 2017-09-04: qty 1
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
## 2017-09-04 NOTE — ED Triage Notes (Signed)
Pt reports chronic pain in R hip and R knee, states out of pain meds at home.

## 2017-09-04 NOTE — Discharge Instructions (Signed)
Please see your orthopedic specialist or primary care provider for pain medication prescriptions, as the emergency department is unable to refill these prescriptions for you. Try putting ice, for 20 minutes at a time to your areas of pain. You can take over-the-counter anti-inflammatories for pain.

## 2017-09-06 ENCOUNTER — Encounter (INDEPENDENT_AMBULATORY_CARE_PROVIDER_SITE_OTHER): Payer: Self-pay | Admitting: Orthopedic Surgery

## 2017-09-06 ENCOUNTER — Ambulatory Visit (INDEPENDENT_AMBULATORY_CARE_PROVIDER_SITE_OTHER): Payer: Medicaid Other | Admitting: Orthopedic Surgery

## 2017-09-06 DIAGNOSIS — M1611 Unilateral primary osteoarthritis, right hip: Secondary | ICD-10-CM

## 2017-09-06 NOTE — Progress Notes (Signed)
   Office Visit Note   Patient: Ricardo Brooks           Date of Birth: [birth year]           MRN: [id number] Visit Date: 09/06/2017              Requested by: Gilda Crease, MD [address] [place], [place] [address] PCP: Gilda Crease, MD  Chief Complaint  Patient presents with  . Right Hip - Pain      HPI: Patient is a 61 year old gentleman who states he's had a chronic history of lower back pain headaches and bilateral hip pain is worse now the right hip than the left. Patient states that he takes tramadol for his back pain and headaches. Recent radiographs were obtained in the emergency room.  Assessment & Plan: Visit Diagnoses:  1. Primary osteoarthritis of right hip     Plan: Patient has severe osteoarthritis of both hips right worse than left. I will have him follow-up with Dr. Magnus Ivan as soon as possible for evaluation for total hip arthroplasty. Recommended against using narcotics or tramadol preoperatively discussed the risk of dependence and inability for the pain medicine to work postoperatively. Recommended using nonsteroidals patient states that he cannot tolerate any nonsteroidal.  Follow-Up Instructions: Return in about 2 weeks (around 09/20/2017).   Ortho Exam  Patient is alert, oriented, no adenopathy, well-dressed, normal affect, normal respiratory effort. Examination patient has an abductor lurch on the right. He has essentially no range of motion with internal/external rotation of the right hip. He has decreased flexion and extension. Both lower extremities are neurovascularly intact there is no sciatic tension sign. No focal motor weakness.  Imaging: No results found. No images are attached to the encounter.  Labs: No results found for: HGBA1C, ESRSEDRATE, CRP, LABURIC, REPTSTATUS, GRAMSTAIN, CULT, LABORGA  Orders:  No orders of the defined types were placed in this encounter.  No orders of the defined types were placed in this  encounter.    Procedures: No procedures performed  Clinical Data: No additional findings.  ROS:  All other systems negative, except as noted in the HPI. Review of Systems  Objective: Vital Signs: There were no vitals taken for this visit.  Specialty Comments:  No specialty comments available.  PMFS History: Patient Active Problem List   Diagnosis Date Noted  . Primary osteoarthritis of right hip 06/26/2017  . Primary osteoarthritis of left hip 06/26/2017   Past Medical History:  Diagnosis Date  . Asthma   . Bipolar 1 disorder (HCC)   . Chronic back pain   . Hip pain, chronic   . Hypertension   . PTSD (post-traumatic stress disorder)     No family history on file.  Past Surgical History:  Procedure Laterality Date  . orif right ankle Right 1985   Social History   Occupational History  . Not on file.   Social History Main Topics  . Smoking status: Current Every Day Smoker    Packs/day: 0.50    Types: Cigarettes  . Smokeless tobacco: Never Used  . Alcohol use Yes  . Drug use: No  . Sexual activity: Not on file

## 2017-09-18 ENCOUNTER — Telehealth (INDEPENDENT_AMBULATORY_CARE_PROVIDER_SITE_OTHER): Payer: Self-pay | Admitting: Orthopaedic Surgery

## 2017-09-18 NOTE — Telephone Encounter (Signed)
Returned call to patient left message for call a back [phone]

## 2017-09-20 ENCOUNTER — Ambulatory Visit (INDEPENDENT_AMBULATORY_CARE_PROVIDER_SITE_OTHER): Payer: Medicaid Other | Admitting: Orthopaedic Surgery

## 2017-09-20 ENCOUNTER — Encounter (INDEPENDENT_AMBULATORY_CARE_PROVIDER_SITE_OTHER): Payer: Self-pay

## 2017-09-25 ENCOUNTER — Ambulatory Visit (INDEPENDENT_AMBULATORY_CARE_PROVIDER_SITE_OTHER): Payer: Medicaid Other | Admitting: Orthopaedic Surgery

## 2017-09-25 ENCOUNTER — Emergency Department (HOSPITAL_COMMUNITY)
Admission: EM | Admit: 2017-09-25 | Discharge: 2017-09-25 | Discharge status: Against Medical Advice | Payer: Medicaid Other | Attending: Emergency Medicine | Admitting: Emergency Medicine

## 2017-09-25 ENCOUNTER — Encounter (HOSPITAL_COMMUNITY): Payer: Self-pay

## 2017-09-25 ENCOUNTER — Encounter: Payer: Self-pay | Admitting: Gastroenterology

## 2017-09-25 ENCOUNTER — Encounter (INDEPENDENT_AMBULATORY_CARE_PROVIDER_SITE_OTHER): Payer: Self-pay

## 2017-09-25 DIAGNOSIS — Z5321 Procedure and treatment not carried out due to patient leaving prior to being seen by health care provider: Secondary | ICD-10-CM | POA: Diagnosis not present

## 2017-09-25 DIAGNOSIS — M25551 Pain in right hip: Secondary | ICD-10-CM | POA: Diagnosis not present

## 2017-09-25 NOTE — ED Triage Notes (Signed)
Pt reports that he missed an appointment at Dr. Eliberto IvoryBlackman's office this morning at 0900 for chronic right hip pain. Pt had appointment rescheduled to 10/29 but states his pain is too severe and not being controlled with hydrocodone and tramadol.

## 2017-09-25 NOTE — ED Notes (Signed)
Called patient for room x3, no answer.  

## 2017-09-25 NOTE — ED Notes (Signed)
Called patient name in lobby with no answer

## 2017-10-01 ENCOUNTER — Ambulatory Visit (INDEPENDENT_AMBULATORY_CARE_PROVIDER_SITE_OTHER): Payer: Medicaid Other | Admitting: Orthopaedic Surgery

## 2017-10-01 DIAGNOSIS — M1612 Unilateral primary osteoarthritis, left hip: Secondary | ICD-10-CM | POA: Diagnosis not present

## 2017-10-01 DIAGNOSIS — M1611 Unilateral primary osteoarthritis, right hip: Secondary | ICD-10-CM | POA: Diagnosis not present

## 2017-10-01 MED ORDER — TRAMADOL HCL 50 MG PO TABS: 50.0000 mg | ORAL_TABLET | Freq: Four times a day (QID) | ORAL | 0 refills | Status: DC | PRN

## 2017-10-01 NOTE — Progress Notes (Signed)
The patient is actually very pleasant 61 year old gentleman with debilitating arthritis involving his right and left hips.  His right hip pain is 10 out of 10 at this point.  It is detrimentally affecting her activities of daily living his quality of life is mobility.  He is actually a patient of one my partners Dr. Roda ShuttersXu who actually saw him in July of this year and recommended hip replacement surgery.  He then saw one of my other partners more recently and that partner sent him to me for hip replacement surgery.  The patient is actually happy with seeing Dr. Roda ShuttersXU.  Is worsened.  He has x-rays that are already on the system and show severe end-stage arthritis of both his hips worse on the right than the left.  On exam he has significant pain with internal/external rotation of both hips with the right worse than left.  His leg lengths are equal.  I went over in detail with him hip replacement surgery and showed him the hip model and his x-rays.  We discussed the intraoperative and postoperative course of surgery as well as with the risk and benefits are of the surgery.  I gave him a handout on hip replacement surgery as well.  I do feel that it is medically necessary at this point given his daily pain in the detrimental effect this is having on his life and mobility.  We will refill his tramadol again we will work on getting the surgery set up with Dr. Roda ShuttersXU.  All questions and concerns were answered and addressed.  I did refill his tramadol as well.

## 2017-11-06 ENCOUNTER — Telehealth: Payer: Self-pay

## 2017-11-06 NOTE — Telephone Encounter (Signed)
Patient no showed for Pre-Visit. A message was left on his phone to call and reschedule PV before 5:00 Pm today or will cancel his scheduled colonoscopy. If patient does not call by 5:00 a no show letter will be mailed to his home.    Janalee DaneNancy Massey Ruhland, LPN ( PV )

## 2017-11-08 ENCOUNTER — Encounter: Payer: Self-pay | Admitting: Specialist

## 2017-11-13 ENCOUNTER — Encounter: Payer: Medicaid Other | Admitting: Gastroenterology

## 2017-11-16 ENCOUNTER — Telehealth (INDEPENDENT_AMBULATORY_CARE_PROVIDER_SITE_OTHER): Payer: Self-pay | Admitting: Orthopaedic Surgery

## 2017-11-16 NOTE — Telephone Encounter (Signed)
Are you going to do the SU?

## 2017-11-16 NOTE — Telephone Encounter (Signed)
Patient left a vm message stating that he would like to speak with someone about the surgery Dr. Magnus IvanBlackman had recommended, but he believed Dr. Roda ShuttersXU was going to do the surgery.  CB#[phone]

## 2017-11-16 NOTE — Telephone Encounter (Signed)
Yes.  Please go ahead and schedule him

## 2017-12-17 NOTE — Telephone Encounter (Signed)
I called patient to discuss surgery scheduling.  He wants to come in and see Dr. Roda ShuttersXu first before scheduling.  I offered to make him an appt.  He said he would call back to schedule office visit when he can get some transportation arranged.

## 2018-02-12 ENCOUNTER — Emergency Department (HOSPITAL_COMMUNITY): Payer: Medicaid Other

## 2018-02-12 ENCOUNTER — Encounter (HOSPITAL_COMMUNITY): Payer: Self-pay | Admitting: Emergency Medicine

## 2018-02-12 ENCOUNTER — Inpatient Hospital Stay (HOSPITAL_COMMUNITY)
Admission: EM | Admit: 2018-02-12 | Discharge: 2018-02-17 | DRG: 286 | Disposition: A | Discharge status: Home / Self Care | Payer: Medicaid Other | Attending: Cardiology | Admitting: Cardiology

## 2018-02-12 ENCOUNTER — Other Ambulatory Visit: Payer: Self-pay

## 2018-02-12 DIAGNOSIS — I110 Hypertensive heart disease with heart failure: Principal | ICD-10-CM | POA: Diagnosis present

## 2018-02-12 DIAGNOSIS — I4892 Unspecified atrial flutter: Secondary | ICD-10-CM | POA: Diagnosis not present

## 2018-02-12 DIAGNOSIS — M549 Dorsalgia, unspecified: Secondary | ICD-10-CM | POA: Diagnosis present

## 2018-02-12 DIAGNOSIS — R0609 Other forms of dyspnea: Secondary | ICD-10-CM

## 2018-02-12 DIAGNOSIS — I2510 Atherosclerotic heart disease of native coronary artery without angina pectoris: Secondary | ICD-10-CM

## 2018-02-12 DIAGNOSIS — G8929 Other chronic pain: Secondary | ICD-10-CM | POA: Diagnosis present

## 2018-02-12 DIAGNOSIS — Z888 Allergy status to other drugs, medicaments and biological substances status: Secondary | ICD-10-CM

## 2018-02-12 DIAGNOSIS — I10 Essential (primary) hypertension: Secondary | ICD-10-CM | POA: Diagnosis not present

## 2018-02-12 DIAGNOSIS — R7989 Other specified abnormal findings of blood chemistry: Secondary | ICD-10-CM | POA: Diagnosis present

## 2018-02-12 DIAGNOSIS — R590 Localized enlarged lymph nodes: Secondary | ICD-10-CM | POA: Diagnosis present

## 2018-02-12 DIAGNOSIS — E785 Hyperlipidemia, unspecified: Secondary | ICD-10-CM | POA: Diagnosis present

## 2018-02-12 DIAGNOSIS — R778 Other specified abnormalities of plasma proteins: Secondary | ICD-10-CM

## 2018-02-12 DIAGNOSIS — I255 Ischemic cardiomyopathy: Secondary | ICD-10-CM | POA: Diagnosis present

## 2018-02-12 DIAGNOSIS — I513 Intracardiac thrombosis, not elsewhere classified: Secondary | ICD-10-CM | POA: Diagnosis present

## 2018-02-12 DIAGNOSIS — F17210 Nicotine dependence, cigarettes, uncomplicated: Secondary | ICD-10-CM | POA: Diagnosis present

## 2018-02-12 DIAGNOSIS — Z9114 Patient's other noncompliance with medication regimen: Secondary | ICD-10-CM

## 2018-02-12 DIAGNOSIS — I2582 Chronic total occlusion of coronary artery: Secondary | ICD-10-CM | POA: Diagnosis present

## 2018-02-12 DIAGNOSIS — Z79899 Other long term (current) drug therapy: Secondary | ICD-10-CM

## 2018-02-12 DIAGNOSIS — Z720 Tobacco use: Secondary | ICD-10-CM

## 2018-02-12 DIAGNOSIS — M160 Bilateral primary osteoarthritis of hip: Secondary | ICD-10-CM | POA: Diagnosis present

## 2018-02-12 DIAGNOSIS — I272 Pulmonary hypertension, unspecified: Secondary | ICD-10-CM | POA: Diagnosis present

## 2018-02-12 DIAGNOSIS — I240 Acute coronary thrombosis not resulting in myocardial infarction: Secondary | ICD-10-CM

## 2018-02-12 DIAGNOSIS — F4310 Post-traumatic stress disorder, unspecified: Secondary | ICD-10-CM | POA: Diagnosis present

## 2018-02-12 DIAGNOSIS — F319 Bipolar disorder, unspecified: Secondary | ICD-10-CM | POA: Diagnosis present

## 2018-02-12 DIAGNOSIS — I5021 Acute systolic (congestive) heart failure: Secondary | ICD-10-CM | POA: Diagnosis present

## 2018-02-12 DIAGNOSIS — I252 Old myocardial infarction: Secondary | ICD-10-CM

## 2018-02-12 DIAGNOSIS — R0602 Shortness of breath: Secondary | ICD-10-CM

## 2018-02-12 LAB — I-STAT TROPONIN, ED: TROPONIN I, POC: 0.16 ng/mL — AB (ref 0.00–0.08)

## 2018-02-12 LAB — CBC
HCT: 44.4 % (ref 39.0–52.0)
HCT: 39.5 % (ref 39.0–52.0)
Hemoglobin: 14.2 g/dL (ref 13.0–17.0)
Hemoglobin: 12.9 g/dL — ABNORMAL LOW (ref 13.0–17.0)
MCH: 30 pg (ref 26.0–34.0)
MCH: 30.1 pg (ref 26.0–34.0)
MCHC: 32 g/dL (ref 30.0–36.0)
MCHC: 32.7 g/dL (ref 30.0–36.0)
MCV: 93.9 fL (ref 78.0–100.0)
MCV: 92.1 fL (ref 78.0–100.0)
PLATELETS: 188 10*3/uL (ref 150–400)
PLATELETS: 174 10*3/uL (ref 150–400)
RBC: 4.73 MIL/uL (ref 4.22–5.81)
RBC: 4.29 MIL/uL (ref 4.22–5.81)
RDW: 14.1 % (ref 11.5–15.5)
RDW: 14.1 % (ref 11.5–15.5)
WBC: 3.8 10*3/uL — AB (ref 4.0–10.5)
WBC: 5.1 10*3/uL (ref 4.0–10.5)

## 2018-02-12 LAB — BASIC METABOLIC PANEL
Anion gap: 10 (ref 5–15)
BUN: 9 mg/dL (ref 6–20)
CO2: 24 mmol/L (ref 22–32)
CREATININE: 0.91 mg/dL (ref 0.61–1.24)
Calcium: 9.3 mg/dL (ref 8.9–10.3)
Chloride: 106 mmol/L (ref 101–111)
Glucose, Bld: 108 mg/dL — ABNORMAL HIGH (ref 65–99)
Potassium: 4.1 mmol/L (ref 3.5–5.1)
SODIUM: 140 mmol/L (ref 135–145)

## 2018-02-12 LAB — TSH: TSH: 1.651 u[IU]/mL (ref 0.350–4.500)

## 2018-02-12 LAB — BRAIN NATRIURETIC PEPTIDE: B Natriuretic Peptide: 633.4 pg/mL — ABNORMAL HIGH (ref 0.0–100.0)

## 2018-02-12 LAB — CREATININE, SERUM: Creatinine, Ser: 0.89 mg/dL (ref 0.61–1.24)

## 2018-02-12 LAB — TROPONIN I
TROPONIN I: 0.1 ng/mL — AB (ref <0.03)
Troponin I: 0.13 ng/mL (ref <0.03)

## 2018-02-12 MED ORDER — HEPARIN SODIUM (PORCINE) 5000 UNIT/ML IJ SOLN
5000.0000 [IU] | Freq: Three times a day (TID) | INTRAMUSCULAR | Status: DC
  Administered 2018-02-13 – 2018-02-14 (×5): 5000 [IU] via SUBCUTANEOUS
  Filled 2018-02-12 (×5): qty 1

## 2018-02-12 MED ORDER — HYDROCHLOROTHIAZIDE 12.5 MG PO CAPS
12.5000 mg | ORAL_CAPSULE | Freq: Every day | ORAL | Status: DC
  Administered 2018-02-12 – 2018-02-13 (×2): 12.5 mg via ORAL
  Filled 2018-02-12 (×2): qty 1

## 2018-02-12 MED ORDER — IOPAMIDOL (ISOVUE-370) INJECTION 76%
INTRAVENOUS | Status: AC
  Administered 2018-02-12: 100 mL
  Filled 2018-02-12: qty 100

## 2018-02-12 MED ORDER — ASPIRIN EC 81 MG PO TBEC
81.0000 mg | DELAYED_RELEASE_TABLET | Freq: Every day | ORAL | Status: DC
  Administered 2018-02-13 – 2018-02-17 (×4): 81 mg via ORAL
  Filled 2018-02-12 (×4): qty 1

## 2018-02-12 MED ORDER — OXYCODONE-ACETAMINOPHEN 5-325 MG PO TABS: 2.0000 | ORAL_TABLET | Freq: Once | ORAL | Status: DC

## 2018-02-12 MED ORDER — ACETAMINOPHEN 325 MG PO TABS: 650.0000 mg | ORAL_TABLET | ORAL | Status: DC | PRN

## 2018-02-12 MED ORDER — METOPROLOL TARTRATE 12.5 MG HALF TABLET
12.5000 mg | ORAL_TABLET | Freq: Two times a day (BID) | ORAL | Status: DC
  Administered 2018-02-13 (×3): 12.5 mg via ORAL
  Filled 2018-02-12 (×3): qty 1

## 2018-02-12 MED ORDER — ONDANSETRON HCL 4 MG/2ML IJ SOLN: 4.0000 mg | Freq: Four times a day (QID) | INTRAMUSCULAR | Status: DC | PRN

## 2018-02-12 MED ORDER — NITROGLYCERIN 0.4 MG SL SUBL: 0.4000 mg | SUBLINGUAL_TABLET | SUBLINGUAL | Status: DC | PRN

## 2018-02-12 MED ORDER — ASPIRIN 81 MG PO CHEW
324.0000 mg | CHEWABLE_TABLET | Freq: Once | ORAL | Status: AC
  Administered 2018-02-12: 324 mg via ORAL
  Filled 2018-02-12: qty 4

## 2018-02-12 NOTE — H&P (Signed)
Cardiology Admission History and Physical:   Patient ID: Ricardo Brooks; MRN: [id number]; DOB: November 21, 1956   Admission date: 02/12/2018  Primary Care Provider: Gilda CreasePavelock, Richard M, MD Primary Cardiologist:New to Dr. Allyson SabalBerry  Chief Complaint:  SOB  Patient Profile:   Ricardo Brooks is a 62 y.o. male with a history of hypertension (currently not on any medications), bipolar disorder, ongoing tobacco abuse, chronic back and hip pain  who is being seen today for the evaluation of shortness of breath.  No prior cardiac history.  Currently smokes less than half a pack a day.  Denies illicit drug use.  History of Present Illness:   Ricardo Brooks was in usual state of health up until a few days ago when he has noted shortness of breath with activity.  He has chronic dyspnea on exertion due to ongoing tobacco smoking.  However noted worsening dyspnea with fatigue on ambulation.  He also noted intermittent palpitation with shortness of breath.  He is not taking any antihypertensives since last fall.  Says it was discontinued by PCP due to normalization of blood pressure.  He is noncompliant with his bipolar medication.  He has intermittent orthopnea without PND, dizziness, lower extremity edema or melena.  He says he walks a lot however unable to do so in past few days due to worsening shortness of breath.  In ER, BNP 633.  Point-of-care troponin is 0.16.  Troponin I 0.1.  Electrolyte and serum creatinine normal.  EKG shows sinus rhythm at rate of 87 bpm, bilateral atrial enlargement, LVH  and nonspecific T wave inversion in lateral leads-personally reviewed.  CT angio of chest: IMPRESSION: 1. No pulmonary emboli. 2. Cardiomegaly with small bilateral pleural effusions. 3. Nonspecific mediastinal adenopathy.   Past Medical History:  Diagnosis Date  . Asthma   . Bipolar 1 disorder (HCC)   . Chronic back pain   . Hip pain, chronic   . Hypertension   . PTSD (post-traumatic stress disorder)     Past  Surgical History:  Procedure Laterality Date  . orif right ankle Right 1985     Medications Prior to Admission: Prior to Admission medications   Medication Sig Start Date End Date Taking? Authorizing Provider  acetaminophen (TYLENOL) 500 MG tablet Take 1,000 mg by mouth every 6 (six) hours as needed for mild pain.    Yes [provider]  ARIPiprazole (ABILIFY) 15 MG tablet Take 7.5 mg by mouth daily.   Yes [provider]  Cholecalciferol (VITAMIN D PO) Take 1 tablet by mouth daily.   Yes [provider]  Cyanocobalamin (VITAMIN B-12 PO) Take 1 tablet by mouth daily.   Yes [provider]  ibuprofen (ADVIL,MOTRIN) 800 MG tablet Take 1 tablet (800 mg total) by mouth every 8 (eight) hours as needed. 05/05/17  Yes Long, Arlyss RepressJoshua G, MD  loratadine (CLARITIN) 10 MG tablet Take 10 mg by mouth daily as needed for allergies.  09/09/13  Yes Linwood DibblesKnapp, Jon, MD  naproxen (NAPROSYN) 500 MG tablet Take 1 tablet (500 mg total) by mouth 2 (two) times daily. 01/23/16  Yes Hedges, Tinnie GensJeffrey, PA-C  traMADol (ULTRAM) 50 MG tablet Take 1-2 tablets (50-100 mg total) by mouth every 6 (six) hours as needed. 10/01/17  Yes Kathryne HitchBlackman, Christopher Y, MD  azithromycin (ZITHROMAX) 250 MG tablet Take 1 tablet (250 mg total) by mouth daily. Take first 2 tablets together, then 1 every day until finished. Patient not taking: Reported on 02/12/2018 03/03/17   Deborha PaymentMeyer, Ashley L, PA-C  cyclobenzaprine [facility](FLEXERIL)  10 MG tablet Take 1 tablet (10 mg total) by mouth 2 (two) times daily as needed for muscle spasms. Patient not taking: Reported on 02/12/2018 04/22/16   Fayrene Helper, PA-C  fluticasone [facility]) 50 MCG/ACT nasal spray Place 2 sprays into both nostrils daily. Patient not taking: Reported on 02/12/2018 03/03/17   Deborha Payment, PA-C  HYDROcodone-acetaminophen (NORCO/VICODIN) 5-325 MG tablet Take 1 tablet by mouth every 4 (four) hours as needed. Patient not taking: Reported on 02/12/2018 08/17/17   Jacalyn Lefevre, MD  meclizine (ANTIVERT) 25 MG tablet Take 1 tablet (25 mg total) by mouth 3 (three) times daily as needed for dizziness. Patient not taking: Reported on 02/12/2018 08/13/14   [name], MD  ondansetron (ZOFRAN) 4 MG tablet Take 1 tablet (4 mg total) by mouth every 6 (six) hours. Patient not taking: Reported on 02/12/2018 07/09/14   [name], PA-C  predniSONE (DELTASONE) 50 MG tablet Take 1 tablet (50 mg total) by mouth daily. Patient not taking: Reported on 02/12/2018 06/29/16   Charlestine Night, PA-C     Allergies:    Allergies  Allergen Reactions  . Methocarbamol Nausea And Vomiting and Anxiety    Social History:   Social History   Socioeconomic History  . Marital status: Single    Spouse name: Not on file  . Number of children: Not on file  . Years of education: Not on file  . Highest education level: Not on file  Social Needs  . Financial resource strain: Not on file  . Food insecurity - worry: Not on file  . Food insecurity - inability: Not on file  . Transportation needs - medical: Not on file  . Transportation needs - non-medical: Not on file  Occupational History  . Not on file  Tobacco Use  . Smoking status: Current Every Day Smoker    Packs/day: 0.50    Types: Cigarettes  . Smokeless tobacco: Never Used  Substance and Sexual Activity  . Alcohol use: Yes  . Drug use: No  . Sexual activity: Not on file  Other Topics Concern  . Not on file  Social History Narrative  . Not on file    Family History:   The patient's family history is not on file.   Denies family hx of CAD  ROS:  Please see the history of present illness.  All other ROS reviewed and negative.     Physical Exam/Data:   Vitals:   02/12/18 1007 02/12/18 1028 02/12/18 1345  BP: (!) 130/98    Pulse: 81  95  Resp: 18  (!) 21  Temp: 97.7 F (36.5 C)    TempSrc: Oral    SpO2: 100%  94%  Weight:  142 lb (64.4 kg)   Height:  5' 9.5" (1.765 m)    No intake or output data in  the 24 hours ending 02/12/18 1502 Filed Weights   02/12/18 1028  Weight: 142 lb (64.4 kg)   Body mass index is 20.67 kg/m.  General:  Well nourished, well developed, in no acute distress HEENT: normal Lymph: no adenopathy Neck: + JVD Endocrine:  No thryomegaly Vascular: No carotid bruits; FA pulses 2+ bilaterally without bruits  Cardiac:  normal S1, S2; irregular, soft systolic murmur Lungs:  clear to auscultation bilaterally, no wheezing, rhonchi or rales  Abd: soft, nontender, no hepatomegaly  Ext: no  edema Musculoskeletal:  No deformities, BUE and BLE strength normal and equal Skin: warm and dry  Neuro:  CNs 2-12 intact,  no focal abnormalities noted Psych:  Normal affect    Telemetry: Sinus rhythm with occasional PVCs and PACs-personally reviewed  Relevant CV Studies: As above  Laboratory Data:  Chemistry Recent Labs  Lab 02/12/18 1032  NA 140  K 4.1  CL 106  CO2 24  GLUCOSE 108*  BUN 9  CREATININE 0.91  CALCIUM 9.3  GFRNONAA >60  GFRAA >60  ANIONGAP 10    No results for input(s): PROT, ALBUMIN, AST, ALT, ALKPHOS, BILITOT in the last 168 hours. Hematology Recent Labs  Lab 02/12/18 1032  WBC 3.8*  RBC 4.73  HGB 14.2  HCT 44.4  MCV 93.9  MCH 30.0  MCHC 32.0  RDW 14.1  PLT 188   Cardiac Enzymes Recent Labs  Lab 02/12/18 1113  TROPONINI 0.10*    Recent Labs  Lab 02/12/18 1052  TROPIPOC 0.16*    BNP Recent Labs  Lab 02/12/18 1114  BNP 633.4*    DDimer No results for input(s): DDIMER in the last 168 hours.  Radiology/Studies:  Dg Chest 2 View  Result Date: 02/12/2018 CLINICAL DATA:  Shortness of Breath EXAM: CHEST - 2 VIEW COMPARISON:  None. FINDINGS: There is mild scarring in the left lower lobe. There is no edema or consolidation. The heart size and pulmonary vascularity are normal. No adenopathy. No bone lesions. IMPRESSION: Mild scarring left lower lobe.  No edema or consolidation. Electronically Signed   By: Bretta Bang III  M.D.   On: 02/12/2018 10:44   Ct Angio Chest Pe W And/or Wo Contrast  Result Date: 02/12/2018 CLINICAL DATA:  Shortness of breath. EXAM: CT ANGIOGRAPHY CHEST WITH CONTRAST TECHNIQUE: Multidetector CT imaging of the chest was performed using the standard protocol during bolus administration of intravenous contrast. Multiplanar CT image reconstructions and MIPs were obtained to evaluate the vascular anatomy. CONTRAST:  ISOVUE-370 IOPAMIDOL (ISOVUE-370) INJECTION 76% COMPARISON:  Chest x-ray dated 02/12/2018 FINDINGS: Cardiovascular: Satisfactory opacification of the pulmonary arteries to the segmental level. No evidence of pulmonary embolism. Cardiomegaly. No pericardial effusion. Mediastinum/Nodes: Slightly prominent nonspecific mediastinal lymph nodes including a 14 mm node in the aortopulmonary window and a 14 mm precarinal lymph node. Trachea and esophagus demonstrate no significant findings. Lungs/Pleura: Emphysematous changes in the upper lobes. Small bilateral pleural effusions, right greater than left. Peribronchial thickening bilaterally. No infiltrates. Upper Abdomen: No acute abnormality. Musculoskeletal: No chest wall abnormality. No acute or significant osseous findings. Review of the MIP images confirms the above findings. IMPRESSION: 1. No pulmonary emboli. 2. Cardiomegaly with small bilateral pleural effusions. 3. Nonspecific mediastinal adenopathy. Electronically Signed   By: Francene Boyers M.D.   On: 02/12/2018 12:18    Assessment and Plan:   1. Dyspnea on exertion -Patient has chronic dyspnea on exertion due to ongoing tobacco smoking.  However, it worsened in past few days.  He also has intermittent orthopnea and palpitation. -BNP minimally elevated.  Troponin minimally elevated.  EKG with LVH criteria and nonspecific T wave inversion laterally. -CTA negative for PE. -Admit and cycle troponin.  Check TSH and echocardiogram.  2.  Elevated troponin -Patient denies any chest  pain.  EKG with nonspecific changes.  N.p.o. after midnight for stress test in AM.  3.  Hypertension -Taken off antihypertensive regimen by PCP last year due to normalization of blood pressure.  Patient has LVH criteria on EKG.  Elevated diastolic blood pressure.  Will start low-dose hydrochlorothiazide and metoprolol.   4.  Tobacco smoking -Encourage cessation.  Education given.  5.  Bipolar  disorder -Encourage compliance with medication.  6. Mediastinal adenopathy - Per admitting team    For questions or updates, please contact [facility] Please consult [email or web address] for contact info under Cardiology/STEMI.    Vonzella Nipple [place], [place]  02/12/2018 3:02 PM

## 2018-02-12 NOTE — ED Notes (Signed)
Pt made aware that should be getting a dinner tray and he cannot have anything to drink or eat after midnight tonight d/t a procedure in am.  Plan of care discussed with pt.

## 2018-02-12 NOTE — ED Triage Notes (Addendum)
Pt c/o shortness of breath started yesterday--lungs diminshed-- pt states that he has not taken his tramadol this am for chronic pain due to reading online that it may cause shortnes of breath

## 2018-02-12 NOTE — Consult Note (Addendum)
  See H&P for admission

## 2018-02-12 NOTE — ED Notes (Signed)
Pt care assumed, obtained verbal report.  Pt is resting and appears comfortable.  Medicated as ordered.  Pt denies any cp but reports some SOB.  Pt is A&O x 4.  In NAD.  Waiting for CT angio.

## 2018-02-12 NOTE — ED Notes (Signed)
I-stat TROPONIN RESULTS GIVEN TO Corlis LeakMackuen, MD + Danae OrleansNikki Simmons, RN

## 2018-02-12 NOTE — ED Notes (Signed)
Dr. Allyson SabalBerry paged ref. Troponin results

## 2018-02-12 NOTE — ED Notes (Signed)
Admitting MD made aware of Troponin results, no new orders recieved

## 2018-02-12 NOTE — ED Provider Notes (Signed)
[facility] EMERGENCY DEPARTMENT Provider Note   CSN: [id number] Arrival date & time: 02/12/18  4098     History   Chief Complaint Chief Complaint  Patient presents with  . Shortness of Breath    HPI Ricardo Brooks is a 62 y.o. male.  HPI   62 year old male with history of chronic back pain and chronic hip pain with bipolar disorder and hypertension presents today with shortness of breath.  Patient noticed yesterday that he all of a sudden had shortness of breath walking or exerting himself.  Patient has noticed no prolonged immobility, no recent surgeries or travel.  No swelling in his legs.  Patient has not noticed any orthopnea at night.   Patient noticed to have positive troponin in the waiting room.  Past Medical History:  Diagnosis Date  . Asthma   . Bipolar 1 disorder (HCC)   . Chronic back pain   . Hip pain, chronic   . Hypertension   . PTSD (post-traumatic stress disorder)     Patient Active Problem List   Diagnosis Date Noted  . Primary osteoarthritis of right hip 06/26/2017  . Primary osteoarthritis of left hip 06/26/2017    Past Surgical History:  Procedure Laterality Date  . orif right ankle Right 1985       Home Medications    Prior to Admission medications   Medication Sig Start Date End Date Taking? Authorizing Provider  acetaminophen (TYLENOL) 500 MG tablet Take 1,000 mg by mouth every 6 (six) hours as needed for mild pain.     [provider]  ARIPiprazole (ABILIFY) 15 MG tablet Take 7.5 mg by mouth daily.    [provider]  azithromycin (ZITHROMAX) 250 MG tablet Take 1 tablet (250 mg total) by mouth daily. Take first 2 tablets together, then 1 every day until finished. 03/03/17   Deborha Payment, PA-C  Cholecalciferol (VITAMIN D PO) Take 1 tablet by mouth daily.    [provider]  Cyanocobalamin (VITAMIN B-12 PO) Take 1 tablet by mouth daily.    [provider]  cyclobenzaprine  (FLEXERIL) 10 MG tablet Take 1 tablet (10 mg total) by mouth 2 (two) times daily as needed for muscle spasms. 04/22/16   Fayrene Helper, PA-C  fluticasone (FLONASE) 50 MCG/ACT nasal spray Place 2 sprays into both nostrils daily. 03/03/17   Deborha Payment, PA-C  HYDROcodone-acetaminophen (NORCO/VICODIN) 5-325 MG tablet Take 1 tablet by mouth every 4 (four) hours as needed. 08/17/17   Jacalyn Lefevre, MD  ibuprofen (ADVIL,MOTRIN) 800 MG tablet Take 1 tablet (800 mg total) by mouth every 8 (eight) hours as needed. 05/05/17   Long, Arlyss Repress, MD  loratadine (CLARITIN) 10 MG tablet Take 10 mg by mouth daily as needed for allergies.  09/09/13   [name], MD  meclizine (ANTIVERT) 25 MG tablet Take 1 tablet (25 mg total) by mouth 3 (three) times daily as needed for dizziness. 08/13/14   [name], MD  naproxen (NAPROSYN) 500 MG tablet Take 1 tablet (500 mg total) by mouth 2 (two) times daily. 01/23/16   Hedges, Tinnie Gens, PA-C  ondansetron (ZOFRAN) 4 MG tablet Take 1 tablet (4 mg total) by mouth every 6 (six) hours. 07/09/14   [name], PA-C  predniSONE (DELTASONE) 50 MG tablet Take 1 tablet (50 mg total) by mouth daily. 06/29/16   Lawyer, Cristal Deer, PA-C  traMADol (ULTRAM) 50 MG tablet Take 1-2 tablets (50-100 mg total) by mouth every 6 (six) hours as needed. 10/01/17  Kathryne Hitch, MD    Family History No family history on file.  Social History Social History   Tobacco Use  . Smoking status: Current Every Day Smoker    Packs/day: 0.50    Types: Cigarettes  . Smokeless tobacco: Never Used  Substance Use Topics  . Alcohol use: Yes  . Drug use: No     Allergies   Methocarbamol   Review of Systems Review of Systems  Constitutional: Negative for activity change, fatigue and fever.  Respiratory: Positive for shortness of breath.   Cardiovascular: Negative for chest pain.  Gastrointestinal: Negative for abdominal pain.  All other systems reviewed and are negative.    Physical  Exam Updated Vital Signs BP (!) 130/98   Pulse 81   Temp 97.7 F (36.5 C) (Oral)   Resp 18   Ht 5' 9.5" (1.765 m)   Wt 64.4 kg (142 lb)   SpO2 100%   BMI 20.67 kg/m   Physical Exam  Constitutional: He is oriented to person, place, and time. He appears well-nourished.  HENT:  Head: Normocephalic.  Eyes: Conjunctivae and EOM are normal. Pupils are equal, round, and reactive to light.  Neck: Normal range of motion.  Cardiovascular: Normal rate and regular rhythm.  Pulmonary/Chest: Effort normal and breath sounds normal. No accessory muscle usage. No tachypnea. No respiratory distress. He has no decreased breath sounds.  Neurological: He is oriented to person, place, and time.  Skin: Skin is warm and dry. He is not diaphoretic.  Psychiatric: He has a normal mood and affect. His behavior is normal.     ED Treatments / Results  Labs (all labs ordered are listed, but only abnormal results are displayed) Labs Reviewed  CBC - Abnormal; Notable for the following components:      Result Value   WBC 3.8 (*)    All other components within normal limits  BASIC METABOLIC PANEL  TROPONIN I  BRAIN NATRIURETIC PEPTIDE  I-STAT TROPONIN, ED    EKG  EKG Interpretation  Date/Time:  Tuesday February 12 2018 11:12:34 EDT Ventricular Rate:  87 PR Interval:  146 QRS Duration: 92 QT Interval:  383 QTC Calculation: 461 R Axis:   -131 Text Interpretation:  Sinus rhythm Biatrial enlargement Borderline T abnormalities, lateral leads similar to prior Confirmed by Bary Castilla ([id number]) on 02/12/2018 11:17:25 AM       Radiology Dg Chest 2 View  Result Date: 02/12/2018 CLINICAL DATA:  Shortness of Breath EXAM: CHEST - 2 VIEW COMPARISON:  None. FINDINGS: There is mild scarring in the left lower lobe. There is no edema or consolidation. The heart size and pulmonary vascularity are normal. No adenopathy. No bone lesions. IMPRESSION: Mild scarring left lower lobe.  No edema or consolidation.  Electronically Signed   By: Bretta Bang III M.D.   On: 02/12/2018 10:44    Procedures Procedures (including critical care time)  Medications Ordered in ED Medications - No data to display   Initial Impression / Assessment and Plan / ED Course  I have reviewed the triage vital signs and the nursing notes.  Pertinent labs & imaging results that were available during my care of the patient were reviewed by me and considered in my medical decision making (see chart for details).     62 year old male with history of chronic back pain and chronic hip pain with bipolar disorder and hypertension presents today with shortness of breath.  Patient noticed yesterday that he all of a sudden had shortness  of breath walking or exerting himself.  Patient has noticed no prolonged immobility, no recent surgeries or travel.  No swelling in his legs.  Patient has not noticed any orthopnea at night.   Patient noticed to have positive troponin in the waiting room.  11:19 AM Patient appears very well.  I cannot explain patient the patient's positive troponin.  Patient denies any chest pain at all.  Given his acute onset of shortness of breath, will get CT angios because of no other explanation.  Patient has clear lungs.  Additional positive troponin.  CT angio negative.  Will discuss with cardiology.   Final Clinical Impressions(s) / ED Diagnoses   Final diagnoses:  None    ED Discharge Orders    None       Raney Antwine, Cindee Saltourteney Lyn, MD 02/12/18 1424

## 2018-02-12 NOTE — ED Notes (Signed)
Patient c/o sob denies chest pain states this started yest. States he just doesn't have any energy.

## 2018-02-12 NOTE — ED Notes (Signed)
Paged cards fellow/Harris to [place], [place] @ [phone]

## 2018-02-13 ENCOUNTER — Observation Stay (HOSPITAL_COMMUNITY): Payer: Medicaid Other

## 2018-02-13 DIAGNOSIS — Z720 Tobacco use: Secondary | ICD-10-CM | POA: Diagnosis not present

## 2018-02-13 DIAGNOSIS — I340 Nonrheumatic mitral (valve) insufficiency: Secondary | ICD-10-CM | POA: Diagnosis not present

## 2018-02-13 DIAGNOSIS — I2582 Chronic total occlusion of coronary artery: Secondary | ICD-10-CM | POA: Diagnosis present

## 2018-02-13 DIAGNOSIS — I5021 Acute systolic (congestive) heart failure: Secondary | ICD-10-CM | POA: Diagnosis present

## 2018-02-13 DIAGNOSIS — I110 Hypertensive heart disease with heart failure: Secondary | ICD-10-CM | POA: Diagnosis present

## 2018-02-13 DIAGNOSIS — I519 Heart disease, unspecified: Secondary | ICD-10-CM

## 2018-02-13 DIAGNOSIS — I5023 Acute on chronic systolic (congestive) heart failure: Secondary | ICD-10-CM | POA: Diagnosis not present

## 2018-02-13 DIAGNOSIS — I252 Old myocardial infarction: Secondary | ICD-10-CM | POA: Diagnosis not present

## 2018-02-13 DIAGNOSIS — I4892 Unspecified atrial flutter: Secondary | ICD-10-CM | POA: Diagnosis not present

## 2018-02-13 DIAGNOSIS — M549 Dorsalgia, unspecified: Secondary | ICD-10-CM | POA: Diagnosis present

## 2018-02-13 DIAGNOSIS — E785 Hyperlipidemia, unspecified: Secondary | ICD-10-CM | POA: Diagnosis present

## 2018-02-13 DIAGNOSIS — F4310 Post-traumatic stress disorder, unspecified: Secondary | ICD-10-CM | POA: Diagnosis present

## 2018-02-13 DIAGNOSIS — M160 Bilateral primary osteoarthritis of hip: Secondary | ICD-10-CM | POA: Diagnosis present

## 2018-02-13 DIAGNOSIS — R079 Chest pain, unspecified: Secondary | ICD-10-CM

## 2018-02-13 DIAGNOSIS — F319 Bipolar disorder, unspecified: Secondary | ICD-10-CM | POA: Diagnosis present

## 2018-02-13 DIAGNOSIS — I513 Intracardiac thrombosis, not elsewhere classified: Secondary | ICD-10-CM | POA: Diagnosis present

## 2018-02-13 DIAGNOSIS — I272 Pulmonary hypertension, unspecified: Secondary | ICD-10-CM | POA: Diagnosis present

## 2018-02-13 DIAGNOSIS — G8929 Other chronic pain: Secondary | ICD-10-CM | POA: Diagnosis present

## 2018-02-13 DIAGNOSIS — R0609 Other forms of dyspnea: Secondary | ICD-10-CM | POA: Diagnosis not present

## 2018-02-13 DIAGNOSIS — R7989 Other specified abnormal findings of blood chemistry: Secondary | ICD-10-CM | POA: Diagnosis present

## 2018-02-13 DIAGNOSIS — Z888 Allergy status to other drugs, medicaments and biological substances status: Secondary | ICD-10-CM | POA: Diagnosis not present

## 2018-02-13 DIAGNOSIS — I2510 Atherosclerotic heart disease of native coronary artery without angina pectoris: Secondary | ICD-10-CM | POA: Diagnosis present

## 2018-02-13 DIAGNOSIS — Z79899 Other long term (current) drug therapy: Secondary | ICD-10-CM | POA: Diagnosis not present

## 2018-02-13 DIAGNOSIS — Z9114 Patient's other noncompliance with medication regimen: Secondary | ICD-10-CM | POA: Diagnosis not present

## 2018-02-13 DIAGNOSIS — R748 Abnormal levels of other serum enzymes: Secondary | ICD-10-CM | POA: Diagnosis not present

## 2018-02-13 DIAGNOSIS — R590 Localized enlarged lymph nodes: Secondary | ICD-10-CM | POA: Diagnosis present

## 2018-02-13 DIAGNOSIS — F17210 Nicotine dependence, cigarettes, uncomplicated: Secondary | ICD-10-CM | POA: Diagnosis present

## 2018-02-13 DIAGNOSIS — I255 Ischemic cardiomyopathy: Secondary | ICD-10-CM | POA: Diagnosis present

## 2018-02-13 LAB — LIPID PANEL
CHOL/HDL RATIO: 4.8 ratio
CHOLESTEROL: 179 mg/dL (ref 0–200)
HDL: 37 mg/dL — ABNORMAL LOW (ref >40)
LDL CALC: 118 mg/dL — AB (ref 0–99)
Triglycerides: 120 mg/dL (ref <150)
VLDL: 24 mg/dL (ref 0–40)

## 2018-02-13 LAB — BASIC METABOLIC PANEL
Anion gap: 10 (ref 5–15)
BUN: 11 mg/dL (ref 6–20)
CHLORIDE: 105 mmol/L (ref 101–111)
CO2: 23 mmol/L (ref 22–32)
Calcium: 8.8 mg/dL — ABNORMAL LOW (ref 8.9–10.3)
Creatinine, Ser: 0.97 mg/dL (ref 0.61–1.24)
GFR calc Af Amer: >60 mL/min (ref >60)
GFR calc non Af Amer: >60 mL/min (ref >60)
GLUCOSE: 96 mg/dL (ref 65–99)
Potassium: 4 mmol/L (ref 3.5–5.1)
SODIUM: 138 mmol/L (ref 135–145)

## 2018-02-13 LAB — HIV ANTIBODY (ROUTINE TESTING W REFLEX): HIV Screen 4th Generation wRfx: NONREACTIVE

## 2018-02-13 LAB — TROPONIN I
Troponin I: 0.1 ng/mL (ref <0.03)
Troponin I: 0.1 ng/mL (ref <0.03)

## 2018-02-13 LAB — CBC
HCT: 40.1 % (ref 39.0–52.0)
HEMOGLOBIN: 13 g/dL (ref 13.0–17.0)
MCH: 30.2 pg (ref 26.0–34.0)
MCHC: 32.4 g/dL (ref 30.0–36.0)
MCV: 93 fL (ref 78.0–100.0)
PLATELETS: 185 10*3/uL (ref 150–400)
RBC: 4.31 MIL/uL (ref 4.22–5.81)
RDW: 14.2 % (ref 11.5–15.5)
WBC: 4 10*3/uL (ref 4.0–10.5)

## 2018-02-13 LAB — NM MYOCAR MULTI W/SPECT W/WALL MOTION / EF
CHL CUP RESTING HR STRESS: 72 {beats}/min
Peak HR: 92 {beats}/min

## 2018-02-13 LAB — HEMOGLOBIN A1C
HEMOGLOBIN A1C: 5.7 % — AB (ref 4.8–5.6)
MEAN PLASMA GLUCOSE: 116.89 mg/dL

## 2018-02-13 MED ORDER — SODIUM CHLORIDE 0.9 % IV SOLN
INTRAVENOUS | Status: DC
  Administered 2018-02-14: 06:00:00 via INTRAVENOUS

## 2018-02-13 MED ORDER — SODIUM CHLORIDE 0.9% FLUSH: 3.0000 mL | INTRAVENOUS | Status: DC | PRN

## 2018-02-13 MED ORDER — SODIUM CHLORIDE 0.9 % IV SOLN: 250.0000 mL | INTRAVENOUS | Status: DC | PRN

## 2018-02-13 MED ORDER — TECHNETIUM TC 99M TETROFOSMIN IV KIT
30.0000 | PACK | Freq: Once | INTRAVENOUS | Status: AC | PRN
  Administered 2018-02-13: 30 via INTRAVENOUS

## 2018-02-13 MED ORDER — SODIUM CHLORIDE 0.9% FLUSH
3.0000 mL | Freq: Two times a day (BID) | INTRAVENOUS | Status: DC
  Administered 2018-02-13: 3 mL via INTRAVENOUS

## 2018-02-13 MED ORDER — ASPIRIN 81 MG PO CHEW
81.0000 mg | CHEWABLE_TABLET | ORAL | Status: AC
  Administered 2018-02-14: 81 mg via ORAL
  Filled 2018-02-13: qty 1

## 2018-02-13 MED ORDER — TECHNETIUM TC 99M TETROFOSMIN IV KIT
10.0000 | PACK | Freq: Once | INTRAVENOUS | Status: AC | PRN
  Administered 2018-02-13: 10 via INTRAVENOUS

## 2018-02-13 MED ORDER — REGADENOSON 0.4 MG/5ML IV SOLN
0.4000 mg | Freq: Once | INTRAVENOUS | Status: AC
  Administered 2018-02-13: 0.4 mg via INTRAVENOUS
  Filled 2018-02-13: qty 5

## 2018-02-13 MED ORDER — REGADENOSON 0.4 MG/5ML IV SOLN
INTRAVENOUS | Status: AC
  Administered 2018-02-13: 0.4 mg via INTRAVENOUS
  Filled 2018-02-13: qty 5

## 2018-02-13 NOTE — Progress Notes (Signed)
   Sydnee Cabalavid Edelen presented for a nuclear stress test today.  No immediate complications.  Stress imaging is pending at this time.  Preliminary EKG findings may be listed in the chart, but the stress test result will not be finalized until perfusion imaging is complete.  1 day study, [facility] to read.  Beatriz StallionKrista M. Winson Eichorn, PA-C 02/13/2018, 11:54 AM

## 2018-02-13 NOTE — Progress Notes (Addendum)
Progress Note  Patient Name: Ricardo Brooks Date of Encounter: 02/13/2018  Primary Cardiologist: New to [facility], Dr. Allyson Sabal  Subjective   Feels his breathing is improved this AM. Ambulated around his room without DOE. Denies chest pain or palpitations  Inpatient Medications    Scheduled Meds: . aspirin EC  81 mg Oral Daily  . heparin  5,000 Units Subcutaneous Q8H  . hydrochlorothiazide  12.5 mg Oral Daily  . metoprolol tartrate  12.5 mg Oral BID  . regadenoson      . regadenoson  0.4 mg Intravenous Once   Continuous Infusions:  PRN Meds: acetaminophen, nitroGLYCERIN, ondansetron (ZOFRAN) IV   Vital Signs    Vitals:   02/12/18 2322 02/13/18 0500 02/13/18 0603 02/13/18 1129  BP: 128/89  117/75 121/90  Pulse: 96  76 74  Resp:      Temp: 97.7 F (36.5 C)  97.8 F (36.6 C)   TempSrc: Oral  Oral   SpO2: 96%  100%   Weight: 133 lb 9.6 oz (60.6 kg) 132 lb 7.9 oz (60.1 kg)    Height: 5' 9.5" (1.765 m)       Intake/Output Summary (Last 24 hours) at 02/13/2018 1134 Last data filed at 02/12/2018 2016 Gross per 24 hour  Intake -  Output 650 ml  Net -650 ml   Filed Weights   02/12/18 1028 02/12/18 2322 02/13/18 0500  Weight: 142 lb (64.4 kg) 133 lb 9.6 oz (60.6 kg) 132 lb 7.9 oz (60.1 kg)    Telemetry    NSR - Personally Reviewed  ECG    NSR, LVH criteria - Personally Reviewed  Physical Exam   GEN: Laying in bed in no acute distress.   Neck: No JVD, no carotid bruits Cardiac: RRR, no murmurs, rubs, or gallops.  Respiratory: Clear to auscultation bilaterally, no wheezes/ rales/ rhonchi GI: NABS, Soft, nontender, non-distended  MS: No edema; No deformity. Neuro:  Nonfocal, moving all extremities spontaneously Psych: Normal affect   Labs    Chemistry Recent Labs  Lab 02/12/18 1032 02/12/18 1928 02/13/18 0615  NA 140  --  138  K 4.1  --  4.0  CL 106  --  105  CO2 24  --  23  GLUCOSE 108*  --  96  BUN 9  --  11  CREATININE 0.91 0.89 0.97    CALCIUM 9.3  --  8.8*  GFRNONAA >60 >60 >60  GFRAA >60 >60 >60  ANIONGAP 10  --  10     Hematology Recent Labs  Lab 02/12/18 1032 02/12/18 1928 02/13/18 0615  WBC 3.8* 5.1 4.0  RBC 4.73 4.29 4.31  HGB 14.2 12.9* 13.0  HCT 44.4 39.5 40.1  MCV 93.9 92.1 93.0  MCH 30.0 30.1 30.2  MCHC 32.0 32.7 32.4  RDW 14.1 14.1 14.2  PLT 188 174 185    Cardiac Enzymes Recent Labs  Lab 02/12/18 1113 02/12/18 1928 02/13/18 0012 02/13/18 0615  TROPONINI 0.10* 0.13* 0.10* 0.10*    Recent Labs  Lab 02/12/18 1052  TROPIPOC 0.16*     BNP Recent Labs  Lab 02/12/18 1114  BNP 633.4*     DDimer No results for input(s): DDIMER in the last 168 hours.   Radiology    Dg Chest 2 View  Result Date: 02/12/2018 CLINICAL DATA:  Shortness of Breath EXAM: CHEST - 2 VIEW COMPARISON:  None. FINDINGS: There is mild scarring in the left lower lobe. There is no edema or consolidation. The heart size  and pulmonary vascularity are normal. No adenopathy. No bone lesions. IMPRESSION: Mild scarring left lower lobe.  No edema or consolidation. Electronically Signed   By: Bretta BangWilliam  Woodruff III M.D.   On: 02/12/2018 10:44   Ct Angio Chest Pe W And/or Wo Contrast  Result Date: 02/12/2018 CLINICAL DATA:  Shortness of breath. EXAM: CT ANGIOGRAPHY CHEST WITH CONTRAST TECHNIQUE: Multidetector CT imaging of the chest was performed using the standard protocol during bolus administration of intravenous contrast. Multiplanar CT image reconstructions and MIPs were obtained to evaluate the vascular anatomy. CONTRAST:  100mL ISOVUE-370 IOPAMIDOL (ISOVUE-370) INJECTION 76% COMPARISON:  Chest x-ray dated 02/12/2018 FINDINGS: Cardiovascular: Satisfactory opacification of the pulmonary arteries to the segmental level. No evidence of pulmonary embolism. Cardiomegaly. No pericardial effusion. Mediastinum/Nodes: Slightly prominent nonspecific mediastinal lymph nodes including a 14 mm node in the aortopulmonary window and a 14 mm  precarinal lymph node. Trachea and esophagus demonstrate no significant findings. Lungs/Pleura: Emphysematous changes in the upper lobes. Small bilateral pleural effusions, right greater than left. Peribronchial thickening bilaterally. No infiltrates. Upper Abdomen: No acute abnormality. Musculoskeletal: No chest wall abnormality. No acute or significant osseous findings. Review of the MIP images confirms the above findings. IMPRESSION: 1. No pulmonary emboli. 2. Cardiomegaly with small bilateral pleural effusions. 3. Nonspecific mediastinal adenopathy. Electronically Signed   By: Francene BoyersJames  Maxwell M.D.   On: 02/12/2018 12:18    Cardiac Studies   Nuclear stress test and echocardiogram pending  Patient Profile     62 y.o. male with a history of hypertension (currently not on any medications), bipolar disorder, ongoing tobacco abuse, chronic back and hip pain  who is being seen today for the evaluation of shortness of breath.  Assessment & Plan    1. DOE: patient presented with chronic DOE which has worsened over the past few days. Also with intermittent orthopnea and palpitations. CTA negative for PE but revealed b/l pleural effusions. BNP elevated to 600s. Troponin trend 0.1>0.13>0.1. EKG with LVH criteria and non-specific T wave inversion laterally. Likely chronic DOE contributed to by ongoing tobacco abuse, however possible new heart failure. - Started on HCTZ and metoprolol - Echocardiogram pending  2. Elevated troponin: Troponin trend 0.1>0.13>0.1. EKG with LVH criteria and non-specific T wave inversion laterally. Patient is without chest pain complaints - NST pending   3. HTN: taken off antihypertensives by PCP last year for normalization of BP. EKG with LVH criteria. Presented with elevated DBP. Improved this AM. - Started on HCTZ and metoprolol  4. Tobacco abuse: - Encourage cessation  5. Bipolar disorder:  - Continue current medications      For questions or updates, please  contact [facility] Please consult [email or web address] for contact info under Cardiology/STEMI.      Signed, Beatriz StallionKrista M. Kroeger, PA-C  02/13/2018, 11:34 AM   [phone]  Agree with note by Judy PimpleKrista Kroeger PA-C  MV shows large antero apical scar with severe LV dysfunction (EF 19%). Will need R/L heart cath tomorrow to define his anatomy and physiology. The patient understands that risks included but are not limited to stroke (1 in 1000), death (1 in 1000), kidney failure [usually temporary] (1 in 500), bleeding (1 in 200), allergic reaction [possibly serious] (1 in 200). The patient understands and agrees to proceed  Runell GessJonathan J. Cassity Christian, M.D., FACP, [facility]westFACC, Kathryne ErikssonFAHA, FSCAI [facility] [address] [place], [place]  [address]  [phone] 02/13/2018 3:18 PM

## 2018-02-13 NOTE — H&P (View-Only) (Signed)
Progress Note  Patient Name: Ricardo Brooks Date of Encounter: 02/13/2018  Primary Cardiologist: New to [facility], Dr. Allyson Sabal  Subjective   Feels his breathing is improved this AM. Ambulated around his room without DOE. Denies chest pain or palpitations  Inpatient Medications    Scheduled Meds: . aspirin EC  81 mg Oral Daily  . heparin  5,000 Units Subcutaneous Q8H  . hydrochlorothiazide  12.5 mg Oral Daily  . metoprolol tartrate  12.5 mg Oral BID  . regadenoson      . regadenoson  0.4 mg Intravenous Once   Continuous Infusions:  PRN Meds: acetaminophen, nitroGLYCERIN, ondansetron (ZOFRAN) IV   Vital Signs    Vitals:   02/12/18 2322 02/13/18 0500 02/13/18 0603 02/13/18 1129  BP: 128/89  117/75 121/90  Pulse: 96  76 74  Resp:      Temp: 97.7 F (36.5 C)  97.8 F (36.6 C)   TempSrc: Oral  Oral   SpO2: 96%  100%   Weight: 133 lb 9.6 oz (60.6 kg) 132 lb 7.9 oz (60.1 kg)    Height: 5' 9.5" (1.765 m)       Intake/Output Summary (Last 24 hours) at 02/13/2018 1134 Last data filed at 02/12/2018 2016 Gross per 24 hour  Intake -  Output 650 ml  Net -650 ml   Filed Weights   02/12/18 1028 02/12/18 2322 02/13/18 0500  Weight: 142 lb (64.4 kg) 133 lb 9.6 oz (60.6 kg) 132 lb 7.9 oz (60.1 kg)    Telemetry    NSR - Personally Reviewed  ECG    NSR, LVH criteria - Personally Reviewed  Physical Exam   GEN: Laying in bed in no acute distress.   Neck: No JVD, no carotid bruits Cardiac: RRR, no murmurs, rubs, or gallops.  Respiratory: Clear to auscultation bilaterally, no wheezes/ rales/ rhonchi GI: NABS, Soft, nontender, non-distended  MS: No edema; No deformity. Neuro:  Nonfocal, moving all extremities spontaneously Psych: Normal affect   Labs    Chemistry Recent Labs  Lab 02/12/18 1032 02/12/18 1928 02/13/18 0615  NA 140  --  138  K 4.1  --  4.0  CL 106  --  105  CO2 24  --  23  GLUCOSE 108*  --  96  BUN 9  --  11  CREATININE 0.91 0.89 0.97    CALCIUM 9.3  --  8.8*  GFRNONAA >60 >60 >60  GFRAA >60 >60 >60  ANIONGAP 10  --  10     Hematology Recent Labs  Lab 02/12/18 1032 02/12/18 1928 02/13/18 0615  WBC 3.8* 5.1 4.0  RBC 4.73 4.29 4.31  HGB 14.2 12.9* 13.0  HCT 44.4 39.5 40.1  MCV 93.9 92.1 93.0  MCH 30.0 30.1 30.2  MCHC 32.0 32.7 32.4  RDW 14.1 14.1 14.2  PLT 188 174 185    Cardiac Enzymes Recent Labs  Lab 02/12/18 1113 02/12/18 1928 02/13/18 0012 02/13/18 0615  TROPONINI 0.10* 0.13* 0.10* 0.10*    Recent Labs  Lab 02/12/18 1052  TROPIPOC 0.16*     BNP Recent Labs  Lab 02/12/18 1114  BNP 633.4*     DDimer No results for input(s): DDIMER in the last 168 hours.   Radiology    Dg Chest 2 View  Result Date: 02/12/2018 CLINICAL DATA:  Shortness of Breath EXAM: CHEST - 2 VIEW COMPARISON:  None. FINDINGS: There is mild scarring in the left lower lobe. There is no edema or consolidation. The heart size  and pulmonary vascularity are normal. No adenopathy. No bone lesions. IMPRESSION: Mild scarring left lower lobe.  No edema or consolidation. Electronically Signed   By: Bretta BangWilliam  Woodruff III M.D.   On: 02/12/2018 10:44   Ct Angio Chest Pe W And/or Wo Contrast  Result Date: 02/12/2018 CLINICAL DATA:  Shortness of breath. EXAM: CT ANGIOGRAPHY CHEST WITH CONTRAST TECHNIQUE: Multidetector CT imaging of the chest was performed using the standard protocol during bolus administration of intravenous contrast. Multiplanar CT image reconstructions and MIPs were obtained to evaluate the vascular anatomy. CONTRAST:  100mL ISOVUE-370 IOPAMIDOL (ISOVUE-370) INJECTION 76% COMPARISON:  Chest x-ray dated 02/12/2018 FINDINGS: Cardiovascular: Satisfactory opacification of the pulmonary arteries to the segmental level. No evidence of pulmonary embolism. Cardiomegaly. No pericardial effusion. Mediastinum/Nodes: Slightly prominent nonspecific mediastinal lymph nodes including a 14 mm node in the aortopulmonary window and a 14 mm  precarinal lymph node. Trachea and esophagus demonstrate no significant findings. Lungs/Pleura: Emphysematous changes in the upper lobes. Small bilateral pleural effusions, right greater than left. Peribronchial thickening bilaterally. No infiltrates. Upper Abdomen: No acute abnormality. Musculoskeletal: No chest wall abnormality. No acute or significant osseous findings. Review of the MIP images confirms the above findings. IMPRESSION: 1. No pulmonary emboli. 2. Cardiomegaly with small bilateral pleural effusions. 3. Nonspecific mediastinal adenopathy. Electronically Signed   By: Francene BoyersJames  Maxwell M.D.   On: 02/12/2018 12:18    Cardiac Studies   Nuclear stress test and echocardiogram pending  Patient Profile     62 y.o. male with a history of hypertension (currently not on any medications), bipolar disorder, ongoing tobacco abuse, chronic back and hip pain  who is being seen today for the evaluation of shortness of breath.  Assessment & Plan    1. DOE: patient presented with chronic DOE which has worsened over the past few days. Also with intermittent orthopnea and palpitations. CTA negative for PE but revealed b/l pleural effusions. BNP elevated to 600s. Troponin trend 0.1>0.13>0.1. EKG with LVH criteria and non-specific T wave inversion laterally. Likely chronic DOE contributed to by ongoing tobacco abuse, however possible new heart failure. - Started on HCTZ and metoprolol - Echocardiogram pending  2. Elevated troponin: Troponin trend 0.1>0.13>0.1. EKG with LVH criteria and non-specific T wave inversion laterally. Patient is without chest pain complaints - NST pending   3. HTN: taken off antihypertensives by PCP last year for normalization of BP. EKG with LVH criteria. Presented with elevated DBP. Improved this AM. - Started on HCTZ and metoprolol  4. Tobacco abuse: - Encourage cessation  5. Bipolar disorder:  - Continue current medications      For questions or updates, please  contact [facility] Please consult [email or web address] for contact info under Cardiology/STEMI.      Signed, Beatriz StallionKrista M. Alicia Ackert, PA-C  02/13/2018, 11:34 AM   [phone]  Agree with note by Judy PimpleKrista Keyauna Graefe PA-C  MV shows large antero apical scar with severe LV dysfunction (EF 19%). Will need R/L heart cath tomorrow to define his anatomy and physiology. The patient understands that risks included but are not limited to stroke (1 in 1000), death (1 in 1000), kidney failure [usually temporary] (1 in 500), bleeding (1 in 200), allergic reaction [possibly serious] (1 in 200). The patient understands and agrees to proceed  Runell GessJonathan J. Berry, M.D., FACP, [facility]westFACC, Kathryne ErikssonFAHA, FSCAI [facility] [address] [place], [place]  [address]  [phone] 02/13/2018 3:18 PM

## 2018-02-14 ENCOUNTER — Encounter (HOSPITAL_COMMUNITY)
Admission: EM | Disposition: A | Discharge status: Home / Self Care | Payer: Self-pay | Source: Home / Self Care | Attending: Cardiovascular Disease

## 2018-02-14 ENCOUNTER — Encounter (HOSPITAL_COMMUNITY): Payer: Self-pay | Admitting: Internal Medicine

## 2018-02-14 ENCOUNTER — Inpatient Hospital Stay (HOSPITAL_COMMUNITY): Payer: Medicaid Other

## 2018-02-14 DIAGNOSIS — R778 Other specified abnormalities of plasma proteins: Secondary | ICD-10-CM

## 2018-02-14 DIAGNOSIS — I5021 Acute systolic (congestive) heart failure: Secondary | ICD-10-CM

## 2018-02-14 DIAGNOSIS — I255 Ischemic cardiomyopathy: Secondary | ICD-10-CM

## 2018-02-14 DIAGNOSIS — I340 Nonrheumatic mitral (valve) insufficiency: Secondary | ICD-10-CM

## 2018-02-14 DIAGNOSIS — I2510 Atherosclerotic heart disease of native coronary artery without angina pectoris: Secondary | ICD-10-CM

## 2018-02-14 DIAGNOSIS — R7989 Other specified abnormal findings of blood chemistry: Secondary | ICD-10-CM

## 2018-02-14 DIAGNOSIS — E785 Hyperlipidemia, unspecified: Secondary | ICD-10-CM

## 2018-02-14 DIAGNOSIS — R748 Abnormal levels of other serum enzymes: Secondary | ICD-10-CM

## 2018-02-14 DIAGNOSIS — I5023 Acute on chronic systolic (congestive) heart failure: Secondary | ICD-10-CM

## 2018-02-14 HISTORY — PX: RIGHT/LEFT HEART CATH AND CORONARY ANGIOGRAPHY: CATH118266

## 2018-02-14 LAB — POCT I-STAT 3, VENOUS BLOOD GAS (G3P V)
ACID-BASE DEFICIT: 1 mmol/L (ref 0.0–2.0)
BICARBONATE: 25 mmol/L (ref 20.0–28.0)
Bicarbonate: 25.4 mmol/L (ref 20.0–28.0)
O2 SAT: 49 %
O2 SAT: 51 %
TCO2: 26 mmol/L (ref 22–32)
TCO2: 27 mmol/L (ref 22–32)
pCO2, Ven: 45.3 mmHg (ref 44.0–60.0)
pCO2, Ven: 44.7 mmHg (ref 44.0–60.0)
pH, Ven: 7.351 (ref 7.250–7.430)
pH, Ven: 7.363 (ref 7.250–7.430)
pO2, Ven: 28 mmHg — CL (ref 32.0–45.0)
pO2, Ven: 28 mmHg — CL (ref 32.0–45.0)

## 2018-02-14 LAB — ECHOCARDIOGRAM COMPLETE
Height: 69.5 in
Weight: 2123.47 oz

## 2018-02-14 LAB — PROTIME-INR
INR: 1.08
PROTHROMBIN TIME: 13.9 s (ref 11.4–15.2)

## 2018-02-14 LAB — POCT I-STAT 3, ART BLOOD GAS (G3+)
Acid-base deficit: 3 mmol/L — ABNORMAL HIGH (ref 0.0–2.0)
Bicarbonate: 20.8 mmol/L (ref 20.0–28.0)
O2 Saturation: 95 %
PH ART: 7.425 (ref 7.350–7.450)
TCO2: 22 mmol/L (ref 22–32)
pCO2 arterial: 31.6 mmHg — ABNORMAL LOW (ref 32.0–48.0)
pO2, Arterial: 72 mmHg — ABNORMAL LOW (ref 83.0–108.0)

## 2018-02-14 SURGERY — RIGHT/LEFT HEART CATH AND CORONARY ANGIOGRAPHY
Anesthesia: LOCAL

## 2018-02-14 MED ORDER — HEPARIN SODIUM (PORCINE) 1000 UNIT/ML IJ SOLN
INTRAMUSCULAR | Status: DC | PRN
  Administered 2018-02-14: 3000 [IU] via INTRAVENOUS

## 2018-02-14 MED ORDER — LIDOCAINE HCL (PF) 1 % IJ SOLN
INTRAMUSCULAR | Status: AC
  Filled 2018-02-14: qty 30

## 2018-02-14 MED ORDER — CARVEDILOL 3.125 MG PO TABS: 3.1250 mg | ORAL_TABLET | Freq: Two times a day (BID) | ORAL | Status: DC

## 2018-02-14 MED ORDER — VERAPAMIL HCL 2.5 MG/ML IV SOLN
INTRAVENOUS | Status: AC
  Filled 2018-02-14: qty 2

## 2018-02-14 MED ORDER — IOPAMIDOL (ISOVUE-370) INJECTION 76%
INTRAVENOUS | Status: DC | PRN
  Administered 2018-02-14: 50 mL via INTRA_ARTERIAL

## 2018-02-14 MED ORDER — FUROSEMIDE 10 MG/ML IJ SOLN
20.0000 mg | Freq: Every day | INTRAMUSCULAR | Status: DC
  Administered 2018-02-14: 20 mg via INTRAVENOUS
  Filled 2018-02-14: qty 2

## 2018-02-14 MED ORDER — MIDAZOLAM HCL 2 MG/2ML IJ SOLN
INTRAMUSCULAR | Status: AC
  Filled 2018-02-14: qty 2

## 2018-02-14 MED ORDER — FENTANYL CITRATE (PF) 100 MCG/2ML IJ SOLN
INTRAMUSCULAR | Status: AC
  Filled 2018-02-14: qty 2

## 2018-02-14 MED ORDER — MIDAZOLAM HCL 2 MG/2ML IJ SOLN
INTRAMUSCULAR | Status: DC | PRN
  Administered 2018-02-14: 1 mg via INTRAVENOUS

## 2018-02-14 MED ORDER — FUROSEMIDE 10 MG/ML IJ SOLN: 40.0000 mg | Freq: Two times a day (BID) | INTRAMUSCULAR | Status: DC

## 2018-02-14 MED ORDER — SODIUM CHLORIDE 0.9 % IV SOLN: 250.0000 mL | INTRAVENOUS | Status: DC | PRN

## 2018-02-14 MED ORDER — ATORVASTATIN CALCIUM 80 MG PO TABS
80.0000 mg | ORAL_TABLET | Freq: Every day | ORAL | Status: DC
  Administered 2018-02-14 – 2018-02-17 (×4): 80 mg via ORAL
  Filled 2018-02-14 (×4): qty 1

## 2018-02-14 MED ORDER — IOPAMIDOL (ISOVUE-370) INJECTION 76%
INTRAVENOUS | Status: AC
  Filled 2018-02-14: qty 100

## 2018-02-14 MED ORDER — HEPARIN SODIUM (PORCINE) 5000 UNIT/ML IJ SOLN: 5000.0000 [IU] | Freq: Three times a day (TID) | INTRAMUSCULAR | Status: DC

## 2018-02-14 MED ORDER — DIGOXIN 125 MCG PO TABS
0.1250 mg | ORAL_TABLET | Freq: Every day | ORAL | Status: DC
  Administered 2018-02-14 – 2018-02-17 (×4): 0.125 mg via ORAL
  Filled 2018-02-14 (×4): qty 1

## 2018-02-14 MED ORDER — SPIRONOLACTONE 12.5 MG HALF TABLET
12.5000 mg | ORAL_TABLET | Freq: Every day | ORAL | Status: DC
  Administered 2018-02-14 – 2018-02-16 (×3): 12.5 mg via ORAL
  Filled 2018-02-14 (×4): qty 1

## 2018-02-14 MED ORDER — SODIUM CHLORIDE 0.9% FLUSH
3.0000 mL | Freq: Two times a day (BID) | INTRAVENOUS | Status: DC
  Administered 2018-02-14 – 2018-02-17 (×5): 3 mL via INTRAVENOUS

## 2018-02-14 MED ORDER — LIDOCAINE HCL (PF) 1 % IJ SOLN
INTRAMUSCULAR | Status: DC | PRN
  Administered 2018-02-14 (×2): 2 mL

## 2018-02-14 MED ORDER — HEPARIN (PORCINE) IN NACL 2-0.9 UNIT/ML-% IJ SOLN
INTRAMUSCULAR | Status: AC
  Filled 2018-02-14: qty 1000

## 2018-02-14 MED ORDER — SODIUM CHLORIDE 0.9% FLUSH: 3.0000 mL | INTRAVENOUS | Status: DC | PRN

## 2018-02-14 MED ORDER — HEPARIN (PORCINE) IN NACL 2-0.9 UNIT/ML-% IJ SOLN
INTRAMUSCULAR | Status: AC | PRN
  Administered 2018-02-14: 500 mL

## 2018-02-14 MED ORDER — FENTANYL CITRATE (PF) 100 MCG/2ML IJ SOLN
INTRAMUSCULAR | Status: DC | PRN
  Administered 2018-02-14: 25 ug via INTRAVENOUS

## 2018-02-14 MED ORDER — HEPARIN SODIUM (PORCINE) 1000 UNIT/ML IJ SOLN
INTRAMUSCULAR | Status: AC
  Filled 2018-02-14: qty 1

## 2018-02-14 MED ORDER — VERAPAMIL HCL 2.5 MG/ML IV SOLN
INTRAVENOUS | Status: DC | PRN
  Administered 2018-02-14: 8 mL via INTRA_ARTERIAL

## 2018-02-14 MED ORDER — HEPARIN (PORCINE) IN NACL 100-0.45 UNIT/ML-% IJ SOLN
1000.0000 [IU]/h | INTRAMUSCULAR | Status: DC
  Administered 2018-02-14 – 2018-02-15 (×2): 1000 [IU]/h via INTRAVENOUS
  Filled 2018-02-14 (×2): qty 250

## 2018-02-14 SURGICAL SUPPLY — 13 items
BAND ZEPHYR COMPRESS 30 LONG (HEMOSTASIS) ×2 IMPLANT
CATH BALLN WEDGE 5F 110CM (CATHETERS) ×2 IMPLANT
CATH INFINITI 5 FR JL3.5 (CATHETERS) ×2 IMPLANT
CATH INFINITI JR4 5F (CATHETERS) ×2 IMPLANT
GUIDEWIRE .025 260CM (WIRE) ×2 IMPLANT
GUIDEWIRE INQWIRE 1.5J.035X260 (WIRE) ×1 IMPLANT
INQWIRE 1.5J .035X260CM (WIRE) ×2
KIT HEART LEFT (KITS) ×2 IMPLANT
PACK CARDIAC CATHETERIZATION (CUSTOM PROCEDURE TRAY) ×2 IMPLANT
SHEATH GLIDE SLENDER 4/5FR (SHEATH) ×2 IMPLANT
SHEATH RAIN RADIAL 21G 6FR (SHEATH) ×4 IMPLANT
TRANSDUCER W/STOPCOCK (MISCELLANEOUS) ×2 IMPLANT
TUBING CIL FLEX 10 FLL-RA (TUBING) ×2 IMPLANT

## 2018-02-14 NOTE — Care Management Note (Addendum)
Case Management Note  Patient Details  Name: Ricardo Brooks MRN: [id number] Date of Birth: [birth year]  Subjective/Objective:  From home presents with Acute systolic CHF, ICm,  With EF of 19%, ICM, HTN, tobacco abuse, bipolar d/o.  Most likely will need life vest at discharge. Life Vest order left on chart for MD to fill out and sign if they decide on getting life vest for patient.    3/15 1624 Letha Capeeborah Nyara Capell RN, BSN- NCM spoke with Rosanne AshingJim with [employer] life vest, he states patient has been approved, per MD patient will need life vest.  NCM will need to call Rosanne AshingJim with [employer] the day before patient is discharge to notify him at [phone].                   Action/Plan: NCM will follow for transition of care.   Expected Discharge Date:                  Expected Discharge Plan:  Home/Self Care  In-House Referral:     Discharge planning Services  CM Consult  Post Acute Care Choice:    Choice offered to:     DME Arranged:    DME Agency:     HH Arranged:    HH Agency:     Status of Service:  In process, will continue to follow  If discussed at Long Length of Stay Meetings, dates discussed:    Additional Comments:  Leone Havenaylor, Keiondre Colee Clinton, RN 02/14/2018, 3:31 PM

## 2018-02-14 NOTE — Consult Note (Addendum)
Advanced Heart Failure Team Consult Note   Primary Physician: Gilda CreasePavelock, Richard M, MD PCP-Cardiologist:  No primary care provider on file.  Reason for Consultation: Acute systolic CHF   HPI:    Ricardo Brooks is seen today for evaluation of Acute systolic CHF at the request of Dr. Willaim RayasBerry  Ricardo Brooks is a 62 y.o. male with HTN off meds, bipolar disorder, tobacco abuse, and chronic hip.back pain.    Pt has no prior cardiac history. Prior to this week, he was walking on a daily basis. HE had no CP, lightheadedness, dizziness, or peripheral edema. He noticed a significant increase in his SOB on 02/12/18 and reported to the ED.  EKG showed no acute changes, but LVH criteria and non-specific lateral TWI. Pertinent labs on admission include K 4.1, Creatinine 0.91, BNP 633.4, and WBC 3.8. CXR showed mild scarring LLL without edema. CTA chest negative for PE. Proceeded to Lexiscan am of 02/13/18 which showed significant defect. Taken for cath as below which showed severe LAD disease.  HF team consulted for medical optimization prior to viability study for PCI consideration.   He is feeling fine currently s/p cath. He states he has been more fatigued over the 3-4 weeks. He usually walks from his house to [employer] (approx 2 blocks), but has had decreased endurance, having to stop more often. He denies peripheral edema. + occasional orthopnea and lightheadedness, but not marked or limiting. Denies CP or palpitations. He denies any personal or family history of heart disease to his knowledge.   Formal Echo pending.   Lexiscan: 02/13/18   There was no ST segment deviation noted during stress.  Defect 1: There is a large defect of severe severity present in the basal anteroseptal, mid anteroseptal, mid inferoseptal, mid inferior, apical anterior, apical septal, apical inferior, apical lateral and apex location.  This is a high risk study.  Findings consistent with prior myocardial infarction. No  ischemia.  Nuclear stress EF: 19%. Severe generalized hypokinesis.  The left ventricular ejection fraction is severely decreased (<30%).  LHC 02/14/18 1. Severe single-vessel coronary artery disease, including 99% chronic subtotal occlusion of the ostial LAD with TIMI-1 flow and left-to-left and right-to-left collaterals. Small D1, ramus, and RV marginal branches have 80-90%% ostial stenoses. 2. Moderately elevated left and right heart filling pressures. 3. Moderate pulmonary hypertension. 4. Low Fick cardiac output/index.  RHC  RA (mean): 11 mmHg RV (S/EDP): 68/10 mmHg PA (S/D, mean): 65/28 (40) mmHg PCWP (mean): 30 mmHg with prominent v-waves Ao sat: 95% PA sat: 50% Fick CO: 2.9 L/min Fick CI: 1.7 L/min/m^2  Review of systems complete and found to be negative unless listed in HPI.    Home Medications Prior to Admission medications   Medication Sig Start Date End Date Taking? Authorizing Provider  acetaminophen (TYLENOL) 500 MG tablet Take 1,000 mg by mouth every 6 (six) hours as needed for mild pain.    Yes [provider]  ARIPiprazole (ABILIFY) 15 MG tablet Take 7.5 mg by mouth daily.   Yes [provider]  Cholecalciferol (VITAMIN D PO) Take 1 tablet by mouth daily.   Yes [provider]  Cyanocobalamin (VITAMIN B-12 PO) Take 1 tablet by mouth daily.   Yes [provider]  ibuprofen (ADVIL,MOTRIN) 800 MG tablet Take 1 tablet (800 mg total) by mouth every 8 (eight) hours as needed. 05/05/17  Yes Long, Arlyss RepressJoshua G, MD  loratadine (CLARITIN) 10 MG tablet Take 10 mg by mouth daily as needed for allergies.  09/09/13  Yes [name], MD  naproxen (NAPROSYN) 500 MG tablet Take 1 tablet (500 mg total) by mouth 2 (two) times daily. 01/23/16  Yes Hedges, Tinnie Gens, PA-C  traMADol (ULTRAM) 50 MG tablet Take 1-2 tablets (50-100 mg total) by mouth every 6 (six) hours as needed. 10/01/17  Yes Kathryne Hitch, MD  azithromycin (ZITHROMAX) 250 MG tablet Take  1 tablet (250 mg total) by mouth daily. Take first 2 tablets together, then 1 every day until finished. Patient not taking: Reported on 02/12/2018 03/03/17   Deborha Payment, PA-C  cyclobenzaprine (FLEXERIL) 10 MG tablet Take 1 tablet (10 mg total) by mouth 2 (two) times daily as needed for muscle spasms. Patient not taking: Reported on 02/12/2018 04/22/16   Fayrene Helper, PA-C  fluticasone [facility]) 50 MCG/ACT nasal spray Place 2 sprays into both nostrils daily. Patient not taking: Reported on 02/12/2018 03/03/17   Deborha Payment, PA-C  HYDROcodone-acetaminophen (NORCO/VICODIN) 5-325 MG tablet Take 1 tablet by mouth every 4 (four) hours as needed. Patient not taking: Reported on 02/12/2018 08/17/17   Jacalyn Lefevre, MD  meclizine (ANTIVERT) 25 MG tablet Take 1 tablet (25 mg total) by mouth 3 (three) times daily as needed for dizziness. Patient not taking: Reported on 02/12/2018 08/13/14   [name], MD  ondansetron (ZOFRAN) 4 MG tablet Take 1 tablet (4 mg total) by mouth every 6 (six) hours. Patient not taking: Reported on 02/12/2018 07/09/14   [name], PA-C  predniSONE (DELTASONE) 50 MG tablet Take 1 tablet (50 mg total) by mouth daily. Patient not taking: Reported on 02/12/2018 06/29/16   Charlestine Night, PA-C    Past Medical History: Past Medical History:  Diagnosis Date  . Asthma   . Bipolar 1 disorder (HCC)   . Chronic back pain   . Hip pain, chronic   . Hypertension   . PTSD (post-traumatic stress disorder)     Past Surgical History: Past Surgical History:  Procedure Laterality Date  . orif right ankle Right 1985  . RIGHT/LEFT HEART CATH AND CORONARY ANGIOGRAPHY N/A 02/14/2018   Procedure: RIGHT/LEFT HEART CATH AND CORONARY ANGIOGRAPHY;  Surgeon: Yvonne Kendall, MD;  Location: [facility];  Service: Cardiovascular;  Laterality: N/A;    Family History: No family history of CAD, CHF, or sudden cardiac death.   Social History: Social History   Socioeconomic  History  . Marital status: Single    Spouse name: None  . Number of children: None  . Years of education: None  . Highest education level: None  Social Needs  . Financial resource strain: None  . Food insecurity - worry: None  . Food insecurity - inability: None  . Transportation needs - medical: None  . Transportation needs - non-medical: None  Occupational History  . None  Tobacco Use  . Smoking status: Current Every Day Smoker    Packs/day: 0.50    Types: Cigarettes  . Smokeless tobacco: Never Used  Substance and Sexual Activity  . Alcohol use: Yes  . Drug use: No  . Sexual activity: None  Other Topics Concern  . None  Social History Narrative  . None    Allergies:  Allergies  Allergen Reactions  . Methocarbamol Nausea And Vomiting and Anxiety    Objective:    Vital Signs:   Temp:  [98.1 F (36.7 C)-98.6 F (37 C)] 98.1 F (36.7 C) (03/14 1100) Pulse Rate:  [0-89] 80 (03/14 1100) Resp:  [0-39] 25 (03/14 0922) BP: (99-135)/(60-88) 100/78 (03/14  1100) SpO2:  [0 %-100 %] 100 % (03/14 1100) Weight:  [132 lb 11.5 oz (60.2 kg)] 132 lb 11.5 oz (60.2 kg) (03/14 0500) Last BM Date: 02/13/18  Weight change: Filed Weights   02/12/18 2322 02/13/18 0500 02/14/18 0500  Weight: 133 lb 9.6 oz (60.6 kg) 132 lb 7.9 oz (60.1 kg) 132 lb 11.5 oz (60.2 kg)    Intake/Output:   Intake/Output Summary (Last 24 hours) at 02/14/2018 1300 Last data filed at 02/14/2018 0981 Gross per 24 hour  Intake 552.5 ml  Output -  Net 552.5 ml      Physical Exam    General:  Well appearing. No resp difficulty HEENT: normal Neck: supple. JVP . Carotids 2+ bilat; no bruits. No lymphadenopathy or thyromegaly appreciated. Cor: PMI nondisplaced. Regular rate & rhythm. No rubs, gallops or murmurs. Lungs: clear Abdomen: soft, nontender, nondistended. No hepatosplenomegaly. No bruits or masses. Good bowel sounds. Extremities: no cyanosis, clubbing, rash, edema Neuro: alert & orientedx3,  cranial nerves grossly intact. moves all 4 extremities w/o difficulty. Affect pleasant   Telemetry   NSR 80-90s, personally reviewed.   EKG    02/13/18 Sinus rhythm 91 bpm, LVH, Personally reviewed  Labs   Basic Metabolic Panel: Recent Labs  Lab 02/12/18 1032 02/12/18 1928 02/13/18 0615  NA 140  --  138  K 4.1  --  4.0  CL 106  --  105  CO2 24  --  23  GLUCOSE 108*  --  96  BUN 9  --  11  CREATININE 0.91 0.89 0.97  CALCIUM 9.3  --  8.8*    Liver Function Tests: No results for input(s): AST, ALT, ALKPHOS, BILITOT, PROT, ALBUMIN in the last 168 hours. No results for input(s): LIPASE, AMYLASE in the last 168 hours. No results for input(s): AMMONIA in the last 168 hours.  CBC: Recent Labs  Lab 02/12/18 1032 02/12/18 1928 02/13/18 0615  WBC 3.8* 5.1 4.0  HGB 14.2 12.9* 13.0  HCT 44.4 39.5 40.1  MCV 93.9 92.1 93.0  PLT 188 174 185    Cardiac Enzymes: Recent Labs  Lab 02/12/18 1113 02/12/18 1928 02/13/18 0012 02/13/18 0615  TROPONINI 0.10* 0.13* 0.10* 0.10*    BNP: BNP (last 3 results) Recent Labs    02/12/18 1114  BNP 633.4*    ProBNP (last 3 results) No results for input(s): PROBNP in the last 8760 hours.   CBG: No results for input(s): GLUCAP in the last 168 hours.  Coagulation Studies: Recent Labs    02/14/18 0404  LABPROT 13.9  INR 1.08     Imaging   Nm Myocar Multi W/spect W/wall Motion / Ef  Result Date: 02/13/2018  There was no ST segment deviation noted during stress.  Defect 1: There is a large defect of severe severity present in the basal anteroseptal, mid anteroseptal, mid inferoseptal, mid inferior, apical anterior, apical septal, apical inferior, apical lateral and apex location.  This is a high risk study.  Findings consistent with prior myocardial infarction. No ischemia.  Nuclear stress EF: 19%. Severe generalized hypokinesis.  The left ventricular ejection fraction is severely decreased (<30%).  Donato Schultz, MD       Medications:     Current Medications: . aspirin EC  81 mg Oral Daily  . atorvastatin  80 mg Oral q1800  . furosemide  20 mg Intravenous Daily  . heparin  5,000 Units Subcutaneous Q8H  . sodium chloride flush  3 mL Intravenous Q12H  Infusions: . sodium chloride        Patient Profile   Ricardo Brooks is a 62 y.o. male with a history of hypertension (currently not on any medications), bipolar disorder, ongoing tobacco abuse, chronic back and hip pain.    Admitted to [facility] 02/12/18 with SOB. No prior cardiac history. EKG with LVH and non-specific TWI laterally. Taken for cath which showed severe single vessel disease in ostial LAD with left-to-left and right-to-left collateral.   Assessment/Plan   1. Acute systolic CHF, ICM - Echo pending.  - Lexiscan 02/13/18 with EF 19% - RHC with moderately elevated filling pressures and low cardiac output. Mixed venous saturation 49%.  - Volume status ? Mildly elevated. Given low dose IV lasix this am. May be able to switch to po.  - Add spiro 12.5 mg daily. - Add digoxin 0.125 mg daily.  - Hold BB with marginal/low cardiac index.  - Consider ARB for eventual consideration for Entresto.  - Would recommend cMRI for viability. Will discuss timing with MD.  - Consider Lifevest as below.   2. ICM  - LHC 02/14/18 with severe single-vessel coronary artery disease, including 99% chronic subtotal occlusion of the ostial LAD with TIMI-1 flow and left-to-left and right-to-left collaterals.  - Continue ASA and statin - cMRI once medically optimized for viability.  - Consider Lifevest on discharge with repeat Echo 3 months on GDMT for ICD consideration.   3. HTN - Will adjust meds in setting of adjusting his HF meds.  4. Tobacco abuse - Encouraged complete cessation  5. Bipolar disorder - Encouraged follow up.   Medication concerns reviewed with patient and pharmacy team. Barriers identified: None at this time. Has Medicaid.   Length  of Stay: 1  Graciella Freer, [place]  02/14/2018, 1:00 PM  Advanced Heart Failure Team Pager [phone] (M-F; 7a - 4p)  Please contact [facility] for night-coverage after hours (4p -7a ) and weekends on [email or web address]  Patient seen with PA, agree with the above note.  He has had progressive exertional dyspnea, no chest pain. Cath showed subtotal LAD occlusion with collaterals.  I reviewed cath results, cardiac index was low and PCWP was high.  He is comfortable on exam, mild JVD (8-9 cm). No S3.    1. Acute on chronic systolic CHF: Ischemic cardiomyopathy.  EF 19% by Cardiolite.  He has mild volume overload by exam and elevated PCWP/low cardiac output by cath.  He does not look uncomfortable.  BP is stable.  - Needs echo.  - For now, would start digoxin 0.125 and spironolactone 12.5 daily.  Losartan possibly tomorrow.  - Lasix 40 mg IV bid.   2. CAD: Subtotal LAD occlusion with collaterals. This is not acute. Question at this point will be viability in LAD territory.  If viable, would recommend returning to cath lab to open LAD>  - Will plan MRI viability study.  - Continue ASA and statin.  3. Smoking: Recommended cessation.   Marca Ancona 02/14/2018 2:17 PM

## 2018-02-14 NOTE — Progress Notes (Signed)
  Echocardiogram 2D Echocardiogram was attempted but patient was going to the cath lab. Will try again later.   Ricardo SavoyCasey N Flor Whitacre 02/14/2018, 8:19 AM

## 2018-02-14 NOTE — Progress Notes (Signed)
ANTICOAGULATION CONSULT NOTE - Initial Consult  Pharmacy Consult for Heparin  Indication: LV thrombus  Allergies  Allergen Reactions  . Methocarbamol Nausea And Vomiting and Anxiety    Patient Measurements: Height: 5' 9.5" (176.5 cm) Weight: 132 lb 11.5 oz (60.2 kg) IBW/kg (Calculated) : 71.85  Vital Signs: Temp: 98.4 F (36.9 C) (03/14 1400) Temp Source: Oral (03/14 1400) BP: 117/81 (03/14 1400) Pulse Rate: 77 (03/14 1400)  Labs: Recent Labs    02/12/18 1032  02/12/18 1928 02/13/18 0012 02/13/18 0615 02/14/18 0404  HGB 14.2  --  12.9*  --  13.0  --   HCT 44.4  --  39.5  --  40.1  --   PLT 188  --  174  --  185  --   LABPROT  --   --   --   --   --  13.9  INR  --   --   --   --   --  1.08  CREATININE 0.91  --  0.89  --  0.97  --   TROPONINI  --    < > 0.13* 0.10* 0.10*  --    < > = values in this interval not displayed.    Estimated Creatinine Clearance: 69.8 mL/min (by C-G formula based on SCr of 0.97 mg/dL).   Medical History: Past Medical History:  Diagnosis Date  . Asthma   . Bipolar 1 disorder (HCC)   . Chronic back pain   . Hip pain, chronic   . Hypertension   . PTSD (post-traumatic stress disorder)     Assessment: 62 year old male to begin heparin for LV thrombus S/p cath this morning  Goal of Therapy:  Heparin level 0.3-0.7 units/ml Monitor platelets by anticoagulation protocol: Yes   Plan:  Heparin drip at 1000 units / hr 6 hour heparin level Daily heparin level, CBC  Thank you Okey RegalLisa Ray Gervasi, PharmD [phone]  02/14/2018,4:22 PM

## 2018-02-14 NOTE — Progress Notes (Signed)
Patient's TR band is fully deflated and patient has been reminded frequently to keep the arm resting on a pillow in the bed.

## 2018-02-14 NOTE — Progress Notes (Addendum)
Progress Note  Patient Name: Ricardo CabalDavid Brooks Date of Encounter: 02/14/2018  Primary Cardiologist: No primary care provider on file.  Subjective   No chest pain. Seen in cath lab.  Inpatient Medications    Scheduled Meds: . [MAR Hold] aspirin EC  81 mg Oral Daily  . [MAR Hold] heparin  5,000 Units Subcutaneous Q8H  . [MAR Hold] hydrochlorothiazide  12.5 mg Oral Daily  . [MAR Hold] metoprolol tartrate  12.5 mg Oral BID  . sodium chloride flush  3 mL Intravenous Q12H   Continuous Infusions: . sodium chloride    . sodium chloride 50 mL/hr at 02/14/18 0604  . heparin    . heparin     PRN Meds: sodium chloride, [MAR Hold] acetaminophen, fentaNYL, heparin, heparin, heparin, lidocaine (PF), midazolam, [MAR Hold] nitroGLYCERIN, [MAR Hold] ondansetron (ZOFRAN) IV, Radial Cocktail/Verapamil only, sodium chloride flush   Vital Signs    Vitals:   02/13/18 2047 02/14/18 0500 02/14/18 0800 02/14/18 0904  BP: 113/60  115/87   Pulse: 69  67   Resp: 16     Temp: 98.6 F (37 C)  98.2 F (36.8 C)   TempSrc: Oral  Oral   SpO2: 100%  99% 95%  Weight:  132 lb 11.5 oz (60.2 kg)    Height:        Intake/Output Summary (Last 24 hours) at 02/14/2018 0926 Last data filed at 02/14/2018 [id number] Gross per 24 hour  Intake 552.5 ml  Output -  Net 552.5 ml   Filed Weights   02/12/18 2322 02/13/18 0500 02/14/18 0500  Weight: 133 lb 9.6 oz (60.6 kg) 132 lb 7.9 oz (60.1 kg) 132 lb 11.5 oz (60.2 kg)    Telemetry    SR - Personally Reviewed  ECG    N/a - Personally Reviewed  Physical Exam   General: Well developed, well nourished, male appearing in no acute distress. Head: Normocephalic, atraumatic.  Neck: Supple without bruits, JVD. Lungs:  Resp regular and unlabored, CTA. Heart: RRR, S1, S2, no S3, S4, or murmur; no rub. Abdomen: Soft, non-tender, non-distended with normoactive bowel sounds.  Extremities: No clubbing, cyanosis, edema. Distal pedal pulses are 2+ bilaterally. Neuro:  Alert and oriented X 3. Moves all extremities spontaneously. Psych: Normal affect.  Labs    Chemistry Recent Labs  Lab 02/12/18 1032 02/12/18 1928 02/13/18 0615  NA 140  --  138  K 4.1  --  4.0  CL 106  --  105  CO2 24  --  23  GLUCOSE 108*  --  96  BUN 9  --  11  CREATININE 0.91 0.89 0.97  CALCIUM 9.3  --  8.8*  GFRNONAA >60 >60 >60  GFRAA >60 >60 >60  ANIONGAP 10  --  10     Hematology Recent Labs  Lab 02/12/18 1032 02/12/18 1928 02/13/18 0615  WBC 3.8* 5.1 4.0  RBC 4.73 4.29 4.31  HGB 14.2 12.9* 13.0  HCT 44.4 39.5 40.1  MCV 93.9 92.1 93.0  MCH 30.0 30.1 30.2  MCHC 32.0 32.7 32.4  RDW 14.1 14.1 14.2  PLT 188 174 185    Cardiac Enzymes Recent Labs  Lab 02/12/18 1113 02/12/18 1928 02/13/18 0012 02/13/18 0615  TROPONINI 0.10* 0.13* 0.10* 0.10*    Recent Labs  Lab 02/12/18 1052  TROPIPOC 0.16*     BNP Recent Labs  Lab 02/12/18 1114  BNP 633.4*     DDimer No results for input(s): DDIMER in the last 168 hours.  Radiology    Dg Chest 2 View  Result Date: 02/12/2018 CLINICAL DATA:  Shortness of Breath EXAM: CHEST - 2 VIEW COMPARISON:  None. FINDINGS: There is mild scarring in the left lower lobe. There is no edema or consolidation. The heart size and pulmonary vascularity are normal. No adenopathy. No bone lesions. IMPRESSION: Mild scarring left lower lobe.  No edema or consolidation. Electronically Signed   By: Bretta Bang III M.D.   On: 02/12/2018 10:44   Ct Angio Chest Pe W And/or Wo Contrast  Result Date: 02/12/2018 CLINICAL DATA:  Shortness of breath. EXAM: CT ANGIOGRAPHY CHEST WITH CONTRAST TECHNIQUE: Multidetector CT imaging of the chest was performed using the standard protocol during bolus administration of intravenous contrast. Multiplanar CT image reconstructions and MIPs were obtained to evaluate the vascular anatomy. CONTRAST:  ISOVUE-370 IOPAMIDOL (ISOVUE-370) INJECTION 76% COMPARISON:  Chest x-ray dated 02/12/2018  FINDINGS: Cardiovascular: Satisfactory opacification of the pulmonary arteries to the segmental level. No evidence of pulmonary embolism. Cardiomegaly. No pericardial effusion. Mediastinum/Nodes: Slightly prominent nonspecific mediastinal lymph nodes including a 14 mm node in the aortopulmonary window and a 14 mm precarinal lymph node. Trachea and esophagus demonstrate no significant findings. Lungs/Pleura: Emphysematous changes in the upper lobes. Small bilateral pleural effusions, right greater than left. Peribronchial thickening bilaterally. No infiltrates. Upper Abdomen: No acute abnormality. Musculoskeletal: No chest wall abnormality. No acute or significant osseous findings. Review of the MIP images confirms the above findings. IMPRESSION: 1. No pulmonary emboli. 2. Cardiomegaly with small bilateral pleural effusions. 3. Nonspecific mediastinal adenopathy. Electronically Signed   By: Francene Boyers M.D.   On: 02/12/2018 12:18   Nm Myocar Multi W/spect W/wall Motion / Ef  Result Date: 02/13/2018  There was no ST segment deviation noted during stress.  Defect 1: There is a large defect of severe severity present in the basal anteroseptal, mid anteroseptal, mid inferoseptal, mid inferior, apical anterior, apical septal, apical inferior, apical lateral and apex location.  This is a high risk study.  Findings consistent with prior myocardial infarction. No ischemia.  Nuclear stress EF: 19%. Severe generalized hypokinesis.  The left ventricular ejection fraction is severely decreased (<30%).  Ricardo Schultz, MD    Cardiac Studies   Lexiscan: 02/13/18   There was no ST segment deviation noted during stress.  Defect 1: There is a large defect of severe severity present in the basal anteroseptal, mid anteroseptal, mid inferoseptal, mid inferior, apical anterior, apical septal, apical inferior, apical lateral and apex location.  This is a high risk study.  Findings consistent with prior myocardial  infarction. No ischemia.  Nuclear stress EF: 19%. Severe generalized hypokinesis.  The left ventricular ejection fraction is severely decreased (<30%).   Ricardo Schultz, MD  Patient Profile     62 y.o. male with a history of hypertension (currently not on any medications), bipolar disorder, ongoing tobacco abuse, chronic back and hip pain who presented with shortness of breath and underwent stress testing that was abnormal and sent for cath.   Assessment & Plan    1. DOE/Elevated troponin: patient presented with chronic DOE which has worsened over the past few days. Also with intermittent orthopnea and palpitations. CTA negative for PE but revealed b/l pleural effusions. BNP elevated to 600s. Troponin trend 0.1>0.13>0.1. EKG with LVH criteria and non-specific T wave inversion laterally. Underwent stress test which was high risk, and planned for cath today. Reviewed by Dr. Allyson Sabal and Dr. Okey Dupre with LAD occlusion with collaterals. Will plan for medical therapy.  --  switch metoprolol to coreg. Consider adding Entresto if pressures able to tolerate BB switch.  -- check echo to confirm EF given no LV gram on cath.   2. HTN: taken off antihypertensives by PCP last year for normalization of BP. EKG with LVH criteria. Presented with elevated DBP. Improved this AM. - recommendations noted above  3. Tobacco abuse: - Encourage cessation  4. Bipolar disorder:  - Continue current medications  5. HL: LDL 118. Will add high dose statin  Signed, Laverda Page, NP  02/14/2018, 9:26 AM  Pager # [phone]   For questions or updates, please contact [facility] Please consult [email or web address] for contact info under Cardiology/STEMI.    Agree with note by Laverda Page NP-C  Mr. Lyford had right left heart cath done this morning by Dr. Okey Dupre. He had a total ostial LAD with grade 1 right left collaterals, elevated LVEDP and wedge pressure. I spoken to Dr. Shirlee Latch , advanced heart failure service, and  optimizing his medical therapy. He will need a viability study of his anterior wall to determine whether or not there is utility in opening up his LAD. We also should probably consider a LifeVest prior to discharge.  Runell Gess, M.D., FACP, [facility], [facility] [address] [place], [place]  [address]  [phone] 02/14/2018 10:26 AM

## 2018-02-14 NOTE — Interval H&P Note (Signed)
History and Physical Interval Note:  02/14/2018 8:28 AM  Ricardo Brooks  has presented today for cardiac catheterization, with the diagnosis of acute systolic heart failure. The various methods of treatment have been discussed with the patient and family. After consideration of risks, benefits and other options for treatment, the patient has consented to  Procedure(s): RIGHT/LEFT HEART CATH AND CORONARY ANGIOGRAPHY (N/A) as a surgical intervention .  The patient's history has been reviewed, patient examined, no change in status, stable for surgery.  I have reviewed the patient's chart and labs.  Questions were answered to the patient's satisfaction.    Cath Lab Visit (complete for each Cath Lab visit)  Clinical Evaluation Leading to the Procedure:   ACS: No.  Non-ACS:    Anginal Classification: No angina; NYHA class IV heart failure  Anti-ischemic medical therapy: No Therapy  Non-Invasive Test Results: High-risk stress test findings: cardiac mortality >3%/year  Prior CABG: No previous CABG  Pattie Flaharty

## 2018-02-14 NOTE — Progress Notes (Signed)
  Echocardiogram 2D Echocardiogram has been performed.  Ricardo Brooks 02/14/2018, 3:18 PM

## 2018-02-15 ENCOUNTER — Inpatient Hospital Stay (HOSPITAL_COMMUNITY): Payer: Medicaid Other

## 2018-02-15 LAB — BASIC METABOLIC PANEL
ANION GAP: 11 (ref 5–15)
BUN: 19 mg/dL (ref 6–20)
CO2: 21 mmol/L — ABNORMAL LOW (ref 22–32)
Calcium: 8.6 mg/dL — ABNORMAL LOW (ref 8.9–10.3)
Chloride: 105 mmol/L (ref 101–111)
Creatinine, Ser: 0.96 mg/dL (ref 0.61–1.24)
GFR calc Af Amer: >60 mL/min (ref >60)
Glucose, Bld: 105 mg/dL — ABNORMAL HIGH (ref 65–99)
POTASSIUM: 4.1 mmol/L (ref 3.5–5.1)
SODIUM: 137 mmol/L (ref 135–145)

## 2018-02-15 LAB — CBC
HCT: 40.2 % (ref 39.0–52.0)
Hemoglobin: 13 g/dL (ref 13.0–17.0)
MCH: 29.7 pg (ref 26.0–34.0)
MCHC: 32.3 g/dL (ref 30.0–36.0)
MCV: 91.8 fL (ref 78.0–100.0)
PLATELETS: 192 10*3/uL (ref 150–400)
RBC: 4.38 MIL/uL (ref 4.22–5.81)
RDW: 13.8 % (ref 11.5–15.5)
WBC: 4.5 10*3/uL (ref 4.0–10.5)

## 2018-02-15 LAB — HEPARIN LEVEL (UNFRACTIONATED)
HEPARIN UNFRACTIONATED: 0.33 [IU]/mL (ref 0.30–0.70)
Heparin Unfractionated: 0.38 IU/mL (ref 0.30–0.70)

## 2018-02-15 MED ORDER — ARIPIPRAZOLE 5 MG PO TABS
7.5000 mg | ORAL_TABLET | Freq: Every day | ORAL | Status: DC
  Administered 2018-02-15 – 2018-02-17 (×3): 7.5 mg via ORAL
  Filled 2018-02-15 (×3): qty 2

## 2018-02-15 MED ORDER — FUROSEMIDE 10 MG/ML IJ SOLN
40.0000 mg | Freq: Once | INTRAMUSCULAR | Status: AC
  Administered 2018-02-15: 40 mg via INTRAVENOUS
  Filled 2018-02-15: qty 4

## 2018-02-15 MED ORDER — FUROSEMIDE 40 MG PO TABS
40.0000 mg | ORAL_TABLET | Freq: Every day | ORAL | Status: DC
  Administered 2018-02-16 – 2018-02-17 (×2): 40 mg via ORAL
  Filled 2018-02-15 (×2): qty 1

## 2018-02-15 MED ORDER — GADOBENATE DIMEGLUMINE 529 MG/ML IV SOLN
20.0000 mL | Freq: Once | INTRAVENOUS | Status: AC | PRN
  Administered 2018-02-15: 20 mL via INTRAVENOUS

## 2018-02-15 MED ORDER — FUROSEMIDE 10 MG/ML IJ SOLN: 40.0000 mg | Freq: Two times a day (BID) | INTRAMUSCULAR | Status: DC

## 2018-02-15 MED ORDER — LOSARTAN POTASSIUM 25 MG PO TABS
12.5000 mg | ORAL_TABLET | Freq: Every day | ORAL | Status: DC
  Administered 2018-02-15 – 2018-02-17 (×3): 12.5 mg via ORAL
  Filled 2018-02-15 (×3): qty 1

## 2018-02-15 NOTE — Progress Notes (Signed)
ANTICOAGULATION CONSULT NOTE   Pharmacy Consult for Heparin  Indication: LV thrombus  Allergies  Allergen Reactions  . Methocarbamol Nausea And Vomiting and Anxiety    Patient Measurements: Height: 5' 9.5" (176.5 cm) Weight: 133 lb 4.8 oz (60.5 kg) IBW/kg (Calculated) : 71.85  Vital Signs: Temp: 98 F (36.7 C) (03/15 0512) Temp Source: Oral (03/15 0512) BP: 116/77 (03/15 0512) Pulse Rate: 72 (03/15 0512)  Labs: Recent Labs    02/12/18 1032  02/12/18 1928 02/13/18 0012 02/13/18 0615 02/14/18 0404 02/15/18 0012 02/15/18 0600  HGB 14.2  --  12.9*  --  13.0  --   --  13.0  HCT 44.4  --  39.5  --  40.1  --   --  40.2  PLT 188  --  174  --  185  --   --  192  LABPROT  --   --   --   --   --  13.9  --   --   INR  --   --   --   --   --  1.08  --   --   HEPARINUNFRC  --   --   --   --   --   --  0.33 0.38  CREATININE 0.91  --  0.89  --  0.97  --   --   --   TROPONINI  --    < > 0.13* 0.10* 0.10*  --   --   --    < > = values in this interval not displayed.    Estimated Creatinine Clearance: 70.2 mL/min (by C-G formula based on SCr of 0.97 mg/dL).   Medical History: Past Medical History:  Diagnosis Date  . Asthma   . Bipolar 1 disorder (HCC)   . Chronic back pain   . Hip pain, chronic   . Hypertension   . PTSD (post-traumatic stress disorder)     Assessment: 62 year old male to begin heparin for LV thrombus, no bolus given s/p cath.   Initial heparin level was therapeutic at 0.33, confirmatory level 5 hours later remained therapeutic at 0.38, on 1000 units/hr. CBC is stable. No signs/symptoms of bleeding. No infusion issues.    Goal of Therapy:  Heparin level 0.3-0.7 units/ml Monitor platelets by anticoagulation protocol: Yes   Plan:  Continue heparin drip at 1000 units / hr Monitor daily HL and CBC Monitor signs/symptoms of bleeding  Girard CooterKimberly Perkins, PharmD Clinical Pharmacist  Pager: [phone] Clinical Phone for 02/15/2018 until 3:30pm: [phone] If  after 3:30pm, please call main pharmacy at [phone]

## 2018-02-15 NOTE — Progress Notes (Addendum)
Advanced Heart Failure Rounding Note  PCP-Cardiologist: No primary care provider on file.   Subjective:    Echo 02/14/18 with LVEF 20-25%, . Grade 2 DD, Mild MR, Mod LAE, RV normal, PA peak pressure 44 mmHG + apical LV thrombus  Feeling good this morning.  Denies SOB or CP.   Lasix ordered defaulted (vs changed) to TONIGHT at 1800.   BMET pending.   For cMRI this am.   Objective:   Weight Range: 133 lb 4.8 oz (60.5 kg) Body mass index is 19.4 kg/m.   Vital Signs:   Temp:  [98 F (36.7 C)-98.4 F (36.9 C)] 98 F (36.7 C) (03/15 0512) Pulse Rate:  [72-81] 72 (03/15 0512) Resp:  [25-28] 25 (03/15 0512) BP: (100-118)/(65-81) 116/77 (03/15 0512) SpO2:  [99 %-100 %] 100 % (03/15 0512) Weight:  [133 lb 4.8 oz (60.5 kg)] 133 lb 4.8 oz (60.5 kg) (03/15 0512) Last BM Date: 02/14/18  Weight change: Filed Weights   02/13/18 0500 02/14/18 0500 02/15/18 0512  Weight: 132 lb 7.9 oz (60.1 kg) 132 lb 11.5 oz (60.2 kg) 133 lb 4.8 oz (60.5 kg)   Intake/Output:   Intake/Output Summary (Last 24 hours) at 02/15/2018 0949 Last data filed at 02/15/2018 0600 Gross per 24 hour  Intake 1219.16 ml  Output -  Net 1219.16 ml   Physical Exam    General:  Well appearing. No resp difficulty HEENT: Normal Neck: Supple. JVP ~7-8 cm . Carotids 2+ bilat; no bruits. No lymphadenopathy or thyromegaly appreciated. Cor: PMI nondisplaced. Regular rate & rhythm. No rubs, gallops or murmurs. Lungs: Clear Abdomen: Soft, nontender, nondistended. No hepatosplenomegaly. No bruits or masses. Good bowel sounds. Extremities: No cyanosis, clubbing, rash, or edema Neuro: Alert & orientedx3, cranial nerves grossly intact. moves all 4 extremities w/o difficulty. Affect pleasant  Telemetry   NSR, personally reviewed.   EKG    No new tracings.    Labs    CBC Recent Labs    02/13/18 0615 02/15/18 0600  WBC 4.0 4.5  HGB 13.0 13.0  HCT 40.1 40.2  MCV 93.0 91.8  PLT 185 192   Basic Metabolic  Panel Recent Labs    02/12/18 1032 02/12/18 1928 02/13/18 0615  NA 140  --  138  K 4.1  --  4.0  CL 106  --  105  CO2 24  --  23  GLUCOSE 108*  --  96  BUN 9  --  11  CREATININE 0.91 0.89 0.97  CALCIUM 9.3  --  8.8*   Liver Function Tests No results for input(s): AST, ALT, ALKPHOS, BILITOT, PROT, ALBUMIN in the last 72 hours. No results for input(s): LIPASE, AMYLASE in the last 72 hours. Cardiac Enzymes Recent Labs    02/12/18 1928 02/13/18 0012 02/13/18 0615  TROPONINI 0.13* 0.10* 0.10*   BNP: BNP (last 3 results) Recent Labs    02/12/18 1114  BNP 633.4*   ProBNP (last 3 results) No results for input(s): PROBNP in the last 8760 hours.  D-Dimer No results for input(s): DDIMER in the last 72 hours. Hemoglobin A1C Recent Labs    02/13/18 0012  HGBA1C 5.7*   Fasting Lipid Panel Recent Labs    02/13/18 0253  CHOL 179  HDL 37*  LDLCALC 118*  TRIG 120  CHOLHDL 4.8   Thyroid Function Tests Recent Labs    02/12/18 1852  TSH 1.651   Other results:  Imaging    No results found.  Medications:  Scheduled Medications: . aspirin EC  81 mg Oral Daily  . atorvastatin  80 mg Oral q1800  . digoxin  0.125 mg Oral Daily  . furosemide  40 mg Intravenous BID  . sodium chloride flush  3 mL Intravenous Q12H  . spironolactone  12.5 mg Oral QHS     Infusions: . sodium chloride    . heparin 1,000 Units/hr (02/14/18 1647)     PRN Medications:  sodium chloride, acetaminophen, nitroGLYCERIN, ondansetron (ZOFRAN) IV, sodium chloride flush  Patient Profile   Ricardo Brooks a 62 y.o.malewith a history of hypertension (currently not on any medications), bipolar disorder, ongoing tobacco abuse, chronic back and hip pain.    Admitted to [facility] 02/12/18 with SOB. No prior cardiac history. EKG with LVH and non-specific TWI laterally. Taken for cath which showed severe single vessel disease in ostial LAD with left-to-left and right-to-left collateral.    Assessment/Plan   1. Acute systolic CHF, ICM - Echo 02/14/18 with LVEF 20-25%, . Grade 2 DD, Mild MR, Mod LAE, RV normal, PA peak pressure 44 mmHG, suspected LV thrombus. Eugenie Birks 02/13/18 with EF 19% - RHC with moderately elevated filling pressures and low cardiac output. Mixed venous saturation 49%.  - Volume status mildly elevated.  - Lasix moved to this PM.  Will give dose 40 mg IV lasix x 1 after cMRI and follow response.  - Continue spiro 12.5 mg daily. - Continue digoxin 0.125 mg daily.  - Plan losartan 12.5 mg daily. BMET pending. SBP 106 this am.  - Hold BB with marginal/low cardiac index.  - Consider ARB for eventual consideration for Entresto.  - Would recommend cMRI for viability. Will discuss timing with MD.  - Consider Lifevest as below.   2. CAD  - LHC 02/14/18 with severe single-vessel coronary artery disease, including 99% chronic subtotal occlusion of the ostial LAD with TIMI-1 flow and left-to-left and right-to-left collaterals.  - Continue ASA and statin - cMRI this am - If viable, would recommend PCI attempt per Dr. Shirlee Latch  - Consider Lifevest on discharge with repeat Echo 3 months on GDMT for ICD consideration.   3. HTN - Meds as above.   4. Tobacco abuse - Encouraged complete cessation.   5. Bipolar disorder - PCP.   6. LV Thrombus - On heparin currently. Will need coumadin on discharge. Waiting to decide on need for procedure.   Medication concerns reviewed with patient and pharmacy team. Barriers identified: None at this time.   Length of Stay: 2  Ricardo Brooks  02/15/2018, 9:49 AM  Advanced Heart Failure Team Pager [phone] (M-F; 7a - 4p)  Please contact [facility] for night-coverage after hours (4p -7a ) and weekends on [email or web address]  Patient seen with PA, agree with the above note.  Echo yesterday showed EF 20-25% with LV thrombus.  He was started on heparin gtt.  By exam, volume is ok today.   Will give 1 dose of IV  Lasix today, transition to po tomorrow.  Add losartan 12.5 mg daily.  Continue spironolactone and digoxin.   He is going for MRI today to determine if LAD territory has viability.  If so, would favor PCI to LAD (Monday).  Will need Lifevest regardless.  ICD at 1 month if no PCI, repeat echo in 3 months for ICD if he does get PCI.   Ricardo Brooks 02/15/2018 12:12 PM

## 2018-02-15 NOTE — Progress Notes (Signed)
ANTICOAGULATION CONSULT NOTE   Pharmacy Consult for Heparin  Indication: LV thrombus  Allergies  Allergen Reactions  . Methocarbamol Nausea And Vomiting and Anxiety    Patient Measurements: Height: 5' 9.5" (176.5 cm) Weight: 132 lb 11.5 oz (60.2 kg) IBW/kg (Calculated) : 71.85  Vital Signs: Temp: 98.2 F (36.8 C) (03/14 1900) Temp Source: Oral (03/14 1900) BP: 118/65 (03/14 1900) Pulse Rate: 81 (03/14 1900)  Labs: Recent Labs    02/12/18 1032  02/12/18 1928 02/13/18 0012 02/13/18 0615 02/14/18 0404 02/15/18 0012  HGB 14.2  --  12.9*  --  13.0  --   --   HCT 44.4  --  39.5  --  40.1  --   --   PLT 188  --  174  --  185  --   --   LABPROT  --   --   --   --   --  13.9  --   INR  --   --   --   --   --  1.08  --   HEPARINUNFRC  --   --   --   --   --   --  0.33  CREATININE 0.91  --  0.89  --  0.97  --   --   TROPONINI  --    < > 0.13* 0.10* 0.10*  --   --    < > = values in this interval not displayed.    Estimated Creatinine Clearance: 69.8 mL/min (by C-G formula based on SCr of 0.97 mg/dL).   Medical History: Past Medical History:  Diagnosis Date  . Asthma   . Bipolar 1 disorder (HCC)   . Chronic back pain   . Hip pain, chronic   . Hypertension   . PTSD (post-traumatic stress disorder)     Assessment: 62 year old male to begin heparin for LV thrombus, no bolus given s/p cath, initial heparin level is therapeutic   Goal of Therapy:  Heparin level 0.3-0.7 units/ml Monitor platelets by anticoagulation protocol: Yes   Plan:  Heparin drip at 1000 units / hr Confirmatory heparin level with AM labs  Abran DukeJames Antoneo Ghrist, PharmD, BCPS Clinical Pharmacist Phone: [phone]

## 2018-02-16 LAB — BASIC METABOLIC PANEL
Anion gap: 10 (ref 5–15)
BUN: 16 mg/dL (ref 6–20)
CO2: 25 mmol/L (ref 22–32)
Calcium: 9.2 mg/dL (ref 8.9–10.3)
Chloride: 104 mmol/L (ref 101–111)
Creatinine, Ser: 1.01 mg/dL (ref 0.61–1.24)
GFR calc Af Amer: >60 mL/min (ref >60)
GLUCOSE: 100 mg/dL — AB (ref 65–99)
Potassium: 4.2 mmol/L (ref 3.5–5.1)
Sodium: 139 mmol/L (ref 135–145)

## 2018-02-16 LAB — CBC
HEMATOCRIT: 42.4 % (ref 39.0–52.0)
Hemoglobin: 13.6 g/dL (ref 13.0–17.0)
MCH: 29.7 pg (ref 26.0–34.0)
MCHC: 32.1 g/dL (ref 30.0–36.0)
MCV: 92.6 fL (ref 78.0–100.0)
Platelets: 193 10*3/uL (ref 150–400)
RBC: 4.58 MIL/uL (ref 4.22–5.81)
RDW: 13.9 % (ref 11.5–15.5)
WBC: 4.4 10*3/uL (ref 4.0–10.5)

## 2018-02-16 LAB — HEPARIN LEVEL (UNFRACTIONATED): Heparin Unfractionated: 0.37 IU/mL (ref 0.30–0.70)

## 2018-02-16 MED ORDER — WARFARIN SODIUM 7.5 MG PO TABS
7.5000 mg | ORAL_TABLET | Freq: Once | ORAL | Status: AC
  Administered 2018-02-16: 7.5 mg via ORAL
  Filled 2018-02-16: qty 1

## 2018-02-16 MED ORDER — CARVEDILOL 3.125 MG PO TABS
3.1250 mg | ORAL_TABLET | Freq: Two times a day (BID) | ORAL | Status: DC
  Administered 2018-02-16 – 2018-02-17 (×3): 3.125 mg via ORAL
  Filled 2018-02-16 (×3): qty 1

## 2018-02-16 MED ORDER — COUMADIN BOOK
Freq: Once | Status: DC
  Filled 2018-02-16 (×2): qty 1

## 2018-02-16 MED ORDER — WARFARIN - PHARMACIST DOSING INPATIENT
Freq: Every day | Status: DC
  Administered 2018-02-16 – 2018-02-17 (×2)

## 2018-02-16 MED ORDER — ENOXAPARIN SODIUM 60 MG/0.6ML ~~LOC~~ SOLN
1.0000 mg/kg | Freq: Two times a day (BID) | SUBCUTANEOUS | Status: DC
  Administered 2018-02-16 – 2018-02-17 (×3): 60 mg via SUBCUTANEOUS
  Filled 2018-02-16 (×3): qty 0.6

## 2018-02-16 NOTE — Progress Notes (Signed)
Patient ID: Sydnee CabalDavid Spadoni, male   DOB: February 14, 1956, 62 y.o.   MRN: [id number]     Advanced Heart Failure Rounding Note  PCP-Cardiologist: No primary care provider on file.   Subjective:    Echo 02/14/18 with LVEF 20-25%, . Grade 2 DD, Mild MR, Mod LAE, RV normal, PA peak pressure 44 mmHG + apical LV thrombus  Cardiac MRI: EF 20% with mild to moderate LV dilation, mildly decreased RV systolic function, significant LAD territory scar (does not appear viable).  LV apical thrombus.   No dyspnea or chest pain.  Feeling good this morning.   Objective:   Weight Range: 131 lb 13.4 oz (59.8 kg) Body mass index is 19.19 kg/m.   Vital Signs:   Temp:  [97.5 F (36.4 C)-98.1 F (36.7 C)] 98 F (36.7 C) (03/16 0439) Pulse Rate:  [54-97] 97 (03/16 0439) Resp:  [22] 22 (03/16 0439) BP: (94-124)/(55-104) 124/104 (03/16 0439) SpO2:  [97 %-100 %] 97 % (03/16 0439) Weight:  [131 lb 13.4 oz (59.8 kg)] 131 lb 13.4 oz (59.8 kg) (03/16 0439) Last BM Date: 02/16/18  Weight change: Filed Weights   02/14/18 0500 02/15/18 0512 02/16/18 0439  Weight: 132 lb 11.5 oz (60.2 kg) 133 lb 4.8 oz (60.5 kg) 131 lb 13.4 oz (59.8 kg)   Intake/Output:   Intake/Output Summary (Last 24 hours) at 02/16/2018 0936 Last data filed at 02/16/2018 0600 Gross per 24 hour  Intake 1703 ml  Output 1000 ml  Net 703 ml   Physical Exam    General: NAD Neck: No JVD, no thyromegaly or thyroid nodule.  Lungs: Clear to auscultation bilaterally with normal respiratory effort. CV: Nondisplaced PMI.  Heart regular S1/S2, no S3/S4, no murmur.  No peripheral edema.  No carotid bruit.  Normal pedal pulses.  Abdomen: Soft, nontender, no hepatosplenomegaly, no distention.  Skin: Intact without lesions or rashes.  Neurologic: Alert and oriented x 3.  Psych: Normal affect. Extremities: No clubbing or cyanosis.  HEENT: Normal.   Telemetry   NSR, personally reviewed.   EKG    No new tracings.    Labs    CBC Recent Labs   02/15/18 0600 02/16/18 0258  WBC 4.5 4.4  HGB 13.0 13.6  HCT 40.2 42.4  MCV 91.8 92.6  PLT 192 193   Basic Metabolic Panel Recent Labs    51/88/4103/15/19 0600 02/16/18 0258  NA 137 139  K 4.1 4.2  CL 105 104  CO2 21* 25  GLUCOSE 105* 100*  BUN 19 16  CREATININE 0.96 1.01  CALCIUM 8.6* 9.2   Liver Function Tests No results for input(s): AST, ALT, ALKPHOS, BILITOT, PROT, ALBUMIN in the last 72 hours. No results for input(s): LIPASE, AMYLASE in the last 72 hours. Cardiac Enzymes No results for input(s): CKTOTAL, CKMB, CKMBINDEX, TROPONINI in the last 72 hours. BNP: BNP (last 3 results) Recent Labs    02/12/18 1114  BNP 633.4*   ProBNP (last 3 results) No results for input(s): PROBNP in the last 8760 hours.  D-Dimer No results for input(s): DDIMER in the last 72 hours. Hemoglobin A1C No results for input(s): HGBA1C in the last 72 hours. Fasting Lipid Panel No results for input(s): CHOL, HDL, LDLCALC, TRIG, CHOLHDL, LDLDIRECT in the last 72 hours. Thyroid Function Tests No results for input(s): TSH, T4TOTAL, T3FREE, THYROIDAB in the last 72 hours.  Invalid input(s): FREET3 Other results:  Imaging   Mr Cardiac Morphology W Wo Contrast  Result Date: 02/15/2018 CLINICAL DATA:  Ischemic cardiomyopathy,  assess for viability. EXAM: CARDIAC MRI TECHNIQUE: The patient was scanned on a 1.5 Tesla GE magnet. A dedicated cardiac coil was used. Functional imaging was done using Fiesta sequences. 2,3, and 4 chamber views were done to assess for RWMA's. Modified Simpson's rule using a short axis stack was used to calculate an ejection fraction on a dedicated work [employer]. The patient received 30 cc of Multihance. After 10 minutes inversion recovery sequences were used to assess for infiltration and scar tissue. FINDINGS: Limited images of the lung fields showed no significant abnormalities. Mild to moderate left ventricular dilation with normal wall thickness. There  was a layered apical thrombus. LV EF 20%. The anteroseptal wall and the anterior wall were akinetic. The true apex, the apical lateral wall, and the apical inferior wall were akinetic. The basal to mid anterolateral wall was moderately hypokinetic. Normal right ventricular size with mildly decreased systolic function. Mild left atrial enlargement. Normal right atrium. Trileaflet aortic vavle with no stenosis or regurgitation. Mild mitral regurgitation. Delayed enhancement: 76-99% wall thickness subendocardial late gadolinium enhancement (LGE) in the mid to apical anteroseptal and anterior walls, the apical inferior wall, the apical lateral wall, and the true apex. Measurements: LVEDV 229 mL LV SV 45 mL LV EF 20% IMPRESSION: 1. Mild to moderate LV dilation with normal wall thickness. EF 20% with wall motion abnormalities as noted above. There was a layered LV apical thrombus. 2.  Normal RV size with mildly decreased systolic function. 3. Delayed enhancement images show significant scarring in the LAD territory. I suspect that the mid to apical anterior/anteroseptal walls, the apical inferior wall, the apical lateral wall, and the true apex are not viable. Takahiro Godinho Electronically Signed   By: Marca Ancona M.D.   On: 02/15/2018 14:43    Medications:    Scheduled Medications: . ARIPiprazole  7.5 mg Oral Daily  . aspirin EC  81 mg Oral Daily  . atorvastatin  80 mg Oral q1800  . carvedilol  3.125 mg Oral BID WC  . digoxin  0.125 mg Oral Daily  . furosemide  40 mg Oral Daily  . losartan  12.5 mg Oral Daily  . sodium chloride flush  3 mL Intravenous Q12H  . spironolactone  12.5 mg Oral QHS    Infusions: . sodium chloride    . heparin 1,000 Units/hr (02/15/18 1652)    PRN Medications: sodium chloride, acetaminophen, nitroGLYCERIN, ondansetron (ZOFRAN) IV, sodium chloride flush  Patient Profile   Ojas Coone a 62 y.o.malewith a history of hypertension (currently not on any medications),  bipolar disorder, ongoing tobacco abuse, chronic back and hip pain.    Admitted to [facility] 02/12/18 with SOB. No prior cardiac history. EKG with LVH and non-specific TWI laterally. Taken for cath which showed severe single vessel disease in ostial LAD with left-to-left and right-to-left collateral.   Assessment/Plan   1. Acute systolic CHF, ICM:  Echo 02/14/18 with LVEF 20-25%, . Grade 2 DD, Mild MR, Mod LAE, RV normal, PA peak pressure 44 mmHG, suspected LV thrombus. cMRI with EF 20%, LV thrombus, LAD territory scar that does not appear viable.  RHC with moderately elevated filling pressures and low cardiac output. Mixed venous saturation 49%. Today, he does not look volume overloaded on exam.  Soft SBP 90s-100s but denies lightheadedness.  - Continue Lasix 40 mg daily.  - Continue spiro 12.5 mg daily and losartan 12.5 mg daily. - Continue digoxin 0.125 mg daily.  - Add low dose Coreg 3.125 mg  bid today.  2. CAD: LHC 02/14/18 with severe single-vessel coronary artery disease, including 99% chronic subtotal occlusion of the ostial LAD with TIMI-1 flow and left-to-left and right-to-left collaterals.  Given collaterals, he does not appear to have presented with an acute event and PCI was not done.  Presentation seems to have been due to CHF.  Cardiac MRI shows extensive LAD territory scar, I do not think that the LAD territory has significant viability.  - Would defer PCI with minimal viability.  - Continue ASA and statin - Will arrange for Lifevest on discharge.  Repeat echo in 1 month given no revascularization and ICD if EF remains low.  3. Tobacco abuse: Encouraged complete cessation.  4. Bipolar disorder 5. LV Thrombus: Heparin gtt, adding warfarin today since no PCI.  Will need long-term warfarin.  Will need coumadin clinic.  Will see if we can send him home perhaps tomorrow with Lovenox bridge.   Length of Stay: 3  Marca Ancona, MD  02/16/2018, 9:36 AM  Advanced Heart Failure Team Pager  [phone] (M-F; 7a - 4p)  Please contact [facility] for night-coverage after hours (4p -7a ) and weekends on [email or web address]

## 2018-02-16 NOTE — Progress Notes (Addendum)
ANTICOAGULATION CONSULT NOTE   Pharmacy Consult for Heparin/warfarin (D1 overlap) Indication: LV thrombus  Allergies  Allergen Reactions  . Methocarbamol Nausea And Vomiting and Anxiety    Patient Measurements: Height: 5' 9.5" (176.5 cm) Weight: 131 lb 13.4 oz (59.8 kg) IBW/kg (Calculated) : 71.85  Vital Signs: Temp: 98 F (36.7 C) (03/16 0439) Temp Source: Oral (03/16 0439) BP: 124/104 (03/16 0439) Pulse Rate: 97 (03/16 0439)  Labs: Recent Labs    02/14/18 0404 02/15/18 0012 02/15/18 0600 02/16/18 0258  HGB  --   --  13.0 13.6  HCT  --   --  40.2 42.4  PLT  --   --  192 193  LABPROT 13.9  --   --   --   INR 1.08  --   --   --   HEPARINUNFRC  --  0.33 0.38 0.37  CREATININE  --   --  0.96 1.01    Estimated Creatinine Clearance: 66.6 mL/min (by C-G formula based on SCr of 1.01 mg/dL).   Medical History: Past Medical History:  Diagnosis Date  . Asthma   . Bipolar 1 disorder (HCC)   . Chronic back pain   . Hip pain, chronic   . Hypertension   . PTSD (post-traumatic stress disorder)     Assessment: 62 year old male to begin heparin for LV thrombus, no bolus given s/p cath.   Initial heparin level was therapeutic at 0.33, confirmatory level 5 hours later remained therapeutic at 0.38, on 1000 units/hr. Continues to be therapeutic 0.37. CBC is stable. No signs/symptoms of bleeding. No infusion issues.    Goal of Therapy:  Heparin level 0.3-0.7 units/ml Monitor platelets by anticoagulation protocol: Yes   Plan:  Continue heparin drip at 1000 units / hr Monitor daily HL and CBC Monitor signs/symptoms of bleeding  Addendum: Patient may be discharged tomorrow. No PCI per MD, consulted pharmacy to start warfarin tonight with lovenox. Notified RN about the plan  Plan: Start Warfarin 7.5 mg PO x 1 per protocol Lovenox 60 mg (1 mg/kg) [place] q12h (D1 overlap) Discontinue heparin and start lovenox 1 hour after. Daily INR, CBCs F/u s/sx bleeding, PO intake,  DDI   Donnella Biyler Jerilee Space, PharmD PGY1 Acute Care Pharmacy Resident Pager: [phone] 02/16/2018 8:48 AM

## 2018-02-16 NOTE — Progress Notes (Signed)
02/16/2018 Patient Heart rate was 58 and Cardiology was called concerning Coreg per MD go ahead and give medication unless it goes below the 50's call cardiology. [facility] Shoshana Johal RN.

## 2018-02-17 DIAGNOSIS — I240 Acute coronary thrombosis not resulting in myocardial infarction: Secondary | ICD-10-CM

## 2018-02-17 DIAGNOSIS — M549 Dorsalgia, unspecified: Secondary | ICD-10-CM

## 2018-02-17 DIAGNOSIS — I513 Intracardiac thrombosis, not elsewhere classified: Secondary | ICD-10-CM

## 2018-02-17 DIAGNOSIS — F319 Bipolar disorder, unspecified: Secondary | ICD-10-CM | POA: Diagnosis present

## 2018-02-17 DIAGNOSIS — G8929 Other chronic pain: Secondary | ICD-10-CM | POA: Diagnosis present

## 2018-02-17 DIAGNOSIS — I10 Essential (primary) hypertension: Secondary | ICD-10-CM | POA: Diagnosis present

## 2018-02-17 DIAGNOSIS — I2510 Atherosclerotic heart disease of native coronary artery without angina pectoris: Secondary | ICD-10-CM

## 2018-02-17 LAB — BASIC METABOLIC PANEL
Anion gap: 8 (ref 5–15)
BUN: 14 mg/dL (ref 6–20)
CALCIUM: 8.9 mg/dL (ref 8.9–10.3)
CHLORIDE: 105 mmol/L (ref 101–111)
CO2: 24 mmol/L (ref 22–32)
CREATININE: 0.94 mg/dL (ref 0.61–1.24)
GFR calc Af Amer: >60 mL/min (ref >60)
Glucose, Bld: 93 mg/dL (ref 65–99)
Potassium: 4.6 mmol/L (ref 3.5–5.1)
SODIUM: 137 mmol/L (ref 135–145)

## 2018-02-17 LAB — CBC
HEMATOCRIT: 41.2 % (ref 39.0–52.0)
HEMOGLOBIN: 13.3 g/dL (ref 13.0–17.0)
MCH: 29.9 pg (ref 26.0–34.0)
MCHC: 32.3 g/dL (ref 30.0–36.0)
MCV: 92.6 fL (ref 78.0–100.0)
Platelets: 188 10*3/uL (ref 150–400)
RBC: 4.45 MIL/uL (ref 4.22–5.81)
RDW: 13.8 % (ref 11.5–15.5)
WBC: 4.2 10*3/uL (ref 4.0–10.5)

## 2018-02-17 LAB — HEPARIN LEVEL (UNFRACTIONATED): HEPARIN UNFRACTIONATED: 0.64 [IU]/mL (ref 0.30–0.70)

## 2018-02-17 LAB — PROTIME-INR
INR: 1.14
PROTHROMBIN TIME: 14.5 s (ref 11.4–15.2)

## 2018-02-17 MED ORDER — LOSARTAN POTASSIUM 25 MG PO TABS: 25.0000 mg | ORAL_TABLET | Freq: Every day | ORAL | Status: DC

## 2018-02-17 MED ORDER — ENOXAPARIN SODIUM 60 MG/0.6ML ~~LOC~~ SOLN: 1.0000 mg/kg | Freq: Two times a day (BID) | SUBCUTANEOUS | 1 refills | Status: DC

## 2018-02-17 MED ORDER — DIGOXIN 125 MCG PO TABS: 0.1250 mg | ORAL_TABLET | Freq: Every day | ORAL | 3 refills | Status: DC

## 2018-02-17 MED ORDER — CARVEDILOL 3.125 MG PO TABS: 3.1250 mg | ORAL_TABLET | Freq: Two times a day (BID) | ORAL | 6 refills | Status: DC

## 2018-02-17 MED ORDER — LOSARTAN POTASSIUM 25 MG PO TABS
12.5000 mg | ORAL_TABLET | Freq: Once | ORAL | Status: AC
  Administered 2018-02-17: 12.5 mg via ORAL
  Filled 2018-02-17: qty 1

## 2018-02-17 MED ORDER — WARFARIN SODIUM 5 MG PO TABS: 5.0000 mg | ORAL_TABLET | Freq: Once | ORAL | 1 refills | Status: DC

## 2018-02-17 MED ORDER — ENOXAPARIN (LOVENOX) PATIENT EDUCATION KIT
PACK | Freq: Once | Status: AC
  Administered 2018-02-17: 13:00:00
  Filled 2018-02-17: qty 1

## 2018-02-17 MED ORDER — NITROGLYCERIN 0.4 MG SL SUBL: 0.4000 mg | SUBLINGUAL_TABLET | SUBLINGUAL | 1 refills | Status: AC | PRN

## 2018-02-17 MED ORDER — FUROSEMIDE 40 MG PO TABS: 40.0000 mg | ORAL_TABLET | Freq: Every day | ORAL | 3 refills | Status: DC

## 2018-02-17 MED ORDER — ASPIRIN 81 MG PO TBEC: 81.0000 mg | DELAYED_RELEASE_TABLET | Freq: Every day | ORAL | 3 refills | Status: AC

## 2018-02-17 MED ORDER — WARFARIN SODIUM 7.5 MG PO TABS
7.5000 mg | ORAL_TABLET | Freq: Once | ORAL | Status: AC
  Administered 2018-02-17: 7.5 mg via ORAL
  Filled 2018-02-17: qty 1

## 2018-02-17 MED ORDER — ATORVASTATIN CALCIUM 80 MG PO TABS: 80.0000 mg | ORAL_TABLET | Freq: Every day | ORAL | 3 refills | Status: DC

## 2018-02-17 MED ORDER — SPIRONOLACTONE 25 MG PO TABS: 12.5000 mg | ORAL_TABLET | Freq: Every day | ORAL | 3 refills | Status: DC

## 2018-02-17 MED ORDER — LOSARTAN POTASSIUM 25 MG PO TABS: 25.0000 mg | ORAL_TABLET | Freq: Every day | ORAL | 3 refills | Status: DC

## 2018-02-17 NOTE — Progress Notes (Signed)
02/17/2018 Patient discharge was cancel, because patient was unable to get Lovenox for home. Heart failure to provide Lovenox from the hear failure funds tomorrow. [facility] Arthurs RN.

## 2018-02-17 NOTE — Progress Notes (Signed)
ANTICOAGULATION CONSULT NOTE   Pharmacy Consult for warfarin (D4 overlap) Indication: LV thrombus  Allergies  Allergen Reactions  . Methocarbamol Nausea And Vomiting and Anxiety    Patient Measurements: Height: 5' 9.5" (176.5 cm) Weight: 136 lb 11 oz (62 kg) IBW/kg (Calculated) : 71.85  Vital Signs: Temp: 97.6 F (36.4 C) (03/17 0516) Temp Source: Oral (03/17 0516) BP: 107/82 (03/17 0516) Pulse Rate: 64 (03/17 0516)  Labs: Recent Labs    02/15/18 0600 02/16/18 0258 02/17/18 0236  HGB 13.0 13.6 13.3  HCT 40.2 42.4 41.2  PLT 192 193 188  LABPROT  --   --  14.5  INR  --   --  1.14  HEPARINUNFRC 0.38 0.37 0.64  CREATININE 0.96 1.01 0.94    Estimated Creatinine Clearance: 74.2 mL/min (by C-G formula based on SCr of 0.94 mg/dL).   Medical History: Past Medical History:  Diagnosis Date  . Asthma   . Bipolar 1 disorder (HCC)   . Chronic back pain   . Hip pain, chronic   . Hypertension   . PTSD (post-traumatic stress disorder)     Assessment: 62 year old male presenting with LV thrombus. Was receiving heparin until 3/16 when he was switched to overlap of lovenox and warfarin. Currently day 4 of overlap with systemic anticoagulant (including heparin), but day 2 with lovenox.  CBC stable with no signs/sx bleeding documented, INR subtherapeutic at 1.14. Continue to overlap with lovenox, especially at discharge.  Goal of Therapy:  INR 2-3  Plan: Warfarin 7.5 mg PO x 1 Lovenox 60 mg (1 mg/kg) [place] q12h (D2 overlap) Daily INR, CBCs F/u s/sx bleeding, PO intake, DDI   Donnella Biyler Ginelle Bays, PharmD PGY1 Acute Care Pharmacy Resident Pager: [phone] 02/17/2018 7:43 AM

## 2018-02-17 NOTE — Progress Notes (Signed)
Patient ID: Ricardo Brooks, male   DOB: 09-11-1956, 62 y.o.   MRN: [id number]     Advanced Heart Failure Rounding Note  PCP-Cardiologist: No primary care provider on file.   Subjective:    Echo 02/14/18 with LVEF 20-25%, . Grade 2 DD, Mild MR, Mod LAE, RV normal, PA peak pressure 44 mmHG + apical LV thrombus  Cardiac MRI: EF 20% with mild to moderate LV dilation, mildly decreased RV systolic function, significant LAD territory scar (does not appear viable).  LV apical thrombus.   No complaints today, no dyspnea or chest pain.   Objective:   Weight Range: 136 lb 11 oz (62 kg) Body mass index is 19.9 kg/m.   Vital Signs:   Temp:  [97.6 F (36.4 C)-98.2 F (36.8 C)] 98.1 F (36.7 C) (03/17 0920) Pulse Rate:  [57-64] 64 (03/17 0516) Resp:  [20] 20 (03/17 0920) BP: (95-113)/(49-82) 113/63 (03/17 0927) SpO2:  [98 %-99 %] 98 % (03/17 0927) Weight:  [136 lb 11 oz (62 kg)] 136 lb 11 oz (62 kg) (03/17 0516) Last BM Date: 02/17/18  Weight change: Filed Weights   02/15/18 0512 02/16/18 0439 02/17/18 0516  Weight: 133 lb 4.8 oz (60.5 kg) 131 lb 13.4 oz (59.8 kg) 136 lb 11 oz (62 kg)   Intake/Output:   Intake/Output Summary (Last 24 hours) at 02/17/2018 1023 Last data filed at 02/16/2018 2100 Gross per 24 hour  Intake 245 ml  Output -  Net 245 ml   Physical Exam    General: NAD Neck: No JVD, no thyromegaly or thyroid nodule.  Lungs: Clear to auscultation bilaterally with normal respiratory effort. CV: Nondisplaced PMI.  Heart regular S1/S2, no S3/S4, no murmur.  No peripheral edema.  No carotid bruit.  Normal pedal pulses.  Abdomen: Soft, nontender, no hepatosplenomegaly, no distention.  Skin: Intact without lesions or rashes.  Neurologic: Alert and oriented x 3.  Psych: Normal affect. Extremities: No clubbing or cyanosis.  HEENT: Normal.   Telemetry   NSR, personally reviewed.   EKG    No new tracings.    Labs    CBC Recent Labs    02/16/18 0258 02/17/18 0236    WBC 4.4 4.2  HGB 13.6 13.3  HCT 42.4 41.2  MCV 92.6 92.6  PLT 193 188   Basic Metabolic Panel Recent Labs    95/62/13 0258 02/17/18 0236  NA 139 137  K 4.2 4.6  CL 104 105  CO2 25 24  GLUCOSE 100* 93  BUN 16 14  CREATININE 1.01 0.94  CALCIUM 9.2 8.9   Liver Function Tests No results for input(s): AST, ALT, ALKPHOS, BILITOT, PROT, ALBUMIN in the last 72 hours. No results for input(s): LIPASE, AMYLASE in the last 72 hours. Cardiac Enzymes No results for input(s): CKTOTAL, CKMB, CKMBINDEX, TROPONINI in the last 72 hours. BNP: BNP (last 3 results) Recent Labs    02/12/18 1114  BNP 633.4*   ProBNP (last 3 results) No results for input(s): PROBNP in the last 8760 hours.  D-Dimer No results for input(s): DDIMER in the last 72 hours. Hemoglobin A1C No results for input(s): HGBA1C in the last 72 hours. Fasting Lipid Panel No results for input(s): CHOL, HDL, LDLCALC, TRIG, CHOLHDL, LDLDIRECT in the last 72 hours. Thyroid Function Tests No results for input(s): TSH, T4TOTAL, T3FREE, THYROIDAB in the last 72 hours.  Invalid input(s): FREET3 Other results:  Imaging   No results found.  Medications:    Scheduled Medications: . ARIPiprazole  7.5 mg  Oral Daily  . aspirin EC  81 mg Oral Daily  . atorvastatin  80 mg Oral q1800  . carvedilol  3.125 mg Oral BID WC  . coumadin book   Does not apply Once  . digoxin  0.125 mg Oral Daily  . enoxaparin (LOVENOX) injection  1 mg/kg Subcutaneous Q12H  . furosemide  40 mg Oral Daily  . losartan  12.5 mg Oral Once  . [START ON 02/18/2018] losartan  25 mg Oral Daily  . sodium chloride flush  3 mL Intravenous Q12H  . spironolactone  12.5 mg Oral QHS  . warfarin  7.5 mg Oral ONCE-1800  . Warfarin - Pharmacist Dosing Inpatient   Does not apply q1800    Infusions: . sodium chloride      PRN Medications: sodium chloride, acetaminophen, nitroGLYCERIN, ondansetron (ZOFRAN) IV, sodium chloride flush  Patient Profile   Ricardo BrimDavid  Brooks a 62 y.o.malewith a history of hypertension (currently not on any medications), bipolar disorder, ongoing tobacco abuse, chronic back and hip pain.    [facility] with SOB. No prior cardiac history. EKG with LVH and non-specific TWI laterally. Taken for cath which showed severe single vessel disease in ostial LAD with left-to-left and right-to-left collateral.   Assessment/Plan   1. Acute systolic CHF, ICM:  Echo 02/14/18 with LVEF 20-25%, . Grade 2 DD, Mild MR, Mod LAE, RV normal, PA peak pressure 44 mmHG, suspected LV thrombus. cMRI with EF 20%, LV thrombus, LAD territory scar that does not appear viable.  RHC with moderately elevated filling pressures and low cardiac output. Mixed venous saturation 49%. Today, he does not look volume overloaded on exam.  BP stable.   - Continue Lasix 40 mg daily.  - Continue spiro 12.5 mg daily and increase losartan to 25 mg daily. - Continue digoxin 0.125 mg daily.  - Continue Coreg 3.125 mg bid.  2. CAD: LHC 02/14/18 with severe single-vessel coronary artery disease, including 99% chronic subtotal occlusion of the ostial LAD with TIMI-1 flow and left-to-left and right-to-left collaterals.  Given collaterals, he does not appear to have presented with an acute event and PCI was not done.  Presentation seems to have been due to CHF.  Cardiac MRI shows extensive LAD territory scar, I do not think that the LAD territory has significant viability.  - Would defer PCI with minimal viability.  - Continue ASA and statin - Will arrange for Lifevest on discharge.  Repeat echo in 1 month given no revascularization and ICD if EF remains low.  3. Tobacco abuse: Encouraged complete cessation.  4. Bipolar disorder 5. LV Thrombus: On Lovenox bridge to therapeutic warfarin.  Will need long-term warfarin.  Will need coumadin clinic.   6. Disposition: Home today with Lifevest.  He will need followup in coumadin clinic this week and in CHF clinic in 10 days.   Will need meds explained to him again before discharge.  Cardiac meds for home: Lovenox bridge to therapeutic INR on coumadin (follow in coumadin clinic), ASA 81 until INR therapeutic then can stop, atorvastatin 80 daily, digoxin 0.125 daily, Coreg 3.125 mg bid, losartan 25 daily, spironolactone 12.5 daily.   Length of Stay: 4  Marca Anconaalton McLean, MD  02/17/2018, 10:23 AM  Advanced Heart Failure Team Pager [phone] (M-F; 7a - 4p)  Please contact [facility] for night-coverage after hours (4p -7a ) and weekends on [email or web address]

## 2018-02-17 NOTE — Care Management (Signed)
Verified with [facility] that patient's Medicaid will cover Lovenox.  Pt was fitted for Lifevest last night and is ready for d/c.  PA, Bjorn Loserhonda, notified and nurse

## 2018-02-17 NOTE — Progress Notes (Signed)
02/17/2018 Patient receive Coumadin 7.5 mg before leaving the hospital on 02/17/17.  He was told not to take any Coumadin tonight. Take Coumadin 53m at 6 pm on 02/18/2018. [facility] He was told several time to make sure he gets INR checked on Tuesday or Wednesday . To follow with Dr BGwenlyn [facility]iology in one week. The AVS was read to him about not driving for one week, about diet and about heart failure. He was also told about medication he needed to take when he goes home and what to take tomorrow. He was also given Lovenox Kit and also though how to give self Lovenox. [facility].

## 2018-02-17 NOTE — Discharge Summary (Signed)
Discharge Summary    Patient ID: Ricardo Brooks,  MRN: [id number], DOB/AGE: [birth year] 60 y.o.  Admit date: 02/12/2018 Discharge date: 02/17/2018   Primary Care Provider: Gilda Crease Primary Cardiologist: Dr. Allyson Sabal  Discharge Diagnoses    Principal Problem:   Acute systolic heart failure [facility]) Active Problems:   SOB (shortness of breath)   Elevated troponin   CAD (coronary artery disease)   LV (left ventricular) mural thrombus without MI   Hypertension   Chronic back pain   Bipolar 1 disorder (HCC)   Allergies Allergies  Allergen Reactions  . Methocarbamol Nausea And Vomiting and Anxiety     History of Present Illness     Ricardo Brooks is a 62 y.o. male with a history of hypertension (currently not on any medications), bipolar disorder, ongoing tobacco abuse, chronic back and hip pain  who was seen for the evaluation of shortness of breath.  No prior cardiac history.  Currently smokes less than half a pack a day.  Denies illicit drug use.  Mr. Dimmick was in usual state of health up until a few days ago when he has noted shortness of breath with activity.  He has chronic dyspnea on exertion due to ongoing tobacco smoking.  However noted worsening dyspnea with fatigue on ambulation.  He also noted intermittent palpitation with shortness of breath.  He is not taking any antihypertensives since last fall.  Says it was discontinued by PCP due to normalization of blood pressure.  He is noncompliant with his bipolar medication.  He has intermittent orthopnea without PND, dizziness, lower extremity edema or melena.  He says he walks a lot however unable to do so in past few days due to worsening shortness of breath.  In ER, BNP 633.  Point-of-care troponin is 0.16.  Troponin I 0.1.  Electrolyte and serum creatinine normal.  EKG shows sinus rhythm at rate of 87 bpm, bilateral atrial enlargement, LVH  and nonspecific T wave inversion in lateral leads-personally reviewed.  CT  angio of chest: IMPRESSION: 1. No pulmonary emboli. 2. Cardiomegaly with small bilateral pleural effusions. 3. Nonspecific mediastinal adenopathy.   Hospital Course     Consultants: none  Acute systolic congestive heart failure Echocardiogram with reduced left ventricular ejection fraction of 20-25%, diffuse hypokinesis, akinesis of the anterior septal wall and apex.  Grade 2 diastolic dysfunction.  PA peak pressure was 44 mmHg.  Mild MR, moderate LAE, RV normal.  Continue aspirin until INR therapeutic, then stop aspirin.  Continue Lipitor 80 mg, digoxin 0.125 daily, Coreg 3.125 mg twice daily, losartan 25 mg daily, spironolactone 12.5 mg daily.  Discharged on 40 mg Lasix.  No potassium supplement given in the setting of spironolactone.  sCr on discharge is 0.94 and K was 4.6 on lasix without potassium supplementation. Repeat BMP at outpatient clinic visit.   Ischemic cardiomyopathy, CAD LHC 02/14/18 with severe single-vessel di echocardiogram with reduced left ventricular ejection fraction of 20-25% with diffuse hypokinesis and akinesis of the anterior septal Sease with 99% chronic subtotal occlusion of the ostial LAD with TIMI-1 flow and left-to-left and right-to-left collaterals.  Given collaterals have low suspicion for an acute event and PCI was not done.  Presentation seems to be consistent with CHF.  Cardiac MRI shows extensive LAD territory scar.  Per Dr. Sherlie Ban, do not think the LAD territory has significant viability.  Will defer PCI with minimal viability.  Continue aspirin and statin. He was fitted with a LifeVest before discharge.  We  will plan to repeat an echocardiogram in 1 month for ICD consideration.  LV thrombus on echo Patient will undergo a Lovenox bridge to therapeutic Coumadin.  He will need long-term Coumadin therapy.  INR goal 2.0-3.0.  Continue aspirin 81 mg until INR is therapeutic, then may stop aspirin.  Regimen as above.  Message sent to [facility] anticoagulation  pool for INR check on Tuesday or Wednesday of next week.  He received one dose of 7.5 mg coumadin, discharge INR was 1.14. Discharged on 5 mg Coumadin daily.  Tobacco use Encouraged cessation  Patient seen and examined by Dr. Shirlee Latch today and was stable for discharge. All follow up has been arranged.  _____________  Discharge Vitals Blood pressure 113/63, pulse 64, temperature 98.1 F (36.7 C), temperature source Oral, resp. rate 20, height 5' 9.5" (1.765 m), weight 136 lb 11 oz (62 kg), SpO2 98 %.  Filed Weights   02/15/18 0512 02/16/18 0439 02/17/18 0516  Weight: 133 lb 4.8 oz (60.5 kg) 131 lb 13.4 oz (59.8 kg) 136 lb 11 oz (62 kg)    Labs & Radiologic Studies    CBC Recent Labs    02/16/18 0258 02/17/18 0236  WBC 4.4 4.2  HGB 13.6 13.3  HCT 42.4 41.2  MCV 92.6 92.6  PLT 193 188   Basic Metabolic Panel Recent Labs    81/19/14 0258 02/17/18 0236  NA 139 137  K 4.2 4.6  CL 104 105  CO2 25 24  GLUCOSE 100* 93  BUN 16 14  CREATININE 1.01 0.94  CALCIUM 9.2 8.9   Liver Function Tests No results for input(s): AST, ALT, ALKPHOS, BILITOT, PROT, ALBUMIN in the last 72 hours. No results for input(s): LIPASE, AMYLASE in the last 72 hours. Cardiac Enzymes No results for input(s): CKTOTAL, CKMB, CKMBINDEX, TROPONINI in the last 72 hours. BNP Invalid input(s): POCBNP D-Dimer No results for input(s): DDIMER in the last 72 hours. Hemoglobin A1C No results for input(s): HGBA1C in the last 72 hours. Fasting Lipid Panel No results for input(s): CHOL, HDL, LDLCALC, TRIG, CHOLHDL, LDLDIRECT in the last 72 hours. Thyroid Function Tests No results for input(s): TSH, T4TOTAL, T3FREE, THYROIDAB in the last 72 hours.  Invalid input(s): FREET3 _____________  Dg Chest 2 View  Result Date: 02/12/2018 CLINICAL DATA:  Shortness of Breath EXAM: CHEST - 2 VIEW COMPARISON:  None. FINDINGS: There is mild scarring in the left lower lobe. There is no edema or consolidation. The heart size  and pulmonary vascularity are normal. No adenopathy. No bone lesions. IMPRESSION: Mild scarring left lower lobe.  No edema or consolidation. Electronically Signed   By: Bretta Bang III M.D.   On: 02/12/2018 10:44   Ct Angio Chest Pe W And/or Wo Contrast  Result Date: 02/12/2018 CLINICAL DATA:  Shortness of breath. EXAM: CT ANGIOGRAPHY CHEST WITH CONTRAST TECHNIQUE: Multidetector CT imaging of the chest was performed using the standard protocol during bolus administration of intravenous contrast. Multiplanar CT image reconstructions and MIPs were obtained to evaluate the vascular anatomy. CONTRAST:  ISOVUE-370 IOPAMIDOL (ISOVUE-370) INJECTION 76% COMPARISON:  Chest x-ray dated 02/12/2018 FINDINGS: Cardiovascular: Satisfactory opacification of the pulmonary arteries to the segmental level. No evidence of pulmonary embolism. Cardiomegaly. No pericardial effusion. Mediastinum/Nodes: Slightly prominent nonspecific mediastinal lymph nodes including a 14 mm node in the aortopulmonary window and a 14 mm precarinal lymph node. Trachea and esophagus demonstrate no significant findings. Lungs/Pleura: Emphysematous changes in the upper lobes. Small bilateral pleural effusions, right greater than left. Peribronchial  thickening bilaterally. No infiltrates. Upper Abdomen: No acute abnormality. Musculoskeletal: No chest wall abnormality. No acute or significant osseous findings. Review of the MIP images confirms the above findings. IMPRESSION: 1. No pulmonary emboli. 2. Cardiomegaly with small bilateral pleural effusions. 3. Nonspecific mediastinal adenopathy. Electronically Signed   By: Francene Boyers M.D.   On: 02/12/2018 12:18   Nm Myocar Multi W/spect W/wall Motion / Ef  Result Date: 02/13/2018  There was no ST segment deviation noted during stress.  Defect 1: There is a large defect of severe severity present in the basal anteroseptal, mid anteroseptal, mid inferoseptal, mid inferior, apical anterior,  apical septal, apical inferior, apical lateral and apex location.  This is a high risk study.  Findings consistent with prior myocardial infarction. No ischemia.  Nuclear stress EF: 19%. Severe generalized hypokinesis.  The left ventricular ejection fraction is severely decreased (<30%).  Donato Schultz, MD   Mr Cardiac Morphology W Wo Contrast  Result Date: 02/15/2018 CLINICAL DATA:  Ischemic cardiomyopathy, assess for viability. EXAM: CARDIAC MRI TECHNIQUE: The patient was scanned on a 1.5 Tesla GE magnet. A dedicated cardiac coil was used. Functional imaging was done using Fiesta sequences. 2,3, and 4 chamber views were done to assess for RWMA's. Modified Simpson's rule using a short axis stack was used to calculate an ejection fraction on a dedicated work [employer]. The patient received 30 cc of Multihance. After 10 minutes inversion recovery sequences were used to assess for infiltration and scar tissue. FINDINGS: Limited images of the lung fields showed no significant abnormalities. Mild to moderate left ventricular dilation with normal wall thickness. There was a layered apical thrombus. LV EF 20%. The anteroseptal wall and the anterior wall were akinetic. The true apex, the apical lateral wall, and the apical inferior wall were akinetic. The basal to mid anterolateral wall was moderately hypokinetic. Normal right ventricular size with mildly decreased systolic function. Mild left atrial enlargement. Normal right atrium. Trileaflet aortic vavle with no stenosis or regurgitation. Mild mitral regurgitation. Delayed enhancement: 76-99% wall thickness subendocardial late gadolinium enhancement (LGE) in the mid to apical anteroseptal and anterior walls, the apical inferior wall, the apical lateral wall, and the true apex. Measurements: LVEDV 229 mL LV SV 45 mL LV EF 20% IMPRESSION: 1. Mild to moderate LV dilation with normal wall thickness. EF 20% with wall motion abnormalities as noted  above. There was a layered LV apical thrombus. 2.  Normal RV size with mildly decreased systolic function. 3. Delayed enhancement images show significant scarring in the LAD territory. I suspect that the mid to apical anterior/anteroseptal walls, the apical inferior wall, the apical lateral wall, and the true apex are not viable. Dalton Mclean Electronically Signed   By: Marca Ancona M.D.   On: 02/15/2018 14:43     Diagnostic Studies/Procedures    Echo 02/04/18 Study Conclusions - Left ventricle: The cavity size was mildly dilated. Wall   thickness was normal. There appears to be an LV apical thrombus.   Systolic function was severely reduced. The estimated ejection   fraction was in the range of 20% to 25%. Diffuse hypokinesis with   akinesis of the anteroseptal wall and apex. Features are   consistent with a pseudonormal left ventricular filling pattern,   with concomitant abnormal relaxation and increased filling   pressure (grade 2 diastolic dysfunction). - Aortic valve: There was no stenosis. - Mitral valve: There was mild regurgitation. - Left atrium: The atrium was moderately dilated. - Right ventricle:  The cavity size was normal. Systolic function   was normal. - Tricuspid valve: Peak RV-RA gradient (S): 41 mm Hg. - Pulmonary arteries: PA peak pressure: 44 mm Hg (S). - Inferior vena cava: The vessel was normal in size. The   respirophasic diameter changes were in the normal range (>= 50%),   consistent with normal central venous pressure.  Impressions: - Mildly dilated LV with EF 20-25%, diffuse hypokinesis with   anteroseptal and apical akinesis. There was an LV apical   thrombus. Moderate diastolic dysfunction. Normal RV size and   systolic function. Mild MR. Mild pulmonary hypertension.   Lexiscan: 02/13/18   There was no ST segment deviation noted during stress.  Defect 1: There is a large defect of severe severity present in the basal anteroseptal, mid  anteroseptal, mid inferoseptal, mid inferior, apical anterior, apical septal, apical inferior, apical lateral and apex location.  This is a high risk study.  Findings consistent with prior myocardial infarction. No ischemia.  Nuclear stress EF: 19%. Severe generalized hypokinesis.  The left ventricular ejection fraction is severely decreased (<30%).   LHC 02/14/18 1. Severe single-vessel coronary artery disease, including 99% chronic subtotal occlusion of the ostial LAD with TIMI-1 flow and left-to-left and right-to-left collaterals. Small D1, ramus, and RV marginal branches have 80-90%% ostial stenoses. 2. Moderately elevated left and right heart filling pressures. 3. Moderate pulmonary hypertension. 4. Low Fick cardiac output/index.  RHC  RA (mean): 11 mmHg RV (S/EDP): 68/10 mmHg PA (S/D, mean): 65/28 (40) mmHg PCWP (mean): 30 mmHg with prominent v-waves Ao sat: 95% PA sat: 50% Fick CO: 2.9 L/min Fick CI: 1.7 L/min/m^2   Disposition   Pt is being discharged home today in good condition.  Follow-up Plans & Appointments    Follow-up Information    [facility] Follow up in 2 day(s).   Specialty:  Cardiology Why:  INR check on Tues or Wed Contact information: [address] [place] [address] [phone]       Runell Gess, MD Follow up in 1 week(s).   Specialties:  Cardiology, Radiology Why:  TCM Contact information: [address] [place] [address] [phone]          Discharge Instructions    Diet - low sodium heart healthy   Complete by:  As directed    Discharge instructions   Complete by:  As directed    No driving for 1 week. No lifting over 5 lbs for 1 week. No sexual activity for 1 week.  Keep procedure site clean & dry. If you notice increased pain, swelling, bleeding or pus, call/return!  You may shower, but no soaking baths/hot tubs/pools for 1 week.   Increase activity  slowly   Complete by:  As directed       Discharge Medications   Allergies as of 02/17/2018      Reactions   Methocarbamol Nausea And Vomiting, Anxiety      Medication List    STOP taking these medications   azithromycin 250 MG tablet Commonly known as:  ZITHROMAX   predniSONE 50 MG tablet Commonly known as:  DELTASONE     TAKE these medications   ABILIFY 15 MG tablet Generic drug:  ARIPiprazole Take 7.5 mg by mouth daily.   acetaminophen 500 MG tablet Commonly known as:  TYLENOL Take 1,000 mg by mouth every 6 (six) hours as needed for mild pain.   aspirin 81 MG EC tablet Take  1 tablet (81 mg total) by mouth daily. Start taking on:  02/18/2018   atorvastatin 80 MG tablet Commonly known as:  LIPITOR Take 1 tablet (80 mg total) by mouth daily at 6 PM.   carvedilol 3.125 MG tablet Commonly known as:  COREG Take 1 tablet (3.125 mg total) by mouth 2 (two) times daily with a meal.   cyclobenzaprine 10 MG tablet Commonly known as:  FLEXERIL Take 1 tablet (10 mg total) by mouth 2 (two) times daily as needed for muscle spasms.   digoxin 0.125 MG tablet Commonly known as:  LANOXIN Take 1 tablet (0.125 mg total) by mouth daily. Start taking on:  02/18/2018   enoxaparin 60 MG/0.6ML injection Commonly known as:  LOVENOX Inject 0.6 mLs (60 mg total) into the skin every 12 (twelve) hours.   fluticasone 50 MCG/ACT nasal spray Commonly known as:  FLONASE Place 2 sprays into both nostrils daily.   furosemide 40 MG tablet Commonly known as:  LASIX Take 1 tablet (40 mg total) by mouth daily. Start taking on:  02/18/2018   HYDROcodone-acetaminophen 5-325 MG tablet Commonly known as:  NORCO/VICODIN Take 1 tablet by mouth every 4 (four) hours as needed.   ibuprofen 800 MG tablet Commonly known as:  ADVIL,MOTRIN Take 1 tablet (800 mg total) by mouth every 8 (eight) hours as needed.   loratadine 10 MG tablet Commonly known as:  CLARITIN Take 10 mg by mouth daily as  needed for allergies.   losartan 25 MG tablet Commonly known as:  COZAAR Take 1 tablet (25 mg total) by mouth daily. Start taking on:  02/18/2018   meclizine 25 MG tablet Commonly known as:  ANTIVERT Take 1 tablet (25 mg total) by mouth 3 (three) times daily as needed for dizziness.   naproxen 500 MG tablet Commonly known as:  NAPROSYN Take 1 tablet (500 mg total) by mouth 2 (two) times daily.   nitroGLYCERIN 0.4 MG SL tablet Commonly known as:  NITROSTAT Place 1 tablet (0.4 mg total) under the tongue every 5 (five) minutes x 3 doses as needed for chest pain.   ondansetron 4 MG tablet Commonly known as:  ZOFRAN Take 1 tablet (4 mg total) by mouth every 6 (six) hours.   spironolactone 25 MG tablet Commonly known as:  ALDACTONE Take 0.5 tablets (12.5 mg total) by mouth at bedtime.   traMADol 50 MG tablet Commonly known as:  ULTRAM Take 1-2 tablets (50-100 mg total) by mouth every 6 (six) hours as needed.   VITAMIN B-12 PO Take 1 tablet by mouth daily.   VITAMIN D PO Take 1 tablet by mouth daily.   warfarin 5 MG tablet Commonly known as:  COUMADIN Take 1 tablet (5 mg total) by mouth one time only at 6 PM.         Outstanding Labs/Studies   BMP at TCM appt INR Tues or Wed Echo in 1 month  Duration of Discharge Encounter   Greater than 30 minutes including physician time.  Signed, Roe Rutherfordngela Nicole Duke PA-C 02/17/2018, 11:37 AM

## 2018-02-17 NOTE — Progress Notes (Signed)
Lovenox covered by Medicaid, it will be $3. Otherwise ok for d/c>>>will d/c home.  Theodore DemarkRhonda Barrett, PA-C 02/17/2018 4:16 PM Beeper [phone]

## 2018-02-18 ENCOUNTER — Encounter: Payer: Self-pay | Admitting: Cardiovascular Disease

## 2018-02-18 ENCOUNTER — Telehealth: Payer: Self-pay | Admitting: Pharmacist

## 2018-02-18 NOTE — Telephone Encounter (Signed)
Talked to Mr Maisie Fushomas.  Initial appointment for coumadin clinic given for Wednesday 02/20/2018 at 3pm   Calton Harshfield Rodriguez-Guzman PharmD, BCPS, CPP [facility] [address] [place] [phone] 02/18/2018 10:24 AM

## 2018-02-20 ENCOUNTER — Ambulatory Visit (INDEPENDENT_AMBULATORY_CARE_PROVIDER_SITE_OTHER): Payer: Medicaid Other | Admitting: Pharmacist

## 2018-02-20 DIAGNOSIS — Z7901 Long term (current) use of anticoagulants: Secondary | ICD-10-CM | POA: Diagnosis not present

## 2018-02-20 DIAGNOSIS — I513 Intracardiac thrombosis, not elsewhere classified: Secondary | ICD-10-CM

## 2018-02-20 DIAGNOSIS — I240 Acute coronary thrombosis not resulting in myocardial infarction: Secondary | ICD-10-CM

## 2018-02-20 LAB — POCT INR: INR: 1.6

## 2018-02-25 ENCOUNTER — Ambulatory Visit (INDEPENDENT_AMBULATORY_CARE_PROVIDER_SITE_OTHER): Payer: Medicaid Other | Admitting: Adult Health

## 2018-02-25 ENCOUNTER — Encounter: Payer: Self-pay | Admitting: Adult Health

## 2018-02-25 VITALS — BP 115/62 | HR 72 | Ht 69.5 in | Wt 138.4 lb

## 2018-02-25 DIAGNOSIS — I519 Heart disease, unspecified: Secondary | ICD-10-CM

## 2018-02-25 DIAGNOSIS — R931 Abnormal findings on diagnostic imaging of heart and coronary circulation: Secondary | ICD-10-CM | POA: Diagnosis not present

## 2018-02-25 DIAGNOSIS — I2510 Atherosclerotic heart disease of native coronary artery without angina pectoris: Secondary | ICD-10-CM | POA: Diagnosis not present

## 2018-02-25 DIAGNOSIS — Z720 Tobacco use: Secondary | ICD-10-CM

## 2018-02-25 DIAGNOSIS — Z79899 Other long term (current) drug therapy: Secondary | ICD-10-CM | POA: Diagnosis not present

## 2018-02-25 NOTE — Patient Instructions (Signed)
Medication Instructions:  NO CHANGES- Your physician recommends that you continue on your current medications as directed. Please refer to the Current Medication list given to you today.  If you need a refill on your cardiac medications before your next appointment, please call your pharmacy.  Labwork: BMET AND DIG WHEN YOU HAVE ECHO DONE HERE IN OUR OFFICE AT [facility]  Take the provided lab slips for you to take with you to the lab for you blood draw.   You will NOT need to fast   Testing/Procedures: Echocardiogram(SCHEDULE IN 1 MONTH) - Your physician has requested that you have an echocardiogram. Echocardiography is a painless test that uses sound waves to create images of your heart. It provides your doctor with information about the size and shape of your heart and how well your heart's chambers and valves are working. This procedure takes approximately one hour. There are no restrictions for this procedure. This will be performed at our [facility] location - [address], [address].  OUR CARE GUIDE WILL BE CALLING YOU TO DISCUSS SMOKING CESSATION   Follow-Up: Your physician wants you to follow-up in: 3 MONTHS WITH DR Allyson SabalBERRY.  [facility] you [facility] choosing [facility]!!

## 2018-02-25 NOTE — Progress Notes (Signed)
Cardiology Office Note   Date:  02/25/2018   ID:  Ricardo Brooks Caponi, DOB 28-Feb-1956, MRN [id number]  PCP:  Gilda CreasePavelock, Richard M, MD  [facility]  [facility]  Chief Complaint  Patient presents with  . Follow-up  . Hospitalization Follow-up  . Congestive Heart Failure  . Coronary Artery Disease     History of Present Illness: Ricardo Brooks Nobile is a 62 y.o. male who presents for post hospitalization follow up after admission for shortness of breath and diagnosed with acute on chronic CHF with echocardiogram revealing EF of 20% to 25% with diffuse hypokinesis, anterior septal wall and apex.  Patient was also diagnosed with grade 2 diastolic dysfunction, elevated PA pressure 44 m of mercury.  The patient had a left heart cath on 02/14/2018 with severe single-vessel CAD.  The patient had 99% chronic subtotal occlusion of the ostial LAD with TIMI-1 flow and left to left and right-to-left collaterals.  Given collaterals have low suspicion for acute event, PCI was not indicated.  It was not felt that the LAD territory did not have significant viability.  Patient was fitted for LifeVest before discharge to have a repeat echocardiogram in 1 month improvement.  Patient also was found to have an LV thrombus on echocardiogram and placed on Lovenox bridge to therapeutic Coumadin.  He is plan for long-term Coumadin therapy with an INR goal of 2.0-3.0.  To aspirin until INR was therapeutic followed by [facility]. .\  He is here today without any cardiac complaints.  He states he is back to his normal activities.  He does have some muscle soreness not enough to keep him from walking he states that he needs to have his right hip replaced but this was postponed due to his heart function.  Medically compliant denies any bleeding, palpitations, or dizziness.  He continues to wear the LifeVest.  Past Medical History:  Diagnosis Date  . Asthma   . Bipolar 1 disorder (HCC)   . Chronic back pain   . Hip pain, chronic    . Hypertension   . PTSD (post-traumatic stress disorder)     Past Surgical History:  Procedure Laterality Date  . orif right ankle Right 1985  . RIGHT/LEFT HEART CATH AND CORONARY ANGIOGRAPHY N/A 02/14/2018   Procedure: RIGHT/LEFT HEART CATH AND CORONARY ANGIOGRAPHY;  Surgeon: Yvonne KendallEnd, Christopher, MD;  [facility];  Service: Cardiovascular;  Laterality: N/A;     Current Outpatient Medications  Medication Sig Dispense Refill  . acetaminophen (TYLENOL) 500 MG tablet Take 1,000 mg by mouth every 6 (six) hours as needed for mild pain.     . ARIPiprazole (ABILIFY) 15 MG tablet Take 7.5 mg by mouth daily.    Marland Kitchen. aspirin EC 81 MG EC tablet Take 1 tablet (81 mg total) by mouth daily. 30 tablet 3  . atorvastatin (LIPITOR) 80 MG tablet Take 1 tablet (80 mg total) by mouth daily at 6 PM. 90 tablet 3  . carvedilol (COREG) 3.125 MG tablet Take 1 tablet (3.125 mg total) by mouth 2 (two) times daily with a meal. 60 tablet 6  . Cholecalciferol (VITAMIN D PO) Take 1 tablet by mouth daily.    . Cyanocobalamin (VITAMIN B-12 PO) Take 1 tablet by mouth daily.    . cyclobenzaprine (FLEXERIL) 10 MG tablet Take 1 tablet (10 mg total) by mouth 2 (two) times daily as needed for muscle spasms. 20 tablet 0  . digoxin (LANOXIN) 0.125 MG tablet Take 1 tablet (0.125 mg total) by mouth  daily. 90 tablet 3  . enoxaparin (LOVENOX) 60 MG/0.6ML injection Inject 0.6 mLs (60 mg total) into the skin every 12 (twelve) hours. 14 Syringe 1  . fluticasone (FLONASE) 50 MCG/ACT nasal spray Place 2 sprays into both nostrils daily. 16 g 2  . furosemide (LASIX) 40 MG tablet Take 1 tablet (40 mg total) by mouth daily. 90 tablet 3  . HYDROcodone-acetaminophen (NORCO/VICODIN) 5-325 MG tablet Take 1 tablet by mouth every 4 (four) hours as needed. 10 tablet 0  . ibuprofen (ADVIL,MOTRIN) 800 MG tablet Take 1 tablet (800 mg total) by mouth every 8 (eight) hours as needed. 21 tablet 0  . loratadine (CLARITIN) 10 MG tablet Take 10  mg by mouth daily as needed for allergies.     Marland Kitchen losartan (COZAAR) 25 MG tablet Take 1 tablet (25 mg total) by mouth daily. 90 tablet 3  . meclizine (ANTIVERT) 25 MG tablet Take 1 tablet (25 mg total) by mouth 3 (three) times daily as needed for dizziness. 30 tablet 0  . naproxen (NAPROSYN) 500 MG tablet Take 1 tablet (500 mg total) by mouth 2 (two) times daily. 20 tablet 0  . nitroGLYCERIN (NITROSTAT) 0.4 MG SL tablet Place 1 tablet (0.4 mg total) under the tongue every 5 (five) minutes x 3 doses as needed for chest pain. 25 tablet 1  . ondansetron (ZOFRAN) 4 MG tablet Take 1 tablet (4 mg total) by mouth every 6 (six) hours. 12 tablet 0  . spironolactone (ALDACTONE) 25 MG tablet Take 0.5 tablets (12.5 mg total) by mouth at bedtime. 90 tablet 3  . traMADol (ULTRAM) 50 MG tablet Take 1-2 tablets (50-100 mg total) by mouth every 6 (six) hours as needed. 60 tablet 0  . warfarin (COUMADIN) 5 MG tablet Take 1 tablet (5 mg total) by mouth one time only at 6 PM. 30 tablet 1   No current facility-administered medications for this visit.     Allergies:   Methocarbamol    Social History:  The patient  reports that he has been smoking cigarettes.  He has been smoking about 0.50 packs per day. He has never used smokeless tobacco. He reports that he drinks alcohol. He reports that he does not use drugs.   Family History:  The patient's family history is not on file.    ROS: All other systems are reviewed and negative. Unless otherwise mentioned in H&P    PHYSICAL EXAM: VS:  BP 115/62   Pulse 72   Ht 5' 9.5" (1.765 m)   Wt 138 lb 6.4 oz (62.8 kg)   BMI 20.15 kg/m  , BMI Body mass index is 20.15 kg/m. GEN: Well nourished, well developed, in no acute distress  HEENT: normal wearing LifeVest  neck: no JVD, carotid bruits, or masses Cardiac: RRR; no murmurs, rubs, or gallops,no edema  Respiratory:  clear to auscultation bilaterally, normal work of breathing GI: soft, nontender, nondistended, +  BS MS: no deformity or atrophy  Skin: warm and dry, no rash Neuro:  Strength and sensation are intact Psych: euthymic mood, full affect   EKG: Not done today.  Recent Labs: 02/12/2018: B Natriuretic Peptide 633.4; TSH 1.651 02/17/2018: BUN 14; Creatinine, Ser 0.94; Hemoglobin 13.3; Platelets 188; Potassium 4.6; Sodium 137    Lipid Panel    Component Value Date/Time   CHOL 179 02/13/2018 0253   TRIG 120 02/13/2018 0253   HDL 37 (L) 02/13/2018 0253   CHOLHDL 4.8 02/13/2018 0253   VLDL 24 02/13/2018 0253  LDLCALC 118 (H) 02/13/2018 0253      Wt Readings from Last 3 Encounters:  02/25/18 138 lb 6.4 oz (62.8 kg)  02/17/18 136 lb 11 oz (62 kg)  09/25/17 140 lb (63.5 kg)      Other studies Reviewed: Echo 02/14/2018 Left ventricle: The cavity size was mildly dilated. Wall   thickness was normal. There appears to be an LV apical thrombus.   Systolic function was severely reduced. The estimated ejection   fraction was in the range of 20% to 25%. Diffuse hypokinesis with   akinesis of the anteroseptal wall and apex. Features are   consistent with a pseudonormal left ventricular filling pattern,   with concomitant abnormal relaxation and increased filling   pressure (grade 2 diastolic dysfunction). - Aortic valve: There was no stenosis. - Mitral valve: There was mild regurgitation. - Left atrium: The atrium was moderately dilated. - Right ventricle: The cavity size was normal. Systolic function   was normal. - Tricuspid valve: Peak RV-RA gradient (S): 41 mm Hg. - Pulmonary arteries: PA peak pressure: 44 mm Hg (S). - Inferior vena cava: The vessel was normal in size. The   respirophasic diameter changes were in the normal range (>= 50%),   consistent with normal central venous pressure.  Impressions:  - Mildly dilated LV with EF 20-25%, diffuse hypokinesis with   anteroseptal and apical akinesis. There was an LV apical   thrombus. Moderate diastolic dysfunction. Normal RV  size and   systolic function. Mild MR. Mild pulmonary hypertension.  Cardiac Cath 02/14/2017 Conclusion   Conclusions: 1. Severe single-vessel coronary artery disease, including 99% chronic subtotal occlusion of the ostial LAD with TIMI-1 flow and left-to-left and right-to-left collaterals. Small D1, ramus, and RV marginal branches have 80-90%% ostial stenoses. 2. Moderately elevated left and right heart filling pressures. 3. Moderate pulmonary hypertension. 4. Low Fick cardiac output/index.  Recommendations: 1. Medical optimization, including diuresis and institution of evidence-based heart failure therapy. Metoprolol switched to carvedilol. Consider adding ACEI/ARB tomorrow as blood pressure and renal function allow. 2. Consider viability study once patient has been medically optimized. If there is viable myocardium in the LAD territory, PCI to the ostial LAD should be considered. 3. Aggressive secondary prevention, including high-intensity statin therapy. 4. Continue telemetry monitoring; if there is further evidence of atrial flutter, long-term anticoagulation will need to be considered.  Yvonne Kendall, MD     ASSESSMENT AND PLAN:  1.  Ischemic cardiomyopathy: The patient's most recent echocardiogram revealed an EF of 20% to 25% with Diffuse hypokinesis with akinesis of the anteroseptal wall and apex.  Medical therapy to 3.125 mg digoxin 0.125 mg daily, furosemide 40 mg daily, losartan 25 mg daily and spironolactone.  Blood pressure is soft, normal for reduced EF.  No evidence of fluid retention.  2.  Coronary artery disease: Patient had severe single-vessel CAD per cardiac catheterization no planned intervention or viable targets.  Continue secondary prevention with statin therapy.  3. HFrEF:, Now wearing LifeVest.  Follow-up echo in 1 month for evaluation of response to medication, if LV function is not improved, the patient will be considered for ICD implant.  Follow-up labs to  include a BMET and Dig level, along with his echo will be ordered.  Consider stopping naproxen to avoid hypertension and reduced kidney function.    4.Tobacco abuse: Deferred to health coach with smoking cessation discussed at this visit.  Current medicines are reviewed at length with the patient today.    Labs/ tests ordered  today include:  BMET, Bobby Rumpf. Liborio Nixon, ANP, AACC   02/25/2018 9:49 AM    [facility] [address] [place], [place] [address] Phone: [phone]; Fax: [phone]

## 2018-02-26 ENCOUNTER — Ambulatory Visit (INDEPENDENT_AMBULATORY_CARE_PROVIDER_SITE_OTHER): Payer: Medicaid Other | Admitting: Orthopaedic Surgery

## 2018-02-26 ENCOUNTER — Telehealth (INDEPENDENT_AMBULATORY_CARE_PROVIDER_SITE_OTHER): Payer: Self-pay | Admitting: Orthopaedic Surgery

## 2018-02-26 ENCOUNTER — Encounter (INDEPENDENT_AMBULATORY_CARE_PROVIDER_SITE_OTHER): Payer: Self-pay | Admitting: Orthopaedic Surgery

## 2018-02-26 DIAGNOSIS — M1611 Unilateral primary osteoarthritis, right hip: Secondary | ICD-10-CM

## 2018-02-26 LAB — BASIC METABOLIC PANEL
BUN/Creatinine Ratio: 16 (ref 9–20)
BUN: 16 mg/dL (ref 6–24)
CALCIUM: 9.1 mg/dL (ref 8.7–10.2)
CHLORIDE: 101 mmol/L (ref 96–106)
CO2: 26 mmol/L (ref 20–29)
Creatinine, Ser: 0.97 mg/dL (ref 0.76–1.27)
GFR calc non Af Amer: 85 mL/min/{1.73_m2} (ref >59)
GFR, EST AFRICAN AMERICAN: 98 mL/min/{1.73_m2} (ref >59)
Glucose: 98 mg/dL (ref 65–99)
POTASSIUM: 4.8 mmol/L (ref 3.5–5.2)
Sodium: 140 mmol/L (ref 134–144)

## 2018-02-26 LAB — DIGOXIN LEVEL: DIGOXIN, SERUM: 0.4 ng/mL — AB (ref 0.5–0.9)

## 2018-02-26 MED ORDER — TRAMADOL HCL 50 MG PO TABS: 50.0000 mg | ORAL_TABLET | Freq: Three times a day (TID) | ORAL | 2 refills | Status: DC | PRN

## 2018-02-26 NOTE — Progress Notes (Signed)
Office Visit Note   Patient: Ricardo Brooks           Date of Birth: Apr 19, 1956           MRN: [id number] Visit Date: 02/26/2018              Requested by: Gilda Crease, MD [address] [place], [place] [address] PCP: Gilda Crease, MD   Assessment & Plan: Visit Diagnoses:  1. Primary osteoarthritis of right hip     Plan: Impression is 62 year old gentleman with right greater than left hip degenerative joint disease.  Tramadol was refilled today.  His Coumadin is currently being titrated to a therapeutic level.  Patient instructed to follow-up with [place] once Dr. Allyson Sabal  gives him the okay to proceed with a total hip replacement.  Follow-Up Instructions: Return if symptoms worsen or fail to improve.   Orders:  No orders of the defined types were placed in this encounter.  Meds ordered this encounter  Medications  . traMADol (ULTRAM) 50 MG tablet    Sig: Take 1-2 tablets (50-100 mg total) by mouth 3 (three) times daily as needed.    Dispense:  60 tablet    Refill:  2      Procedures: No procedures performed   Clinical Data: No additional findings.   Subjective: Chief Complaint  Patient presents with  . Right Hip - Pain    Patient follows up to the hip degenerative joint disease worse on the right.  He recently was hospitalized for heart catheterization for acute CHF.  He is currently on Coumadin.  He is followed by Dr. Allyson Sabal.  He is requesting a tramadol refill today.  He would like to have a right total hip replacement as soon as he is medically cleared to do so.   Review of Systems  Constitutional: Negative.   All other systems reviewed and are negative.    Objective: Vital Signs: There were no vitals taken for this visit.  Physical Exam  Constitutional: He is oriented to person, place, and time. He appears well-developed and well-nourished.  Pulmonary/Chest: Effort normal.  Abdominal: Soft.  Neurological: He is alert and oriented to  person, place, and time.  Skin: Skin is warm.  Psychiatric: He has a normal mood and affect. His behavior is normal. Judgment and thought content normal.  Nursing note and vitals reviewed.   Ortho Exam Right hip exam is stable. Specialty Comments:  No specialty comments available.  Imaging: No results found.   PMFS History: Patient Active Problem List   Diagnosis Date Noted  . CAD (coronary artery disease) 02/17/2018  . LV (left ventricular) mural thrombus without MI 02/17/2018  . Hypertension   . Chronic back pain   . Bipolar 1 disorder (HCC)   . Elevated troponin   . Acute systolic heart failure (HCC)   . SOB (shortness of breath) 02/12/2018  . Primary osteoarthritis of right hip 06/26/2017  . Primary osteoarthritis of left hip 06/26/2017   Past Medical History:  Diagnosis Date  . Asthma   . Bipolar 1 disorder (HCC)   . Chronic back pain   . Hip pain, chronic   . Hypertension   . PTSD (post-traumatic stress disorder)     History reviewed. No pertinent family history.  Past Surgical History:  Procedure Laterality Date  . orif right ankle Right 1985  . RIGHT/LEFT HEART CATH AND CORONARY ANGIOGRAPHY N/A 02/14/2018   Procedure: RIGHT/LEFT HEART CATH AND CORONARY ANGIOGRAPHY;  Surgeon: Yvonne KendallEnd, Christopher, MD;  Location: [facility];  Service: Cardiovascular;  Laterality: N/A;   Social History   Occupational History  . Not on file  Tobacco Use  . Smoking status: Current Every Day Smoker    Packs/day: 0.50    Types: Cigarettes  . Smokeless tobacco: Never Used  Substance and Sexual Activity  . Alcohol use: Yes  . Drug use: No  . Sexual activity: Not on file

## 2018-02-26 NOTE — Telephone Encounter (Signed)
Med refill  Tramadol  

## 2018-02-26 NOTE — Telephone Encounter (Signed)
No, he needs appointment with Roda ShuttersXu first

## 2018-02-28 ENCOUNTER — Telehealth: Payer: Self-pay | Admitting: Adult Health

## 2018-02-28 NOTE — Telephone Encounter (Signed)
Reached out to schedule apt. Pt says he is interested in coming in. Will call back to schedule apt once he knows his schedule. Expecting callback

## 2018-03-15 ENCOUNTER — Telehealth: Payer: Self-pay | Admitting: Adult Health

## 2018-03-15 NOTE — Telephone Encounter (Signed)
Reached out to schedule initial visit. Number is unavailable.

## 2018-03-25 ENCOUNTER — Telehealth (INDEPENDENT_AMBULATORY_CARE_PROVIDER_SITE_OTHER): Payer: Self-pay | Admitting: Orthopaedic Surgery

## 2018-03-25 ENCOUNTER — Other Ambulatory Visit (INDEPENDENT_AMBULATORY_CARE_PROVIDER_SITE_OTHER): Payer: Self-pay

## 2018-03-25 ENCOUNTER — Telehealth (INDEPENDENT_AMBULATORY_CARE_PROVIDER_SITE_OTHER): Payer: Self-pay

## 2018-03-25 MED ORDER — TRAMADOL HCL 50 MG PO TABS: 50.0000 mg | ORAL_TABLET | Freq: Three times a day (TID) | ORAL | 2 refills | Status: DC | PRN

## 2018-03-25 NOTE — Telephone Encounter (Signed)
Called and advised patient to take only when needed. Rx called into pharm

## 2018-03-25 NOTE — Telephone Encounter (Signed)
FYI:   Pharmacist from [facility] wanted to let us know patient takes  Tramadol Religiously as prescribed not PRN and is concerned. Wanted for me to let Dr know. I will call patient to let him know just to take it PRN only when needed.    Rx called into pharm.

## 2018-03-25 NOTE — Telephone Encounter (Signed)
Please advise 

## 2018-03-25 NOTE — Telephone Encounter (Signed)
Ok to call in with same previous instructions

## 2018-03-25 NOTE — Telephone Encounter (Signed)
Patient called asking for a refill on his Tramadol. He [facility] the [facility] [address] CB # [phone]

## 2018-03-25 NOTE — Telephone Encounter (Signed)
Ok, thanks.

## 2018-03-28 ENCOUNTER — Other Ambulatory Visit: Payer: Self-pay

## 2018-03-28 ENCOUNTER — Ambulatory Visit (HOSPITAL_COMMUNITY): Payer: Medicaid Other | Attending: Cardiology

## 2018-03-28 DIAGNOSIS — I340 Nonrheumatic mitral (valve) insufficiency: Secondary | ICD-10-CM | POA: Diagnosis not present

## 2018-03-28 DIAGNOSIS — I519 Heart disease, unspecified: Secondary | ICD-10-CM | POA: Diagnosis not present

## 2018-03-28 DIAGNOSIS — J45909 Unspecified asthma, uncomplicated: Secondary | ICD-10-CM | POA: Diagnosis not present

## 2018-03-28 DIAGNOSIS — R29898 Other symptoms and signs involving the musculoskeletal system: Secondary | ICD-10-CM | POA: Diagnosis not present

## 2018-03-28 DIAGNOSIS — I119 Hypertensive heart disease without heart failure: Secondary | ICD-10-CM | POA: Insufficient documentation

## 2018-03-28 DIAGNOSIS — R931 Abnormal findings on diagnostic imaging of heart and coronary circulation: Secondary | ICD-10-CM | POA: Diagnosis not present

## 2018-03-28 MED ORDER — PERFLUTREN LIPID MICROSPHERE
1.0000 mL | INTRAVENOUS | Status: AC | PRN
  Administered 2018-03-28: 2 mL via INTRAVENOUS

## 2018-04-03 ENCOUNTER — Other Ambulatory Visit (INDEPENDENT_AMBULATORY_CARE_PROVIDER_SITE_OTHER): Payer: Self-pay | Admitting: Orthopaedic Surgery

## 2018-04-15 ENCOUNTER — Telehealth (INDEPENDENT_AMBULATORY_CARE_PROVIDER_SITE_OTHER): Payer: Self-pay | Admitting: Orthopaedic Surgery

## 2018-04-15 ENCOUNTER — Other Ambulatory Visit (INDEPENDENT_AMBULATORY_CARE_PROVIDER_SITE_OTHER): Payer: Self-pay

## 2018-04-15 MED ORDER — TRAMADOL HCL 50 MG PO TABS: ORAL_TABLET | ORAL | 0 refills | Status: DC

## 2018-04-15 NOTE — Telephone Encounter (Signed)
#  30.  Ask him if he has seen his cardiologist yet.  Thanks.

## 2018-04-15 NOTE — Telephone Encounter (Signed)
Please advise 

## 2018-04-15 NOTE — Telephone Encounter (Signed)
Patient called asking for a refill on his tramadol. CB # [phone]

## 2018-04-15 NOTE — Telephone Encounter (Signed)
Patient states he is seeing Cardiologist next month.

## 2018-04-15 NOTE — Telephone Encounter (Signed)
Called Rx into pharm. See message below.

## 2018-04-15 NOTE — Telephone Encounter (Signed)
Patient called advised he uses the [facility] only. The number to contact patient is [phone]

## 2018-04-24 ENCOUNTER — Telehealth (INDEPENDENT_AMBULATORY_CARE_PROVIDER_SITE_OTHER): Payer: Self-pay | Admitting: Orthopaedic Surgery

## 2018-04-24 NOTE — Telephone Encounter (Signed)
Patient left vm requesting rx refill on tramadol from Dr. Roda Shutters. Patients # [phone]

## 2018-04-24 NOTE — Telephone Encounter (Signed)
Please advise 

## 2018-04-24 NOTE — Telephone Encounter (Signed)
20

## 2018-04-25 ENCOUNTER — Other Ambulatory Visit (INDEPENDENT_AMBULATORY_CARE_PROVIDER_SITE_OTHER): Payer: Self-pay

## 2018-04-25 MED ORDER — TRAMADOL HCL 50 MG PO TABS: ORAL_TABLET | ORAL | 0 refills | Status: DC

## 2018-04-25 NOTE — Telephone Encounter (Signed)
Called into pharm . This will be the last RX PER DR XU. Called patient to let him know,he is aware.

## 2018-05-02 ENCOUNTER — Telehealth: Payer: Self-pay

## 2018-05-02 NOTE — Telephone Encounter (Signed)
Tried to call to schedule initial care guide visit. Left message

## 2018-05-07 NOTE — Telephone Encounter (Signed)
Pt appt  1:15pm  [facility]   Pt came in requesting med wanted to know if he could have a med refill since [facility]

## 2018-05-07 NOTE — Telephone Encounter (Signed)
Please advise 

## 2018-05-10 ENCOUNTER — Institutional Professional Consult (permissible substitution) (INDEPENDENT_AMBULATORY_CARE_PROVIDER_SITE_OTHER): Payer: Self-pay | Admitting: Orthopaedic Surgery

## 2018-05-16 ENCOUNTER — Telehealth: Payer: Self-pay

## 2018-05-16 ENCOUNTER — Encounter (INDEPENDENT_AMBULATORY_CARE_PROVIDER_SITE_OTHER): Payer: Self-pay | Admitting: Orthopaedic Surgery

## 2018-05-16 ENCOUNTER — Ambulatory Visit (INDEPENDENT_AMBULATORY_CARE_PROVIDER_SITE_OTHER): Payer: Medicaid Other | Admitting: Orthopaedic Surgery

## 2018-05-16 ENCOUNTER — Ambulatory Visit (INDEPENDENT_AMBULATORY_CARE_PROVIDER_SITE_OTHER): Payer: Medicaid Other

## 2018-05-16 DIAGNOSIS — I2510 Atherosclerotic heart disease of native coronary artery without angina pectoris: Secondary | ICD-10-CM

## 2018-05-16 DIAGNOSIS — M1611 Unilateral primary osteoarthritis, right hip: Secondary | ICD-10-CM

## 2018-05-16 DIAGNOSIS — I5021 Acute systolic (congestive) heart failure: Secondary | ICD-10-CM | POA: Diagnosis not present

## 2018-05-16 MED ORDER — TRAMADOL HCL 50 MG PO TABS: 50.0000 mg | ORAL_TABLET | Freq: Three times a day (TID) | ORAL | 2 refills | Status: DC | PRN

## 2018-05-16 NOTE — Telephone Encounter (Signed)
Called to schedule initial care guide session. Left message

## 2018-05-16 NOTE — Progress Notes (Signed)
Office Visit Note   Patient: Ricardo CabalDavid Brooks           Date of Birth: 10-Oct-1956           MRN: [id number] Visit Date: 05/16/2018              Requested by: Gilda CreasePavelock, Richard M, MD [address] [place], [place] [id number] PCP: Gilda CreasePavelock, Richard M, MD   Assessment & Plan: Visit Diagnoses:  1. Primary osteoarthritis of right hip   2. Acute systolic heart failure (HCC)   3. Coronary artery disease involving native heart without angina pectoris, unspecified vessel or lesion type     Plan: Impression is 62 year old gentleman with end-stage degenerative joint disease of his right hip.  We will obtain cardiac clearance prior to scheduling surgery.  He is on Coumadin for which she has to come off of prior to surgery.  He understands the risks and benefits of a total hip replacement including infection, dislocation, leg length discrepancy, DVT.  We will have his cardiologist comment on his perioperative cardiac risk in his up coming appointment.  Questions encouraged and answered to his satisfaction.  Follow-up as needed.  Follow-Up Instructions: Return if symptoms worsen or fail to improve.   Orders:  Orders Placed This Encounter  Procedures  . XR HIP UNILAT W OR W/O PELVIS 2-3 VIEWS RIGHT   Meds ordered this encounter  Medications  . traMADol (ULTRAM) 50 MG tablet    Sig: Take 1 tablet (50 mg total) by mouth 3 (three) times daily as needed.    Dispense:  30 tablet    Refill:  2      Procedures: No procedures performed   Clinical Data: No additional findings.   Subjective: Chief Complaint  Patient presents with  . Right Hip - Pain     Mr. Ricardo Brooks is a 62 year old gentleman who comes in for mainly severe right hip pain.  He has been to our office in the past and we have discussed the total hip replacement.  In the meantime he has had a heart catheterization which revealed congestive heart failure and decreased ejection fraction with coronary artery disease.  He does  have an appointment with his cardiologist on June 29 for follow-up.  He is currently taking Coumadin.  He has significant pain in his right hip.  He has difficulty ambulating and difficulty with ADLs and chronic night pain.   Review of Systems  Constitutional: Negative.   All other systems reviewed and are negative.    Objective: Vital Signs: There were no vitals taken for this visit.  Physical Exam  Constitutional: He is oriented to person, place, and time. He appears well-developed and well-nourished.  Pulmonary/Chest: Effort normal.  Abdominal: Soft.  Neurological: He is alert and oriented to person, place, and time.  Skin: Skin is warm.  Psychiatric: He has a normal mood and affect. His behavior is normal. Judgment and thought content normal.  Nursing note and vitals reviewed.   Ortho Exam Right hip exam shows a positive FADIR.  Positive Stinchfield.  Leg lengths are equal. Specialty Comments:  No specialty comments available.  Imaging: Xr Hip Unilat W Or W/o Pelvis 2-3 Views Right  Result Date: 05/16/2018 End-stage degenerative joint disease of both hips    PMFS History: Patient Active Problem List   Diagnosis Date Noted  . CAD (coronary artery disease) 02/17/2018  . LV (left ventricular) mural thrombus without MI 02/17/2018  . Hypertension   . Chronic back  pain   . Bipolar 1 disorder (HCC)   . Elevated troponin   . Acute systolic heart failure (HCC)   . SOB (shortness of breath) 02/12/2018  . Primary osteoarthritis of right hip 06/26/2017  . Primary osteoarthritis of left hip 06/26/2017   Past Medical History:  Diagnosis Date  . Asthma   . Bipolar 1 disorder (HCC)   . Chronic back pain   . Hip pain, chronic   . Hypertension   . PTSD (post-traumatic stress disorder)     History reviewed. No pertinent family history.  Past Surgical History:  Procedure Laterality Date  . orif right ankle Right 1985  . RIGHT/LEFT HEART CATH AND CORONARY ANGIOGRAPHY N/A  02/14/2018   Procedure: RIGHT/LEFT HEART CATH AND CORONARY ANGIOGRAPHY;  Surgeon: Yvonne Kendall, MD;  Location: [facility];  Service: Cardiovascular;  Laterality: N/A;   Social History   Occupational History  . Not on file  Tobacco Use  . Smoking status: Current Every Day Smoker    Packs/day: 0.50    Types: Cigarettes  . Smokeless tobacco: Never Used  Substance and Sexual Activity  . Alcohol use: Yes  . Drug use: No  . Sexual activity: Not on file

## 2018-05-16 NOTE — Telephone Encounter (Signed)
Pt returned phone called. Agreed to apt for 6/19 at 3:00 pm

## 2018-05-22 ENCOUNTER — Ambulatory Visit: Payer: Medicaid Other

## 2018-05-28 ENCOUNTER — Ambulatory Visit: Payer: Medicaid Other | Admitting: Cardiovascular Disease

## 2018-06-07 ENCOUNTER — Other Ambulatory Visit (INDEPENDENT_AMBULATORY_CARE_PROVIDER_SITE_OTHER): Payer: Self-pay | Admitting: Orthopaedic Surgery

## 2018-06-07 NOTE — Telephone Encounter (Signed)
Called in [facility] to [facility].

## 2018-06-07 NOTE — Telephone Encounter (Signed)
Can you advise on this, since Dr. Roda ShuttersXu is not in the office?

## 2018-06-12 ENCOUNTER — Ambulatory Visit: Payer: Medicaid Other | Admitting: Cardiovascular Disease

## 2018-06-13 ENCOUNTER — Encounter: Payer: Self-pay | Admitting: *Deleted

## 2018-06-24 ENCOUNTER — Emergency Department (HOSPITAL_COMMUNITY): Payer: Medicaid Other

## 2018-06-24 ENCOUNTER — Other Ambulatory Visit: Payer: Self-pay

## 2018-06-24 ENCOUNTER — Encounter (HOSPITAL_COMMUNITY): Payer: Self-pay | Admitting: *Deleted

## 2018-06-24 ENCOUNTER — Inpatient Hospital Stay (HOSPITAL_COMMUNITY)
Admission: EM | Admit: 2018-06-24 | Discharge: 2018-06-27 | DRG: 065 | Discharge status: Against Medical Advice | Payer: Medicaid Other | Attending: Internal Medicine | Admitting: Internal Medicine

## 2018-06-24 DIAGNOSIS — Z7901 Long term (current) use of anticoagulants: Secondary | ICD-10-CM

## 2018-06-24 DIAGNOSIS — M549 Dorsalgia, unspecified: Secondary | ICD-10-CM

## 2018-06-24 DIAGNOSIS — Z888 Allergy status to other drugs, medicaments and biological substances status: Secondary | ICD-10-CM

## 2018-06-24 DIAGNOSIS — Z91128 Patient's intentional underdosing of medication regimen for other reason: Secondary | ICD-10-CM

## 2018-06-24 DIAGNOSIS — I255 Ischemic cardiomyopathy: Secondary | ICD-10-CM | POA: Diagnosis present

## 2018-06-24 DIAGNOSIS — I444 Left anterior fascicular block: Secondary | ICD-10-CM | POA: Diagnosis present

## 2018-06-24 DIAGNOSIS — I6349 Cerebral infarction due to embolism of other cerebral artery: Principal | ICD-10-CM | POA: Diagnosis present

## 2018-06-24 DIAGNOSIS — Z7982 Long term (current) use of aspirin: Secondary | ICD-10-CM

## 2018-06-24 DIAGNOSIS — I10 Essential (primary) hypertension: Secondary | ICD-10-CM | POA: Diagnosis present

## 2018-06-24 DIAGNOSIS — R4701 Aphasia: Secondary | ICD-10-CM | POA: Diagnosis not present

## 2018-06-24 DIAGNOSIS — R479 Unspecified speech disturbances: Secondary | ICD-10-CM

## 2018-06-24 DIAGNOSIS — I639 Cerebral infarction, unspecified: Secondary | ICD-10-CM | POA: Diagnosis present

## 2018-06-24 DIAGNOSIS — Z791 Long term (current) use of non-steroidal anti-inflammatories (NSAID): Secondary | ICD-10-CM

## 2018-06-24 DIAGNOSIS — I110 Hypertensive heart disease with heart failure: Secondary | ICD-10-CM | POA: Diagnosis present

## 2018-06-24 DIAGNOSIS — G43909 Migraine, unspecified, not intractable, without status migrainosus: Secondary | ICD-10-CM | POA: Diagnosis present

## 2018-06-24 DIAGNOSIS — Z79899 Other long term (current) drug therapy: Secondary | ICD-10-CM

## 2018-06-24 DIAGNOSIS — I5022 Chronic systolic (congestive) heart failure: Secondary | ICD-10-CM | POA: Diagnosis present

## 2018-06-24 DIAGNOSIS — T45516A Underdosing of anticoagulants, initial encounter: Secondary | ICD-10-CM | POA: Diagnosis present

## 2018-06-24 DIAGNOSIS — R29701 NIHSS score 1: Secondary | ICD-10-CM | POA: Diagnosis present

## 2018-06-24 DIAGNOSIS — F4310 Post-traumatic stress disorder, unspecified: Secondary | ICD-10-CM | POA: Diagnosis present

## 2018-06-24 DIAGNOSIS — Z5321 Procedure and treatment not carried out due to patient leaving prior to being seen by health care provider: Secondary | ICD-10-CM | POA: Diagnosis present

## 2018-06-24 DIAGNOSIS — I2510 Atherosclerotic heart disease of native coronary artery without angina pectoris: Secondary | ICD-10-CM | POA: Diagnosis present

## 2018-06-24 DIAGNOSIS — I240 Acute coronary thrombosis not resulting in myocardial infarction: Secondary | ICD-10-CM | POA: Diagnosis present

## 2018-06-24 DIAGNOSIS — I513 Intracardiac thrombosis, not elsewhere classified: Secondary | ICD-10-CM | POA: Diagnosis present

## 2018-06-24 DIAGNOSIS — H02402 Unspecified ptosis of left eyelid: Secondary | ICD-10-CM | POA: Diagnosis present

## 2018-06-24 DIAGNOSIS — Z9089 Acquired absence of other organs: Secondary | ICD-10-CM

## 2018-06-24 DIAGNOSIS — Z79891 Long term (current) use of opiate analgesic: Secondary | ICD-10-CM

## 2018-06-24 DIAGNOSIS — G8929 Other chronic pain: Secondary | ICD-10-CM | POA: Diagnosis present

## 2018-06-24 DIAGNOSIS — E785 Hyperlipidemia, unspecified: Secondary | ICD-10-CM | POA: Diagnosis present

## 2018-06-24 DIAGNOSIS — F319 Bipolar disorder, unspecified: Secondary | ICD-10-CM | POA: Diagnosis present

## 2018-06-24 DIAGNOSIS — F17210 Nicotine dependence, cigarettes, uncomplicated: Secondary | ICD-10-CM | POA: Diagnosis present

## 2018-06-24 DIAGNOSIS — M170 Bilateral primary osteoarthritis of knee: Secondary | ICD-10-CM | POA: Diagnosis present

## 2018-06-24 DIAGNOSIS — J45909 Unspecified asthma, uncomplicated: Secondary | ICD-10-CM | POA: Diagnosis present

## 2018-06-24 DIAGNOSIS — M545 Low back pain: Secondary | ICD-10-CM | POA: Diagnosis present

## 2018-06-24 HISTORY — DX: Other chronic pain: G89.29

## 2018-06-24 HISTORY — DX: Heart failure, unspecified: I50.9

## 2018-06-24 HISTORY — DX: Headache: R51

## 2018-06-24 HISTORY — DX: Unspecified osteoarthritis, unspecified site: M19.90

## 2018-06-24 HISTORY — DX: Pneumonia, unspecified organism: J18.9

## 2018-06-24 HISTORY — DX: Low back pain, unspecified: M54.50

## 2018-06-24 HISTORY — DX: Pure hypercholesterolemia, unspecified: E78.00

## 2018-06-24 HISTORY — DX: Cerebral infarction, unspecified: I63.9

## 2018-06-24 HISTORY — DX: Unspecified asthma, uncomplicated: J45.909

## 2018-06-24 HISTORY — DX: Low back pain: M54.5

## 2018-06-24 HISTORY — DX: Headache, unspecified: R51.9

## 2018-06-24 LAB — CBC WITH DIFFERENTIAL/PLATELET
Abs Immature Granulocytes: 0 10*3/uL (ref 0.0–0.1)
Basophils Absolute: 0.1 10*3/uL (ref 0.0–0.1)
Basophils Relative: 1 %
EOS PCT: 1 %
Eosinophils Absolute: 0.1 10*3/uL (ref 0.0–0.7)
HEMATOCRIT: 44.5 % (ref 39.0–52.0)
HEMOGLOBIN: 13.8 g/dL (ref 13.0–17.0)
Immature Granulocytes: 0 %
LYMPHS ABS: 2.3 10*3/uL (ref 0.7–4.0)
LYMPHS PCT: 47 %
MCH: 30 pg (ref 26.0–34.0)
MCHC: 31 g/dL (ref 30.0–36.0)
MCV: 96.7 fL (ref 78.0–100.0)
MONOS PCT: 7 %
Monocytes Absolute: 0.3 10*3/uL (ref 0.1–1.0)
Neutro Abs: 2.2 10*3/uL (ref 1.7–7.7)
Neutrophils Relative %: 44 %
Platelets: 193 10*3/uL (ref 150–400)
RBC: 4.6 MIL/uL (ref 4.22–5.81)
RDW: 14.7 % (ref 11.5–15.5)
WBC: 4.9 10*3/uL (ref 4.0–10.5)

## 2018-06-24 LAB — DIGOXIN LEVEL: Digoxin Level: <0.2 ng/mL — ABNORMAL LOW (ref 0.8–2.0)

## 2018-06-24 LAB — RAPID URINE DRUG SCREEN, HOSP PERFORMED
Amphetamines: NOT DETECTED
Barbiturates: NOT DETECTED
Benzodiazepines: NOT DETECTED
COCAINE: NOT DETECTED
OPIATES: NOT DETECTED
Tetrahydrocannabinol: NOT DETECTED

## 2018-06-24 LAB — COMPREHENSIVE METABOLIC PANEL
ALBUMIN: 3.7 g/dL (ref 3.5–5.0)
ALK PHOS: 60 U/L (ref 38–126)
ALT: 11 U/L (ref 0–44)
ANION GAP: 9 (ref 5–15)
AST: 19 U/L (ref 15–41)
BILIRUBIN TOTAL: 1 mg/dL (ref 0.3–1.2)
BUN: 9 mg/dL (ref 6–20)
CALCIUM: 9.4 mg/dL (ref 8.9–10.3)
CO2: 25 mmol/L (ref 22–32)
CREATININE: 1.03 mg/dL (ref 0.61–1.24)
Chloride: 107 mmol/L (ref 98–111)
Glucose, Bld: 105 mg/dL — ABNORMAL HIGH (ref 70–99)
Potassium: 4.3 mmol/L (ref 3.5–5.1)
Sodium: 141 mmol/L (ref 135–145)
TOTAL PROTEIN: 6.9 g/dL (ref 6.5–8.1)

## 2018-06-24 LAB — PROTIME-INR
INR: 1.13
Prothrombin Time: 14.4 seconds (ref 11.4–15.2)

## 2018-06-24 LAB — MAGNESIUM: MAGNESIUM: 1.7 mg/dL (ref 1.7–2.4)

## 2018-06-24 MED ORDER — PROCHLORPERAZINE EDISYLATE 10 MG/2ML IJ SOLN
10.0000 mg | Freq: Once | INTRAMUSCULAR | Status: AC
  Administered 2018-06-24: 10 mg via INTRAVENOUS
  Filled 2018-06-24: qty 2

## 2018-06-24 MED ORDER — DIPHENHYDRAMINE HCL 50 MG/ML IJ SOLN
25.0000 mg | Freq: Once | INTRAMUSCULAR | Status: AC
  Administered 2018-06-24: 25 mg via INTRAVENOUS
  Filled 2018-06-24: qty 1

## 2018-06-24 NOTE — ED Notes (Signed)
Patient transported to CT 

## 2018-06-24 NOTE — ED Notes (Signed)
Family visiting with patient at this time.

## 2018-06-24 NOTE — Progress Notes (Addendum)
Neurology Consultation  Reason for Consult: TIA versus get a migraine Referring Physician: Erma HeritageIsaacs  CC: Headache with difficulty expressing himself  History is obtained from: Patient  HPI: Ricardo Brooks is a 62 y.o. male hypertension.  Patient tells me that he has been having a migraine headache since Sunday at 1230 in the day.  Came on suddenly, located over the left orbital aspect of his eye and throbbing.  Has not been relieved since then.  Although he has not tried any medications to relieve it.  He states he is never had any type of neurological complications with his headaches in the past.  He is having difficulty expressing himself and telling a good history however I could patch together much of his history.  He states that he has been having migraine headaches since he was in his 30s but cannot tell me how often he gets them or if he is been on any prophylactic medication.  He has received a migraine cocktail since he has been here and states it has been brought down from an 8/10 down to a 4/10.  He does have a ptosis of his left eye but states that this is normal.  Admits that he is photophobic and phonophobic during these events. During the consultation he is bradyphrenic clearly having difficulty expressing self but does understand everything I am asking him to do.   LKW: 06/23/2018 at 1230 tpa given?: no, out of the window Premorbid modified Rankin scale (mRS): 1 NIH stroke scale of 1 ROS: A 14 point ROS was performed and is negative except as noted in the HPI.   Past Medical History:  Diagnosis Date  . Asthma   . Bipolar 1 disorder (HCC)   . Chronic back pain   . Hip pain, chronic   . Hypertension   . PTSD (post-traumatic stress disorder)     Stroke risk factors Essential (primary) hypertension   No family history on file.   Social History:   reports that he has been smoking cigarettes.  He has been smoking about 0.50 packs per day. He has never used smokeless tobacco.  He reports that he drinks alcohol. He reports that he does not use drugs.  Medications No current facility-administered medications for this encounter.   Current Outpatient Medications:  .  acetaminophen (TYLENOL) 500 MG tablet, Take 1,000 mg by mouth every 6 (six) hours as needed for mild pain. , Disp: , Rfl:  .  ARIPiprazole (ABILIFY) 15 MG tablet, Take 7.5 mg by mouth daily., Disp: , Rfl:  .  aspirin EC 81 MG EC tablet, Take 1 tablet (81 mg total) by mouth daily., Disp: 30 tablet, Rfl: 3 .  atorvastatin (LIPITOR) 80 MG tablet, Take 1 tablet (80 mg total) by mouth daily at 6 PM., Disp: 90 tablet, Rfl: 3 .  carvedilol (COREG) 3.125 MG tablet, Take 1 tablet (3.125 mg total) by mouth 2 (two) times daily with a meal., Disp: 60 tablet, Rfl: 6 .  Cholecalciferol (VITAMIN D PO), Take 1 tablet by mouth daily., Disp: , Rfl:  .  Cyanocobalamin (VITAMIN B-12 PO), Take 1 tablet by mouth daily., Disp: , Rfl:  .  cyclobenzaprine (FLEXERIL) 10 MG tablet, Take 1 tablet (10 mg total) by mouth 2 (two) times daily as needed for muscle spasms., Disp: 20 tablet, Rfl: 0 .  digoxin (LANOXIN) 0.125 MG tablet, Take 1 tablet (0.125 mg total) by mouth daily., Disp: 90 tablet, Rfl: 3 .  enoxaparin (LOVENOX) 60 MG/0.6ML injection, Inject 0.6  mLs (60 mg total) into the skin every 12 (twelve) hours., Disp: 14 Syringe, Rfl: 1 .  fluticasone (FLONASE) 50 MCG/ACT nasal spray, Place 2 sprays into both nostrils daily., Disp: 16 g, Rfl: 2 .  furosemide (LASIX) 40 MG tablet, Take 1 tablet (40 mg total) by mouth daily., Disp: 90 tablet, Rfl: 3 .  HYDROcodone-acetaminophen (NORCO/VICODIN) 5-325 MG tablet, Take 1 tablet by mouth every 4 (four) hours as needed., Disp: 10 tablet, Rfl: 0 .  ibuprofen (ADVIL,MOTRIN) 800 MG tablet, Take 1 tablet (800 mg total) by mouth every 8 (eight) hours as needed., Disp: 21 tablet, Rfl: 0 .  loratadine (CLARITIN) 10 MG tablet, Take 10 mg by mouth daily as needed for allergies. , Disp: , Rfl:  .   losartan (COZAAR) 25 MG tablet, Take 1 tablet (25 mg total) by mouth daily., Disp: 90 tablet, Rfl: 3 .  meclizine (ANTIVERT) 25 MG tablet, Take 1 tablet (25 mg total) by mouth 3 (three) times daily as needed for dizziness., Disp: 30 tablet, Rfl: 0 .  naproxen (NAPROSYN) 500 MG tablet, Take 1 tablet (500 mg total) by mouth 2 (two) times daily., Disp: 20 tablet, Rfl: 0 .  nitroGLYCERIN (NITROSTAT) 0.4 MG SL tablet, Place 1 tablet (0.4 mg total) under the tongue every 5 (five) minutes x 3 doses as needed for chest pain., Disp: 25 tablet, Rfl: 1 .  ondansetron (ZOFRAN) 4 MG tablet, Take 1 tablet (4 mg total) by mouth every 6 (six) hours., Disp: 12 tablet, Rfl: 0 .  spironolactone (ALDACTONE) 25 MG tablet, Take 0.5 tablets (12.5 mg total) by mouth at bedtime., Disp: 90 tablet, Rfl: 3 .  traMADol (ULTRAM) 50 MG tablet, Take 1-2 tablets (50-100 mg total) by mouth every 6 (six) hours as needed., Disp: 60 tablet, Rfl: 0 .  traMADol (ULTRAM) 50 MG tablet, Take 1-2 tablets (50-100 mg total) by mouth 3 (three) times daily as needed., Disp: 60 tablet, Rfl: 2 .  traMADol (ULTRAM) 50 MG tablet, TAKE 1  TAB PO DAILY, Disp: 20 tablet, Rfl: 0 .  traMADol (ULTRAM) 50 MG tablet, Take 1 tablet (50 mg total) by mouth 3 (three) times daily as needed., Disp: 30 tablet, Rfl: 2 .  traMADol (ULTRAM) 50 MG tablet, TAKE ONE TABLET BY MOUTH THREE TIMES A DAY. AS NEEDED, Disp: 30 tablet, Rfl: 0 .  warfarin (COUMADIN) 5 MG tablet, Take 1 tablet (5 mg total) by mouth one time only at 6 PM., Disp: 30 tablet, Rfl: 1   Exam: Current vital signs: BP 135/86   Pulse 72   Temp 97.7 F (36.5 C) (Oral)   Resp 20   Ht 5' 9.5" (1.765 m)   Wt 61.2 kg (135 lb)   SpO2 97%   BMI 19.65 kg/m  Vital signs in last 24 hours: Temp:  [97.7 F (36.5 C)] 97.7 F (36.5 C) (07/22 0857) Pulse Rate:  [52-81] 72 (07/22 1115) Resp:  [16-26] 20 (07/22 1030) BP: (121-135)/(83-93) 135/86 (07/22 1115) SpO2:  [97 %-100 %] 97 % (07/22 1115) Weight:   [61.2 kg (135 lb)] 61.2 kg (135 lb) (07/22 0851)  GENERAL: Awake, alert in NAD HEENT: - Normocephalic and atraumatic,  LUNGS - Clear to auscultation bilaterally with no wheezes CV - S1S2 RRR, no m/r/g, equal pulses bilaterally. ABDOMEN - Soft, nontender, nondistended with normoactive BS Ext: warm, well perfused, intact peripheral pulses,   NEURO:  Mental Status: AA&Ox3  Language: speech is clear non-dysarthric but is showing expressive aphasia.   comprehension intact.  Cranial Nerves: PERRL 27mm/brisk. EOMI, visual fields full, no facial asymmetry, facial sensation intact, hearing intact, tongue/uvula/soft palate midline, normal sternocleidomastoid and trapezius muscle strength.  Motor: 5/5 throughout Tone: is normal and bulk is normal Sensation- Intact to light touch bilaterally Coordination: FTN intact bilaterally* Gait- deferred  Labs I have reviewed labs in epic and the results pertinent to this consultation are:   CBC    Component Value Date/Time   WBC 4.9 06/24/2018 0900   RBC 4.60 06/24/2018 0900   HGB 13.8 06/24/2018 0900   HCT 44.5 06/24/2018 0900   PLT 193 06/24/2018 0900   MCV 96.7 06/24/2018 0900   MCH 30.0 06/24/2018 0900   MCHC 31.0 06/24/2018 0900   RDW 14.7 06/24/2018 0900   LYMPHSABS 2.3 06/24/2018 0900   MONOABS 0.3 06/24/2018 0900   EOSABS 0.1 06/24/2018 0900   BASOSABS 0.1 06/24/2018 0900    CMP     Component Value Date/Time   NA 141 06/24/2018 0900   NA 140 02/25/2018 1009   K 4.3 06/24/2018 0900   CL 107 06/24/2018 0900   CO2 25 06/24/2018 0900   GLUCOSE 105 (H) 06/24/2018 0900   BUN 9 06/24/2018 0900   BUN 16 02/25/2018 1009   CREATININE 1.03 06/24/2018 0900   CALCIUM 9.4 06/24/2018 0900   PROT 6.9 06/24/2018 0900   ALBUMIN 3.7 06/24/2018 0900   AST 19 06/24/2018 0900   ALT 11 06/24/2018 0900   ALKPHOS 60 06/24/2018 0900   BILITOT 1.0 06/24/2018 0900   GFRNONAA >60 06/24/2018 0900   GFRAA >60 06/24/2018 0900    Lipid Panel      Component Value Date/Time   CHOL 179 02/13/2018 0253   TRIG 120 02/13/2018 0253   HDL 37 (L) 02/13/2018 0253   CHOLHDL 4.8 02/13/2018 0253   VLDL 24 02/13/2018 0253   LDLCALC 118 (H) 02/13/2018 0253     Imaging I have reviewed the images obtained:  CT-scan of the brain--no intracranial abnormalities noted  MRI examination of the brain: Pending  Assessment: 62 year old male with chronic migraine headaches presenting to the hospital with a 1 day history of throbbing headache behind his left eye along with expressive aphasia.  At this time is difficult to ascertain if this is a CVA versus complicated migraine headache.    Recommendations: -MRI brain -May try migraine cocktail again if his headache worsens  NEUROHOSPITALIST ADDENDUM Seen and examined the patient today. I have reviewed the contents of history and physical exam as documented by PA/ARNP/Resident and agree with above documentation.  I have discussed and formulated the above plan as documented. Edits to the note have been made as needed.   Patient has a history of migraines, however has never had symptoms like this with his migraine before.  States that his trouble with speech waxes and wanes.  He also has a history of hypertension and on blood pressure medications.  On his examination, no obvious difficulty with naming objects, no trouble reading.  He does  have trouble with repetition and struggles with some sentences.  No right-sided weakness or neglect noted.  Has risk factors I do think he warrants an MRI brain to evaluate for stroke.  This is not a TIA as symptoms have persisted. If MRI of the brain is negative, diagnosis favors complicated migraine. Still endorsing headache.     Georgiana Spinner Aroor MD [facility] [phone]    #### Signed out MRI Brain to Dr Amada Jupiter- was positive for acute stroke. Likely due to  subtherapeutic INR with h/o LV thrombus.     If 7pm to 7am, please call on call  as listed on AMION.

## 2018-06-24 NOTE — ED Provider Notes (Signed)
[facility] EMERGENCY DEPARTMENT Provider Note   CSN: [id number] Arrival date & time: 06/24/18  0845     History   Chief Complaint Chief Complaint  Patient presents with  . Aphasia    HPI Ricardo Brooks is a 62 y.o. male.  HPI 62 year old male with history of bipolar disorder, hypertension, here with reported difficulty speaking.  Patient states he was normal when he went to bed last night.  He had a mild, left-sided headache but has a history of migraines.  He awoke this morning with severe left headache.  Headache is aching and throbbing.  It began gradually yesterday.  He has also noticed that he has some difficulty getting words out.  He states he is able to understand and think about what he wants to say, but has some difficulty articulating it.  No slurring of speech.  Denies any vision changes or focal numbness or weakness.  Denies known history of stroke.  Denies any chest pain or shortness of breath.  Denies any recent drug use.  He has not tried anything for his symptoms.  He does not note any specific aggravating factors.   Past Medical History:  Diagnosis Date  . Asthma   . Bipolar 1 disorder (HCC)   . Chronic back pain   . Hip pain, chronic   . Hypertension   . PTSD (post-traumatic stress disorder)     Patient Active Problem List   Diagnosis Date Noted  . CAD (coronary artery disease) 02/17/2018  . LV (left ventricular) mural thrombus without MI 02/17/2018  . Hypertension   . Chronic back pain   . Bipolar 1 disorder (HCC)   . Elevated troponin   . Acute systolic heart failure (HCC)   . SOB (shortness of breath) 02/12/2018  . Primary osteoarthritis of right hip 06/26/2017  . Primary osteoarthritis of left hip 06/26/2017    Past Surgical History:  Procedure Laterality Date  . orif right ankle Right 1985  . RIGHT/LEFT HEART CATH AND CORONARY ANGIOGRAPHY N/A 02/14/2018   Procedure: RIGHT/LEFT HEART CATH AND CORONARY ANGIOGRAPHY;  Surgeon: Yvonne Kendall, MD;  Location: [facility] INVASIVE CV LAB;  Service: Cardiovascular;  Laterality: N/A;        Home Medications    Prior to Admission medications   Medication Sig Start Date End Date Taking? Authorizing Provider  acetaminophen (TYLENOL) 500 MG tablet Take 1,000 mg by mouth every 6 (six) hours as needed for mild pain.     [provider]  ARIPiprazole (ABILIFY) 15 MG tablet Take 7.5 mg by mouth daily.    [provider]  aspirin EC 81 MG EC tablet Take 1 tablet (81 mg total) by mouth daily. 02/18/18   Duke, Roe Rutherford, PA  atorvastatin (LIPITOR) 80 MG tablet Take 1 tablet (80 mg total) by mouth daily at 6 PM. 02/17/18   Duke, Roe Rutherford, PA  carvedilol (COREG) 3.125 MG tablet Take 1 tablet (3.125 mg total) by mouth 2 (two) times daily with a meal. 02/17/18   Duke, Roe Rutherford, PA  Cholecalciferol (VITAMIN D PO) Take 1 tablet by mouth daily.    [provider]  Cyanocobalamin (VITAMIN B-12 PO) Take 1 tablet by mouth daily.    [provider]  cyclobenzaprine (FLEXERIL) 10 MG tablet Take 1 tablet (10 mg total) by mouth 2 (two) times daily as needed for muscle spasms. 04/22/16   Fayrene Helper, PA-C  digoxin (LANOXIN) 0.125 MG tablet Take 1 tablet (0.125 mg total) by  mouth daily. 02/18/18   Duke, Roe Rutherford, PA  enoxaparin (LOVENOX) 60 MG/0.6ML injection Inject 0.6 mLs (60 mg total) into the skin every 12 (twelve) hours. 02/17/18   Barrett, Joline Salt, PA-C  fluticasone (FLONASE) 50 MCG/ACT nasal spray Place 2 sprays into both nostrils daily. 03/03/17   Deborha Payment, PA-C  furosemide (LASIX) 40 MG tablet Take 1 tablet (40 mg total) by mouth daily. 02/18/18   Duke, Roe Rutherford, PA  HYDROcodone-acetaminophen (NORCO/VICODIN) 5-325 MG tablet Take 1 tablet by mouth every 4 (four) hours as needed. 08/17/17   Jacalyn Lefevre, MD  ibuprofen (ADVIL,MOTRIN) 800 MG tablet Take 1 tablet (800 mg total) by mouth every 8 (eight) hours as needed. 05/05/17   Long, Arlyss Repress,  MD  loratadine (CLARITIN) 10 MG tablet Take 10 mg by mouth daily as needed for allergies.  09/09/13   [name], MD  losartan (COZAAR) 25 MG tablet Take 1 tablet (25 mg total) by mouth daily. 02/18/18   Duke, Roe Rutherford, PA  meclizine (ANTIVERT) 25 MG tablet Take 1 tablet (25 mg total) by mouth 3 (three) times daily as needed for dizziness. 08/13/14   [name], MD  naproxen (NAPROSYN) 500 MG tablet Take 1 tablet (500 mg total) by mouth 2 (two) times daily. 01/23/16   Hedges, Tinnie Gens, PA-C  nitroGLYCERIN (NITROSTAT) 0.4 MG SL tablet Place 1 tablet (0.4 mg total) under the tongue every 5 (five) minutes x 3 doses as needed for chest pain. 02/17/18   Duke, Roe Rutherford, PA  ondansetron (ZOFRAN) 4 MG tablet Take 1 tablet (4 mg total) by mouth every 6 (six) hours. 07/09/14   [name], PA-C  spironolactone (ALDACTONE) 25 MG tablet Take 0.5 tablets (12.5 mg total) by mouth at bedtime. 02/17/18   Duke, Roe Rutherford, PA  traMADol (ULTRAM) 50 MG tablet Take 1-2 tablets (50-100 mg total) by mouth every 6 (six) hours as needed. 10/01/17   Kathryne Hitch, MD  traMADol (ULTRAM) 50 MG tablet Take 1-2 tablets (50-100 mg total) by mouth 3 (three) times daily as needed. 03/25/18   Cristie Hem, PA-C  traMADol (ULTRAM) 50 MG tablet TAKE 1  TAB PO DAILY 04/25/18   Tarry Kos, MD  traMADol (ULTRAM) 50 MG tablet Take 1 tablet (50 mg total) by mouth 3 (three) times daily as needed. 05/16/18   Tarry Kos, MD  traMADol (ULTRAM) 50 MG tablet TAKE ONE TABLET BY MOUTH THREE TIMES A DAY. AS NEEDED 06/07/18   Nadara Mustard, MD  warfarin (COUMADIN) 5 MG tablet Take 1 tablet (5 mg total) by mouth one time only at 6 PM. 02/17/18   Duke, Roe Rutherford, PA    Family History No family history on file.  Social History Social History   Tobacco Use  . Smoking status: Current Every Day Smoker    Packs/day: 0.50    Types: Cigarettes  . Smokeless tobacco: Never Used  Substance Use Topics  . Alcohol use: Yes      Comment: occ  . Drug use: No     Allergies   Methocarbamol   Review of Systems Review of Systems  Constitutional: Positive for fatigue. Negative for chills and fever.  HENT: Negative for congestion and rhinorrhea.   Eyes: Negative for visual disturbance.  Respiratory: Negative for cough, shortness of breath and wheezing.   Cardiovascular: Negative for chest pain and leg swelling.  Gastrointestinal: Negative for abdominal pain, diarrhea, nausea and vomiting.  Genitourinary: Negative for dysuria and flank  pain.  Musculoskeletal: Negative for neck pain and neck stiffness.  Skin: Negative for rash and wound.  Allergic/Immunologic: Negative for immunocompromised state.  Neurological: Positive for speech difficulty and headaches. Negative for syncope and weakness.  All other systems reviewed and are negative.    Physical Exam Updated Vital Signs BP 134/86   Pulse 64   Temp 97.7 F (36.5 C) (Oral)   Resp 17   Ht 5' 9.5" (1.765 m)   Wt 61.2 kg (135 lb)   SpO2 97%   BMI 19.65 kg/m   Physical Exam  Constitutional: He is oriented to person, place, and time. He appears well-developed and well-nourished. No distress.  HENT:  Head: Normocephalic and atraumatic.  Eyes: Conjunctivae are normal.  Neck: Neck supple.  Cardiovascular: Normal rate, regular rhythm and normal heart sounds. Exam reveals no friction rub.  No murmur heard. Pulmonary/Chest: Effort normal and breath sounds normal. No respiratory distress. He has no wheezes. He has no rales.  Abdominal: He exhibits no distension.  Musculoskeletal: He exhibits no edema.  Neurological: He is alert and oriented to person, place, and time. He exhibits normal muscle tone.  Skin: Skin is warm. Capillary refill takes less than 2 seconds.  Psychiatric: He has a normal mood and affect.  Nursing note and vitals reviewed.   Neurological Exam:  Mental Status: Alert and oriented to person, place, and time. Attention and  concentration normal. Speech clear. Possible mild expressive aphasia. Recent memory is intact. Cranial Nerves: Visual fields grossly intact. EOMI and PERRLA. No nystagmus noted. Facial sensation intact at forehead, maxillary cheek, and chin/mandible bilaterally. No facial asymmetry or weakness. Hearing grossly normal. Uvula is midline, and palate elevates symmetrically. Normal SCM and trapezius strength. Tongue midline without fasciculations. Motor: Muscle strength 5/5 in proximal and distal UE and LE bilaterally. No pronator drift. Muscle tone normal. Reflexes: 2+ and symmetrical in all four extremities.  Sensation: Intact to light touch in upper and lower extremities distally bilaterally.  Gait: Normal without ataxia. Coordination: Normal FTN bilaterally.    ED Treatments / Results  Labs (all labs ordered are listed, but only abnormal results are displayed) Labs Reviewed  COMPREHENSIVE METABOLIC PANEL - Abnormal; Notable for the following components:      Result Value   Glucose, Bld 105 (*)    All other components within normal limits  DIGOXIN LEVEL - Abnormal; Notable for the following components:   Digoxin Level <0.2 (*)    All other components within normal limits  CBC WITH DIFFERENTIAL/PLATELET  MAGNESIUM  RAPID URINE DRUG SCREEN, HOSP PERFORMED  PROTIME-INR    EKG EKG Interpretation  Date/Time:  Monday June 24 2018 08:54:36 EDT Ventricular Rate:  66 PR Interval:    QRS Duration: 93 QT Interval:  414 QTC Calculation: 434 R Axis:   -90 Text Interpretation:  Sinus rhythm Biatrial enlargement Left anterior fascicular block Left ventricular hypertrophy Nonspecific T abnormalities, lateral leads No significant change since last tracing Confirmed by Shaune Pollack [phone]) on 06/24/2018 9:01:16 AM   Radiology Ct Head Wo Contrast  Result Date: 06/24/2018 CLINICAL DATA:  Altered level of consciousness. Difficulty speaking. EXAM: CT HEAD WITHOUT CONTRAST TECHNIQUE: Contiguous  axial images were obtained from the base of the skull through the vertex without intravenous contrast. COMPARISON:  None. FINDINGS: Brain: Mild generalized volume loss with commensurate dilatation of the ventricles and sulci. No hydrocephalus. Mild chronic small vessel ischemic changes within the bilateral periventricular white matter. No mass, hemorrhage, edema or other evidence of acute parenchymal  abnormality. No extra-axial hemorrhage. Vascular: No hyperdense vessel or unexpected calcification. Skull: Normal. Negative for fracture or focal lesion. Sinuses/Orbits: Chronic appearing depression deformity of the medial LEFT orbital wall. No acute findings. Visualized upper paranasal sinuses are clear. Other: None. IMPRESSION: 1. No acute findings.  No intracranial mass, hemorrhage or edema. 2. Mild chronic small vessel ischemic changes within the white matter. Electronically Signed   By: Bary RichardStan  Maynard M.D.   On: 06/24/2018 09:46    Procedures Procedures (including critical care time)  Medications Ordered in ED Medications  prochlorperazine (COMPAZINE) injection 10 mg (10 mg Intravenous Given 06/24/18 0913)  diphenhydrAMINE (BENADRYL) injection 25 mg (25 mg Intravenous Given 06/24/18 0914)     Initial Impression / Assessment and Plan / ED Course  I have reviewed the triage vital signs and the nursing notes.  Pertinent labs & imaging results that were available during my care of the patient were reviewed by me and considered in my medical decision making (see chart for details).  Clinical Course as of Jun 24 1702  Mon Jun 24, 2018  [id number] 62 year old male with history as above here with subjective expressive a aphasia.  On exam, he has no other neurological deficits.  He is a VAN scale negative.  Last known normal was greater than 8 hours ago.  Differential includes CVA, TIA, complicated migraine.  Will give meds, CT, reassess.   [CI]  1700 Discussed with Neurology. Will check MR. Given persistent  sx, if MR is negative suspect complicated migraine. If positive, admit. I think that given atypical sx, also the fact that he is already optimized on tx for AFib with coumadin, suspect he can f/u as outpt if MR neg.   [CI]    Clinical Course User Index [CI] Shaune PollackIsaacs, Dayana Dalporto, MD     Final Clinical Impressions(s) / ED Diagnoses   Final diagnoses:  Difficulty speaking    ED Discharge Orders    None       Shaune PollackIsaacs, Jaki Steptoe, MD 06/24/18 [phone]

## 2018-06-24 NOTE — ED Triage Notes (Signed)
PT states went to bed at 1200 last night and when he woke up he wasn't speaking correctly.  Having difficulty speaking.  C/o headache.

## 2018-06-24 NOTE — ED Notes (Signed)
Patient transported to MRI 

## 2018-06-24 NOTE — ED Notes (Signed)
Patient has taken a shower and is awaiting MRI.  No complaints at this time.  MRI aware patient is ready for them.

## 2018-06-25 ENCOUNTER — Encounter (HOSPITAL_COMMUNITY): Payer: Self-pay | Admitting: Internal Medicine

## 2018-06-25 ENCOUNTER — Observation Stay (HOSPITAL_COMMUNITY): Payer: Medicaid Other

## 2018-06-25 ENCOUNTER — Other Ambulatory Visit: Payer: Self-pay

## 2018-06-25 DIAGNOSIS — I2510 Atherosclerotic heart disease of native coronary artery without angina pectoris: Secondary | ICD-10-CM | POA: Diagnosis present

## 2018-06-25 DIAGNOSIS — Z7982 Long term (current) use of aspirin: Secondary | ICD-10-CM | POA: Diagnosis not present

## 2018-06-25 DIAGNOSIS — I639 Cerebral infarction, unspecified: Secondary | ICD-10-CM

## 2018-06-25 DIAGNOSIS — G8929 Other chronic pain: Secondary | ICD-10-CM | POA: Diagnosis present

## 2018-06-25 DIAGNOSIS — I340 Nonrheumatic mitral (valve) insufficiency: Secondary | ICD-10-CM | POA: Diagnosis not present

## 2018-06-25 DIAGNOSIS — F17210 Nicotine dependence, cigarettes, uncomplicated: Secondary | ICD-10-CM | POA: Diagnosis present

## 2018-06-25 DIAGNOSIS — I255 Ischemic cardiomyopathy: Secondary | ICD-10-CM | POA: Diagnosis not present

## 2018-06-25 DIAGNOSIS — I444 Left anterior fascicular block: Secondary | ICD-10-CM | POA: Diagnosis present

## 2018-06-25 DIAGNOSIS — F319 Bipolar disorder, unspecified: Secondary | ICD-10-CM | POA: Diagnosis present

## 2018-06-25 DIAGNOSIS — R4701 Aphasia: Secondary | ICD-10-CM | POA: Diagnosis present

## 2018-06-25 DIAGNOSIS — E785 Hyperlipidemia, unspecified: Secondary | ICD-10-CM | POA: Diagnosis present

## 2018-06-25 DIAGNOSIS — I513 Intracardiac thrombosis, not elsewhere classified: Secondary | ICD-10-CM | POA: Diagnosis not present

## 2018-06-25 DIAGNOSIS — Z5321 Procedure and treatment not carried out due to patient leaving prior to being seen by health care provider: Secondary | ICD-10-CM | POA: Diagnosis present

## 2018-06-25 DIAGNOSIS — J45909 Unspecified asthma, uncomplicated: Secondary | ICD-10-CM | POA: Diagnosis present

## 2018-06-25 DIAGNOSIS — F4310 Post-traumatic stress disorder, unspecified: Secondary | ICD-10-CM | POA: Diagnosis present

## 2018-06-25 DIAGNOSIS — I10 Essential (primary) hypertension: Secondary | ICD-10-CM | POA: Diagnosis not present

## 2018-06-25 DIAGNOSIS — I5022 Chronic systolic (congestive) heart failure: Secondary | ICD-10-CM | POA: Diagnosis present

## 2018-06-25 DIAGNOSIS — Z9089 Acquired absence of other organs: Secondary | ICD-10-CM | POA: Diagnosis not present

## 2018-06-25 DIAGNOSIS — R29701 NIHSS score 1: Secondary | ICD-10-CM | POA: Diagnosis present

## 2018-06-25 DIAGNOSIS — I110 Hypertensive heart disease with heart failure: Secondary | ICD-10-CM | POA: Diagnosis present

## 2018-06-25 DIAGNOSIS — G43909 Migraine, unspecified, not intractable, without status migrainosus: Secondary | ICD-10-CM | POA: Diagnosis present

## 2018-06-25 DIAGNOSIS — M545 Low back pain: Secondary | ICD-10-CM | POA: Diagnosis present

## 2018-06-25 DIAGNOSIS — I361 Nonrheumatic tricuspid (valve) insufficiency: Secondary | ICD-10-CM | POA: Diagnosis not present

## 2018-06-25 DIAGNOSIS — R479 Unspecified speech disturbances: Secondary | ICD-10-CM | POA: Diagnosis not present

## 2018-06-25 DIAGNOSIS — M170 Bilateral primary osteoarthritis of knee: Secondary | ICD-10-CM | POA: Diagnosis present

## 2018-06-25 DIAGNOSIS — I6349 Cerebral infarction due to embolism of other cerebral artery: Secondary | ICD-10-CM | POA: Diagnosis not present

## 2018-06-25 DIAGNOSIS — T45516A Underdosing of anticoagulants, initial encounter: Secondary | ICD-10-CM | POA: Diagnosis present

## 2018-06-25 DIAGNOSIS — H02402 Unspecified ptosis of left eyelid: Secondary | ICD-10-CM | POA: Diagnosis present

## 2018-06-25 DIAGNOSIS — Z91128 Patient's intentional underdosing of medication regimen for other reason: Secondary | ICD-10-CM | POA: Diagnosis not present

## 2018-06-25 LAB — HEMOGLOBIN A1C
HEMOGLOBIN A1C: 5.6 % (ref 4.8–5.6)
MEAN PLASMA GLUCOSE: 114.02 mg/dL

## 2018-06-25 LAB — CBC
HCT: 40.6 % (ref 39.0–52.0)
HEMOGLOBIN: 12.9 g/dL — AB (ref 13.0–17.0)
MCH: 30.7 pg (ref 26.0–34.0)
MCHC: 31.8 g/dL (ref 30.0–36.0)
MCV: 96.7 fL (ref 78.0–100.0)
PLATELETS: 168 10*3/uL (ref 150–400)
RBC: 4.2 MIL/uL — AB (ref 4.22–5.81)
RDW: 14.6 % (ref 11.5–15.5)
WBC: 4.9 10*3/uL (ref 4.0–10.5)

## 2018-06-25 LAB — DIGOXIN LEVEL

## 2018-06-25 LAB — COMPREHENSIVE METABOLIC PANEL
ALK PHOS: 56 U/L (ref 38–126)
ALT: 9 U/L (ref 0–44)
ANION GAP: 9 (ref 5–15)
AST: 17 U/L (ref 15–41)
Albumin: 3.2 g/dL — ABNORMAL LOW (ref 3.5–5.0)
BUN: 11 mg/dL (ref 6–20)
CALCIUM: 8.9 mg/dL (ref 8.9–10.3)
CO2: 25 mmol/L (ref 22–32)
CREATININE: 0.98 mg/dL (ref 0.61–1.24)
Chloride: 106 mmol/L (ref 98–111)
GFR calc Af Amer: >60 mL/min (ref >60)
Glucose, Bld: 93 mg/dL (ref 70–99)
Potassium: 4.5 mmol/L (ref 3.5–5.1)
SODIUM: 140 mmol/L (ref 135–145)
TOTAL PROTEIN: 6 g/dL — AB (ref 6.5–8.1)
Total Bilirubin: 0.9 mg/dL (ref 0.3–1.2)

## 2018-06-25 LAB — HEPARIN LEVEL (UNFRACTIONATED)

## 2018-06-25 LAB — LIPID PANEL
CHOL/HDL RATIO: 4.7 ratio
Cholesterol: 183 mg/dL (ref 0–200)
HDL: 39 mg/dL — ABNORMAL LOW (ref >40)
LDL Cholesterol: 132 mg/dL — ABNORMAL HIGH (ref 0–99)
TRIGLYCERIDES: 61 mg/dL (ref <150)
VLDL: 12 mg/dL (ref 0–40)

## 2018-06-25 MED ORDER — ASPIRIN 300 MG RE SUPP: 300.0000 mg | Freq: Every day | RECTAL | Status: DC

## 2018-06-25 MED ORDER — ACETAMINOPHEN 160 MG/5ML PO SOLN: 650.0000 mg | ORAL | Status: DC | PRN

## 2018-06-25 MED ORDER — DIGOXIN 125 MCG PO TABS
0.1250 mg | ORAL_TABLET | Freq: Every day | ORAL | Status: DC
  Administered 2018-06-25 – 2018-06-26 (×2): 0.125 mg via ORAL
  Filled 2018-06-25 (×3): qty 1

## 2018-06-25 MED ORDER — STROKE: EARLY STAGES OF RECOVERY BOOK
Freq: Once | Status: AC
  Administered 2018-06-25: 21:00:00
  Filled 2018-06-25 (×2): qty 1

## 2018-06-25 MED ORDER — CARVEDILOL 3.125 MG PO TABS
3.1250 mg | ORAL_TABLET | Freq: Two times a day (BID) | ORAL | Status: DC
  Administered 2018-06-25 – 2018-06-26 (×4): 3.125 mg via ORAL
  Filled 2018-06-25 (×6): qty 1

## 2018-06-25 MED ORDER — LOSARTAN POTASSIUM 25 MG PO TABS
25.0000 mg | ORAL_TABLET | Freq: Every day | ORAL | Status: DC
  Administered 2018-06-25 – 2018-06-26 (×2): 25 mg via ORAL
  Filled 2018-06-25 (×4): qty 1

## 2018-06-25 MED ORDER — ACETAMINOPHEN 325 MG PO TABS: 650.0000 mg | ORAL_TABLET | ORAL | Status: DC | PRN

## 2018-06-25 MED ORDER — ENOXAPARIN SODIUM 40 MG/0.4ML ~~LOC~~ SOLN
40.0000 mg | Freq: Every day | SUBCUTANEOUS | Status: DC
  Filled 2018-06-25 (×2): qty 0.4

## 2018-06-25 MED ORDER — ACETAMINOPHEN 650 MG RE SUPP: 650.0000 mg | RECTAL | Status: DC | PRN

## 2018-06-25 MED ORDER — ATORVASTATIN CALCIUM 80 MG PO TABS
80.0000 mg | ORAL_TABLET | Freq: Every day | ORAL | Status: DC
  Administered 2018-06-25 – 2018-06-26 (×2): 80 mg via ORAL
  Filled 2018-06-25 (×2): qty 1

## 2018-06-25 MED ORDER — HEPARIN (PORCINE) IN NACL 100-0.45 UNIT/ML-% IJ SOLN
950.0000 [IU]/h | INTRAMUSCULAR | Status: DC
  Administered 2018-06-25: 750 [IU]/h via INTRAVENOUS
  Administered 2018-06-26: 950 [IU]/h via INTRAVENOUS
  Filled 2018-06-25 (×2): qty 250

## 2018-06-25 MED ORDER — ASPIRIN 325 MG PO TABS
325.0000 mg | ORAL_TABLET | Freq: Every day | ORAL | Status: DC
  Administered 2018-06-25 – 2018-06-26 (×2): 325 mg via ORAL
  Filled 2018-06-25 (×3): qty 1

## 2018-06-25 MED ORDER — TRAMADOL HCL 50 MG PO TABS
50.0000 mg | ORAL_TABLET | Freq: Four times a day (QID) | ORAL | Status: DC | PRN
  Administered 2018-06-26: 50 mg via ORAL
  Filled 2018-06-25: qty 2

## 2018-06-25 MED ORDER — WARFARIN SODIUM 7.5 MG PO TABS
7.5000 mg | ORAL_TABLET | Freq: Once | ORAL | Status: AC
  Administered 2018-06-25: 7.5 mg via ORAL
  Filled 2018-06-25: qty 1

## 2018-06-25 MED ORDER — WARFARIN - PHARMACIST DOSING INPATIENT
Freq: Every day | Status: DC
  Administered 2018-06-25: 18:00:00

## 2018-06-25 NOTE — Evaluation (Signed)
Occupational Therapy Evaluation Patient Details Name: Ricardo Brooks MRN: [id number] DOB: 06-Aug-1956 Today's Date: 06/25/2018    History of Present Illness 62 yo admitted with difficulty speaking for 2 days with left parietal CVA. pMhx: ICM, CAD, HTn, back pain, bipolar, pTSD   Clinical Impression   This 62 y/o male presents with the above. At baseline pt reports independence with ADLs, iADLs and functional mobility. Pt demonstrating functional mobility, tub transfer and standing grooming ADLs without AD and supervision throughout. Pt does present with speech and word finding difficulties during session, intermittently requiring increased time when responding to certain questions. Education provided on safety during ADL and mobility tasks after return home with questions answered throughout. No further acute OT needs identified at this time, do not anticipate pt will require follow up OT services. Acute OT to sign off.     Follow Up Recommendations  No OT follow up;Supervision - Intermittent    Equipment Recommendations  None recommended by OT           Precautions / Restrictions Precautions Precautions: None Restrictions Weight Bearing Restrictions: No      Mobility Bed Mobility Overal bed mobility: Independent             General bed mobility comments: OOB in recliner   Transfers Overall transfer level: Independent                    Balance Overall balance assessment: No apparent balance deficits (not formally assessed)                               Standardized Balance Assessment Standardized Balance Assessment : Dynamic Gait Index   Dynamic Gait Index Level Surface: Normal Change in Gait Speed: Normal Gait with Horizontal Head Turns: Normal Gait with Vertical Head Turns: Normal Gait and Pivot Turn: Normal Step Over Obstacle: Normal Step Around Obstacles: Normal Steps: Normal Total Score: 24     ADL either performed or assessed with  clinical judgement   ADL Overall ADL's : At baseline                                       General ADL Comments: pt demonstrating functional mobility without AD, LB ADL, tub transfer and standing grooming ADLs with overall supervision this session, no overt LOB      Vision Baseline Vision/History: No visual deficits Patient Visual Report: No change from baseline Vision Assessment?: No apparent visual deficits     Perception     Praxis      Pertinent Vitals/Pain Pain Assessment: No/denies pain     Hand Dominance Right   Extremity/Trunk Assessment Upper Extremity Assessment Upper Extremity Assessment: Overall WFL for tasks assessed   Lower Extremity Assessment Lower Extremity Assessment: Overall WFL for tasks assessed   Cervical / Trunk Assessment Cervical / Trunk Assessment: Normal   Communication Communication Communication: Expressive difficulties(word finding difficulties at times)   Cognition Arousal/Alertness: Awake/alert Behavior During Therapy: WFL for tasks assessed/performed Overall Cognitive Status: Within Functional Limits for tasks assessed                                 General Comments: requires cues when provided with higher level cognitive challenges, though suspect may partly be due to expressive difficulties vs cognition  General Comments       Exercises     Shoulder Instructions      Home Living Family/patient expects to be discharged to:: Private residence Living Arrangements: Alone Available Help at Discharge: Friend(s);Available PRN/intermittently Type of Home: Apartment Home Access: Stairs to enter Entrance Stairs-Number of Steps: 14   Home Layout: One level     Bathroom Shower/Tub: Chief Strategy OfficerTub/shower unit   Bathroom Toilet: Standard     Home Equipment: None          Prior Functioning/Environment Level of Independence: Independent                 OT Problem List: Decreased activity  tolerance      OT Treatment/Interventions:      OT Goals(Current goals can be found in the care plan section) Acute Rehab OT Goals Patient Stated Goal: return home  OT Goal Formulation: All assessment and education complete, DC therapy  OT Frequency:     Barriers to D/C:            Co-evaluation              AM-PAC PT "6 Clicks" Daily Activity     Outcome Measure Help from another person eating meals?: None Help from another person taking care of personal grooming?: None Help from another person toileting, which includes using toliet, bedpan, or urinal?: None Help from another person bathing (including washing, rinsing, drying)?: None Help from another person to put on and taking off regular upper body clothing?: None Help from another person to put on and taking off regular lower body clothing?: None 6 Click Score: 24   End of Session Equipment Utilized During Treatment: Gait belt Nurse Communication: Mobility status  Activity Tolerance: Patient tolerated treatment well Patient left: in chair  OT Visit Diagnosis: Other symptoms and signs involving the nervous system (R29.898)                Time: [id number] OT Time Calculation (min): 21 min Charges:  OT General Charges $OT Visit: 1 Visit OT Evaluation $OT Eval Moderate Complexity: 1 Mod G-Codes:     Marcy SirenBreanna Ayushi Pla, OT Pager [phone] 06/25/2018   Orlando PennerBreanna L Bryer Gottsch 06/25/2018, 4:27 PM

## 2018-06-25 NOTE — ED Notes (Signed)
Report given to [facility]- pt will be transported once he returns from [facility]

## 2018-06-25 NOTE — Progress Notes (Signed)
Patient is a 62 year old male with past medical history of ischemic cardiomyopathy, coronary disease, hypertension, left ventricular thrombus on coumadin, chronic back pain, bipolar disorder , who presents to the emergency department with complaints of dysarthria.  MRI of the brain done in the emergency department showed multifocal stroke. His INR was subtherapeutic.  Neurology is already following.  Neurology has started him on heparin drip.  Pending echocardiogram for the follow-up of left ventricle thrombus.  Patient seen and examined the bedside this morning.  Remains comfortable.  Speech is clear.  Does not have any focal neurologic deficits. Patient seen by Dr. Toniann FailKakrakandy this morning.

## 2018-06-25 NOTE — Evaluation (Signed)
Speech Language Pathology Evaluation Patient Details Name: Ricardo Brooks MRN: [id number] DOB: [birth year] Today's Date: 06/25/2018 Time: [id number] SLP Time Calculation (min) (ACUTE ONLY): 28 min  Problem List:  Patient Active Problem List   Diagnosis Date Noted  . Ischemic cardiomyopathy 06/25/2018  . Acute CVA (cerebrovascular accident) (HCC) 06/24/2018  . CAD (coronary artery disease) 02/17/2018  . LV (left ventricular) mural thrombus without MI 02/17/2018  . Hypertension   . Chronic back pain   . Bipolar 1 disorder (HCC)   . Elevated troponin   . Acute systolic heart failure (HCC)   . SOB (shortness of breath) 02/12/2018  . Primary osteoarthritis of right hip 06/26/2017  . Primary osteoarthritis of left hip 06/26/2017   Past Medical History:  Past Medical History:  Diagnosis Date  . Asthma   . Bipolar 1 disorder (HCC)   . Chronic back pain   . Hip pain, chronic   . Hypertension   . PTSD (post-traumatic stress disorder)    Past Surgical History:  Past Surgical History:  Procedure Laterality Date  . orif right ankle Right 1985  . RIGHT/LEFT HEART CATH AND CORONARY ANGIOGRAPHY N/A 02/14/2018   Procedure: RIGHT/LEFT HEART CATH AND CORONARY ANGIOGRAPHY;  Surgeon: Yvonne KendallEnd, Christopher, MD;  Location: [facility];  Service: Cardiovascular;  Laterality: N/A;   HPI:  Ricardo Brooks is a 62 y.o. male with history of ischemic cardiomyopathy CAD, hypertension, chronic back pain, bipolar disorder, tobacco abuse, LV thrombus on Coumadin presents to the ER because of persistent difficulty talking for the last 2 days.  Denies any difficulty ambulating or any weakness of the extremities or any visual symptoms.  Denies any difficulty swallowing.  Since patient has been ongoing difficulty with bringing out words for last 2 days patient came to the ER.  In addition patient also has been having left temporal headache.   Assessment / Plan / Recommendation Clinical Impression  Patient came to  hospital after having problems with his speech. MRI shows acute infarcts.  Patient is oriented X 4, attention is [facility]FL, comprehension is [facility]FL. Patient has mild to moderate expressive aphasia. He feels his speech problems have improved since onset. He is aware of his speech errors (aphasia) and often able to self correct and/or slow his speech to increase accuracy. Recommend speech therapy to address language deficits and ongoing assessment.    SLP Assessment  SLP Recommendation/Assessment: Patient needs continued Speech Lanaguage Pathology Services    Follow Up Recommendations       Frequency and Duration min 2x/week  2 weeks      SLP Evaluation Cognition  Overall Cognitive Status: Within Functional Limits for tasks assessed Arousal/Alertness: Awake/alert Orientation Level: Oriented to person;Oriented to place;Disoriented to time;Oriented to situation Attention: Focused Memory: Appears intact Awareness: Appears intact Problem Solving: Appears intact Executive Function: Reasoning Reasoning: Appears intact Safety/Judgment: Appears intact       Comprehension  Auditory Comprehension Overall Auditory Comprehension: Appears within functional limits for tasks assessed Yes/No Questions: Within Functional Limits Commands: Within Functional Limits Conversation: Simple Visual Recognition/Discrimination Discrimination: Within Function Limits Reading Comprehension Reading Status: Within funtional limits    Expression Expression Primary Mode of Expression: Verbal Verbal Expression Overall Verbal Expression: Impaired Initiation: No impairment Level of Generative/Spontaneous Verbalization: Sentence Repetition: No impairment Naming: Impairment Responsive: 51-75% accurate Verbal Errors: Aware of errors Pragmatics: No impairment Effective Techniques: Phonemic cues Written Expression Dominant Hand: Right Written Expression: Not tested   Oral / Motor  Oral Motor/Sensory Function Overall  Oral Motor/Sensory Function:  Within functional limits Motor Speech Overall Motor Speech: Appears within functional limits for tasks assessed Respiration: Within functional limits Phonation: Normal Resonance: Within functional limits Articulation: Within functional limitis Intelligibility: Intelligible Motor Planning: Witnin functional limits Motor Speech Errors: Not applicable   GO                    Lindalou Hose Tallia Moehring, MA, CCC-SLP 06/25/2018 1:48 PM

## 2018-06-25 NOTE — H&P (Signed)
History and Physical    [name] ZOX:[id number] DOB: 04-27-1956 DOA: 06/24/2018  PCP: Gilda Crease, MD  Patient coming from: Home.  Chief Complaint: Difficulty speaking.  HPI: Ricardo Brooks is a 62 y.o. male with history of ischemic cardiomyopathy CAD, hypertension, chronic back pain, bipolar disorder, tobacco abuse, LV thrombus on Coumadin presents to the ER because of persistent difficulty talking for the last 2 days.  Denies any difficulty ambulating or any weakness of the extremities or any visual symptoms.  Denies any difficulty swallowing.  Since patient has been ongoing difficulty with bringing out words for last 2 days patient came to the ER.  In addition patient also has been having left temporal headache.  ED Course: In the ER patient was evaluated by neurologist.  For the headache patient was given Compazine and Benadryl suspecting could be migraine.  MRI brain showed multifocal stroke.  Patient admitted for further management.  Patient passed swallow evaluation.  Patient's INR is subtherapeutic.  Review of Systems: As per HPI, rest all negative.   Past Medical History:  Diagnosis Date  . Asthma   . Bipolar 1 disorder (HCC)   . Chronic back pain   . Hip pain, chronic   . Hypertension   . PTSD (post-traumatic stress disorder)     Past Surgical History:  Procedure Laterality Date  . orif right ankle Right 1985  . RIGHT/LEFT HEART CATH AND CORONARY ANGIOGRAPHY N/A 02/14/2018   Procedure: RIGHT/LEFT HEART CATH AND CORONARY ANGIOGRAPHY;  Surgeon: Yvonne Kendall, MD;  Location: [facility];  Service: Cardiovascular;  Laterality: N/A;     reports that he has been smoking cigarettes.  He has been smoking about 0.50 packs per day. He has never used smokeless tobacco. He reports that he drinks alcohol. He reports that he does not use drugs.  Allergies  Allergen Reactions  . Methocarbamol Nausea And Vomiting and Anxiety    History reviewed. No pertinent  family history.  Prior to Admission medications   Medication Sig Start Date End Date Taking? Authorizing Provider  acetaminophen (TYLENOL) 500 MG tablet Take 1,000 mg by mouth every 6 (six) hours as needed for mild pain.     [provider]  ARIPiprazole (ABILIFY) 15 MG tablet Take 7.5 mg by mouth daily.    [provider]  aspirin EC 81 MG EC tablet Take 1 tablet (81 mg total) by mouth daily. 02/18/18   Duke, Roe Rutherford, PA  atorvastatin (LIPITOR) 80 MG tablet Take 1 tablet (80 mg total) by mouth daily at 6 PM. 02/17/18   Duke, Roe Rutherford, PA  carvedilol (COREG) 3.125 MG tablet Take 1 tablet (3.125 mg total) by mouth 2 (two) times daily with a meal. 02/17/18   Duke, Roe Rutherford, PA  Cholecalciferol (VITAMIN D PO) Take 1 tablet by mouth daily.    [provider]  Cyanocobalamin (VITAMIN B-12 PO) Take 1 tablet by mouth daily.    [provider]  cyclobenzaprine (FLEXERIL) 10 MG tablet Take 1 tablet (10 mg total) by mouth 2 (two) times daily as needed for muscle spasms. 04/22/16   Fayrene Helper, PA-C  digoxin (LANOXIN) 0.125 MG tablet Take 1 tablet (0.125 mg total) by mouth daily. 02/18/18   Duke, Roe Rutherford, PA  enoxaparin (LOVENOX) 60 MG/0.6ML injection Inject 0.6 mLs (60 mg total) into the skin every 12 (twelve) hours. 02/17/18   Barrett, Joline Salt, PA-C  fluticasone (FLONASE) 50 MCG/ACT nasal spray Place 2 sprays into both nostrils daily.  03/03/17   Deborha PaymentMeyer, Ashley L, PA-C  furosemide (LASIX) 40 MG tablet Take 1 tablet (40 mg total) by mouth daily. 02/18/18   Duke, Roe RutherfordAngela Nicole, PA  HYDROcodone-acetaminophen (NORCO/VICODIN) 5-325 MG tablet Take 1 tablet by mouth every 4 (four) hours as needed. 08/17/17   Jacalyn LefevreHaviland, Julie, MD  ibuprofen (ADVIL,MOTRIN) 800 MG tablet Take 1 tablet (800 mg total) by mouth every 8 (eight) hours as needed. 05/05/17   Long, Arlyss RepressJoshua G, MD  loratadine (CLARITIN) 10 MG tablet Take 10 mg by mouth daily as needed for allergies.  09/09/13   Linwood DibblesKnapp,  Jon, MD  losartan (COZAAR) 25 MG tablet Take 1 tablet (25 mg total) by mouth daily. 02/18/18   Duke, Roe RutherfordAngela Nicole, PA  meclizine (ANTIVERT) 25 MG tablet Take 1 tablet (25 mg total) by mouth 3 (three) times daily as needed for dizziness. 08/13/14   Dione BoozeGlick, Lessie, MD  naproxen (NAPROSYN) 500 MG tablet Take 1 tablet (500 mg total) by mouth 2 (two) times daily. 01/23/16   Hedges, Tinnie GensJeffrey, PA-C  nitroGLYCERIN (NITROSTAT) 0.4 MG SL tablet Place 1 tablet (0.4 mg total) under the tongue every 5 (five) minutes x 3 doses as needed for chest pain. 02/17/18   Duke, Roe RutherfordAngela Nicole, PA  ondansetron (ZOFRAN) 4 MG tablet Take 1 tablet (4 mg total) by mouth every 6 (six) hours. 07/09/14   Marlon PelGreene, Tiffany, PA-C  spironolactone (ALDACTONE) 25 MG tablet Take 0.5 tablets (12.5 mg total) by mouth at bedtime. 02/17/18   Duke, Roe RutherfordAngela Nicole, PA  traMADol (ULTRAM) 50 MG tablet Take 1-2 tablets (50-100 mg total) by mouth every 6 (six) hours as needed. 10/01/17   Kathryne HitchBlackman, Christopher Y, MD  traMADol (ULTRAM) 50 MG tablet Take 1-2 tablets (50-100 mg total) by mouth 3 (three) times daily as needed. 03/25/18   Cristie HemStanbery, Mary L, PA-C  traMADol (ULTRAM) 50 MG tablet TAKE 1  TAB PO DAILY 04/25/18   Tarry KosXu, Naiping M, MD  traMADol (ULTRAM) 50 MG tablet Take 1 tablet (50 mg total) by mouth 3 (three) times daily as needed. 05/16/18   Tarry KosXu, Naiping M, MD  traMADol (ULTRAM) 50 MG tablet TAKE ONE TABLET BY MOUTH THREE TIMES A DAY. AS NEEDED 06/07/18   Nadara Mustarduda, Marcus V, MD  warfarin (COUMADIN) 5 MG tablet Take 1 tablet (5 mg total) by mouth one time only at 6 PM. 02/17/18   Marcelino Dusteruke, Angela Nicole, PA    Physical Exam: Vitals:   06/25/18 0115 06/25/18 0130 06/25/18 0145 06/25/18 0200  BP: 130/81 (!) 131/91 133/86 137/90  Pulse: 67 69 68 68  Resp: (!) 26 (!) 25 (!) 26 (!) 29  Temp:      TempSrc:      SpO2: 96% 97% 96% 98%  Weight:      Height:          Constitutional: Moderately built and nourished. Vitals:   06/25/18 0115 06/25/18 0130 06/25/18  0145 06/25/18 0200  BP: 130/81 (!) 131/91 133/86 137/90  Pulse: 67 69 68 68  Resp: (!) 26 (!) 25 (!) 26 (!) 29  Temp:      TempSrc:      SpO2: 96% 97% 96% 98%  Weight:      Height:       Eyes: Anicteric no pallor. ENMT: No discharge from the ears eyes nose or mouth. Neck: No mass felt.  No neck rigidity but no JVD appreciated. Respiratory: No rhonchi or crepitations. Cardiovascular: S1-S2 heard no murmurs appreciated. Abdomen: Soft nontender bowel sounds present. Musculoskeletal:  No edema.  No joint effusion. Skin: No rash. Neurologic: Alert awake oriented to time place and person.  Moves all extremities 5 x 5.  No facial asymmetry tongue is midline.  Pupils are equal and reacting to light. Psychiatric: Appears normal per normal affect.   Labs on Admission: I have personally reviewed following labs and imaging studies  CBC: Recent Labs  Lab 06/24/18 0900  WBC 4.9  NEUTROABS 2.2  HGB 13.8  HCT 44.5  MCV 96.7  PLT 193   Basic Metabolic Panel: Recent Labs  Lab 06/24/18 0900  NA 141  K 4.3  CL 107  CO2 25  GLUCOSE 105*  BUN 9  CREATININE 1.03  CALCIUM 9.4  MG 1.7   GFR: Estimated Creatinine Clearance: 66.8 mL/min (by C-G formula based on SCr of 1.03 mg/dL). Liver Function Tests: Recent Labs  Lab 06/24/18 0900  AST 19  ALT 11  ALKPHOS 60  BILITOT 1.0  PROT 6.9  ALBUMIN 3.7   No results for input(s): LIPASE, AMYLASE in the last 168 hours. No results for input(s): AMMONIA in the last 168 hours. Coagulation Profile: Recent Labs  Lab 06/24/18 1910  INR 1.13   Cardiac Enzymes: No results for input(s): CKTOTAL, CKMB, CKMBINDEX, TROPONINI in the last 168 hours. BNP (last 3 results) No results for input(s): PROBNP in the last 8760 hours. HbA1C: No results for input(s): HGBA1C in the last 72 hours. CBG: No results for input(s): GLUCAP in the last 168 hours. Lipid Profile: No results for input(s): CHOL, HDL, LDLCALC, TRIG, CHOLHDL, LDLDIRECT in the last  72 hours. Thyroid Function Tests: No results for input(s): TSH, T4TOTAL, FREET4, T3FREE, THYROIDAB in the last 72 hours. Anemia Panel: No results for input(s): VITAMINB12, FOLATE, FERRITIN, TIBC, IRON, RETICCTPCT in the last 72 hours. Urine analysis: No results found for: COLORURINE, APPEARANCEUR, LABSPEC, PHURINE, GLUCOSEU, HGBUR, BILIRUBINUR, KETONESUR, PROTEINUR, UROBILINOGEN, NITRITE, LEUKOCYTESUR Sepsis Labs: @LABRCNTIP (procalcitonin:4,lacticidven:4) )No results found for this or any previous visit (from the past 240 hour(s)).   Radiological Exams on Admission: Ct Head Wo Contrast  Result Date: 06/24/2018 CLINICAL DATA:  Altered level of consciousness. Difficulty speaking. EXAM: CT HEAD WITHOUT CONTRAST TECHNIQUE: Contiguous axial images were obtained from the base of the skull through the vertex without intravenous contrast. COMPARISON:  None. FINDINGS: Brain: Mild generalized volume loss with commensurate dilatation of the ventricles and sulci. No hydrocephalus. Mild chronic small vessel ischemic changes within the bilateral periventricular white matter. No mass, hemorrhage, edema or other evidence of acute parenchymal abnormality. No extra-axial hemorrhage. Vascular: No hyperdense vessel or unexpected calcification. Skull: Normal. Negative for fracture or focal lesion. Sinuses/Orbits: Chronic appearing depression deformity of the medial LEFT orbital wall. No acute findings. Visualized upper paranasal sinuses are clear. Other: None. IMPRESSION: 1. No acute findings.  No intracranial mass, hemorrhage or edema. 2. Mild chronic small vessel ischemic changes within the white matter. Electronically Signed   By: Bary Richard M.D.   On: 06/24/2018 09:46   Mr Brain Wo Contrast  Result Date: 06/24/2018 CLINICAL DATA:  Focal neurologic deficit.  Aphasia.  Headache. EXAM: MRI HEAD WITHOUT CONTRAST TECHNIQUE: Multiplanar, multiecho pulse sequences of the brain and surrounding structures were obtained  without intravenous contrast. COMPARISON:  Head CT 06/24/2018 FINDINGS: BRAIN: Medium-sized area abnormal diffusion restriction in the left parietal lobe with a small, 5 mm focus also in the right frontal lobe. The midline structures are normal. There is hyperintense T2-weighted signal corresponding to the areas of diffusion restriction. Multifocal white matter hyperintensity, most  commonly due to chronic ischemic microangiopathy. The CSF spaces are normal for age, with no hydrocephalus. 5 mm focus of magnetic susceptibility effect in the posterior left parietal lobe with associated hyperintensity on T1-weighted imaging is likely a small cavernous angioma. No acute hemorrhage. VASCULAR: Major intracranial arterial and venous sinus flow voids are preserved. SKULL AND UPPER CERVICAL SPINE: The visualized skull base, calvarium, upper cervical spine and extracranial soft tissues are normal. SINUSES/ORBITS: No fluid levels or advanced mucosal thickening. No mastoid or middle ear effusion. The orbits are normal. IMPRESSION: 1. Multifocal acute ischemia with left parietal lobe infarct measuring up to 17 mm and small right frontal infarct measuring up to 5 mm. No hemorrhage or mass effect. 2. Mild chronic microvascular ischemia. 3. Small focus of magnetic susceptibility effect in the posterior left parietal lobe is likely an incidental small cavernous angioma. Electronically Signed   By: Deatra Robinson M.D.   On: 06/24/2018 22:09    EKG: Independently reviewed.  Normal sinus rhythm.  Assessment/Plan Principal Problem:   Acute CVA (cerebrovascular accident) (HCC) Active Problems:   LV (left ventricular) mural thrombus without MI   Hypertension   Chronic back pain   Bipolar 1 disorder (HCC)   Ischemic cardiomyopathy    1. Acute CVA -discussed with Dr. Amada Jupiter on-call neurologist.  At this time since patient has acute CVA Dr. Amada Jupiter advised to hold off anticoagulation until further evaluation by  neurology team.  Patient on aspirin and Lipitor.  Physical therapy speech therapy consult requested.  Check hemoglobin A1c lipid panel.  Check 2D echo MRI brain and carotid Doppler. 2. History of CAD denies any chest pain.  On aspirin Lipitor and Coreg. 3. History of LV thrombus INR is subtherapeutic.  Neurologist advised to hold off anticoagulation for now.  Patient updated about the plan. 4. Ischemic cardiomyopathy appears compensated.  Medication list show patient is on Lasix and spironolactone which will be on hold due to acute stroke.  On digoxin digoxin levels to be checked. 5. History of bipolar disorder -medication list shows patient is on Abilify but patient is not sure he is taking this needs to be confirmed. 6. Chronic back pain on tramadol.  Will hold off any NSAIDs due to stroke.  Patient medication list needs to be verified.   DVT prophylaxis: Lovenox. Code Status: Full code. Family Communication: Discussed with patient. Disposition Plan: Home. Consults called: Neurology. Admission status: Observation.   Eduard Clos MD [facility] Pager [phone].  If 7PM-7AM, please contact night-coverage [email or web address] Password [facility]  06/25/2018, 2:49 AM

## 2018-06-25 NOTE — Progress Notes (Addendum)
STROKE TEAM PROGRESS NOTE  HPI:( Dr Laurence Slate)  Ricardo Brooks is a 61 y.o. male hypertension.  Patient tells me that he has been having a migraine headache since Sunday at 1230 in the day.  Came on suddenly, located over the left orbital aspect of his eye and throbbing.  Has not been relieved since then.  Although he has not tried any medications to relieve it.  He states he is never had any type of neurological complications with his headaches in the past.  He is having difficulty expressing himself and telling a good history however I could patch together much of his history.  He states that he has been having migraine headaches since he was in his 30s but cannot tell me how often he gets them or if he is been on any prophylactic medication.  He has received a migraine cocktail since he has been here and states it has been brought down from an 8/10 down to a 4/10.  He does have a ptosis of his left eye but states that this is normal.  Admits that he is photophobic and phonophobic during these events. During the consultation he is bradyphrenic clearly having difficulty expressing self but does understand everything I am asking him to do.   LKW: 06/23/2018 at 1230 tpa given?: no, out of the window Premorbid modified Rankin scale (mRS): 1 NIH stroke scale of 1   INTERVAL HISTORY No family is at the bedside.  Pt states he was prescribed warfarin for left ventricular clot since March 2019 but he was not taking every day. He was also not taking "shots" - those only lasted a few days. States he missed his last few coumadin clinic appts. admits to not being very compliant.  Vitals:   06/25/18 0600 06/25/18 0700 06/25/18 1000 06/25/18 1005  BP: (!) 135/98 (!) 136/95  126/85  Pulse: 72 70  66  Resp: (!) 31 20    Temp:    98.2 F (36.8 C)  TempSrc:    Oral  SpO2: 95% 97%    Weight:   59.4 kg (130 lb 15.3 oz)   Height:   5\' 9"  (1.753 m)     CBC:  Recent Labs  Lab 06/24/18 0900 06/25/18 0418  WBC  4.9 4.9  NEUTROABS 2.2  --   HGB 13.8 12.9*  HCT 44.5 40.6  MCV 96.7 96.7  PLT 193 168    Basic Metabolic Panel:  Recent Labs  Lab 06/24/18 0900 06/25/18 0418  NA 141 140  K 4.3 4.5  CL 107 106  CO2 25 25  GLUCOSE 105* 93  BUN 9 11  CREATININE 1.03 0.98  CALCIUM 9.4 8.9  MG 1.7  --    Lipid Panel:     Component Value Date/Time   CHOL 183 06/25/2018 0418   TRIG 61 06/25/2018 0418   HDL 39 (L) 06/25/2018 0418   CHOLHDL 4.7 06/25/2018 0418   VLDL 12 06/25/2018 0418   LDLCALC 132 (H) 06/25/2018 0418   HgbA1c:  Lab Results  Component Value Date   HGBA1C 5.6 06/25/2018   Urine Drug Screen:     Component Value Date/Time   LABOPIA NONE DETECTED 06/24/2018 0914   COCAINSCRNUR NONE DETECTED 06/24/2018 0914   LABBENZ NONE DETECTED 06/24/2018 0914   AMPHETMU NONE DETECTED 06/24/2018 0914   THCU NONE DETECTED 06/24/2018 0914   LABBARB NONE DETECTED 06/24/2018 0914    Alcohol Level No results found for: ETH  IMAGING Ct Head Wo Contrast  Result Date: 06/24/2018 CLINICAL DATA:  Altered level of consciousness. Difficulty speaking. EXAM: CT HEAD WITHOUT CONTRAST TECHNIQUE: Contiguous axial images were obtained from the base of the skull through the vertex without intravenous contrast. COMPARISON:  None. FINDINGS: Brain: Mild generalized volume loss with commensurate dilatation of the ventricles and sulci. No hydrocephalus. Mild chronic small vessel ischemic changes within the bilateral periventricular white matter. No mass, hemorrhage, edema or other evidence of acute parenchymal abnormality. No extra-axial hemorrhage. Vascular: No hyperdense vessel or unexpected calcification. Skull: Normal. Negative for fracture or focal lesion. Sinuses/Orbits: Chronic appearing depression deformity of the medial LEFT orbital wall. No acute findings. Visualized upper paranasal sinuses are clear. Other: None. IMPRESSION: 1. No acute findings.  No intracranial mass, hemorrhage or edema. 2. Mild  chronic small vessel ischemic changes within the white matter. Electronically Signed   By: Bary RichardStan  Maynard M.D.   On: 06/24/2018 09:46   Mr Brain Wo Contrast  Result Date: 06/24/2018 CLINICAL DATA:  Focal neurologic deficit.  Aphasia.  Headache. EXAM: MRI HEAD WITHOUT CONTRAST TECHNIQUE: Multiplanar, multiecho pulse sequences of the brain and surrounding structures were obtained without intravenous contrast. COMPARISON:  Head CT 06/24/2018 FINDINGS: BRAIN: Medium-sized area abnormal diffusion restriction in the left parietal lobe with a small, 5 mm focus also in the right frontal lobe. The midline structures are normal. There is hyperintense T2-weighted signal corresponding to the areas of diffusion restriction. Multifocal white matter hyperintensity, most commonly due to chronic ischemic microangiopathy. The CSF spaces are normal for age, with no hydrocephalus. 5 mm focus of magnetic susceptibility effect in the posterior left parietal lobe with associated hyperintensity on T1-weighted imaging is likely a small cavernous angioma. No acute hemorrhage. VASCULAR: Major intracranial arterial and venous sinus flow voids are preserved. SKULL AND UPPER CERVICAL SPINE: The visualized skull base, calvarium, upper cervical spine and extracranial soft tissues are normal. SINUSES/ORBITS: No fluid levels or advanced mucosal thickening. No mastoid or middle ear effusion. The orbits are normal. IMPRESSION: 1. Multifocal acute ischemia with left parietal lobe infarct measuring up to 17 mm and small right frontal infarct measuring up to 5 mm. No hemorrhage or mass effect. 2. Mild chronic microvascular ischemia. 3. Small focus of magnetic susceptibility effect in the posterior left parietal lobe is likely an incidental small cavernous angioma. Electronically Signed   By: Deatra RobinsonKevin  Herman M.D.   On: 06/24/2018 22:09   Mr Maxine GlennMra Head Wo Contrast  Result Date: 06/25/2018 CLINICAL DATA:  62 year old male with headache and aphasia found  to have small acute infarcts in the posterior left temporal and parietal lobe on MRI yesterday. Superimposed small 5 millimeter contralateral right frontal lobe white matter infarct also. EXAM: MRA HEAD WITHOUT CONTRAST TECHNIQUE: Angiographic images of the Circle of Willis were obtained using MRA technique without intravenous contrast. COMPARISON:  Brain MRI 06/24/2018. FINDINGS: Antegrade flow in the posterior circulation. Mildly dominant distal left vertebral artery. Normal PICA origins and no distal vertebral stenosis. Patent basilar artery with mild tortuosity but no stenosis. Normal SCA and PCA origins. Mildly tortuous P1 segments. Posterior communicating arteries are diminutive or absent. Bilateral PCA branches are within normal limits. Antegrade flow in both ICA siphons. The left siphon is mildly dominant. Mild bilateral ICA tortuosity with no siphon stenosis. Normal right ophthalmic artery origin. The left origins not clearly identified. Patent carotid termini with normal MCA and ACA origins. The left A1 is dominant. The anterior communicating artery and proximal A2 segments are mildly tortuous. There is a prominent median artery of  the corpus callosum, normal variant. Both MCA M1 segments are tortuous without stenosis. Bilateral MCA bi/tri-furcations are patent without stenosis. The visible MCA branches are within normal limits. There is faint MRA signal in the left posterior parietal lobe focus of hemosiderin described yesterday (series 3, image 124). There is similar gyriform signal in nearby left parietal lobe cortex which appears related to chronic encephalomalacia/laminar necrosis. IMPRESSION: 1. Mildly tortuous intracranial arteries with no stenosis or other circle of Willis abnormality. 2. Chronic posterior left parietal lobe cortical encephalomalacia superimposed on the acute ischemia. 3. Small left parietal lobe chronic microhemorrhage or cavernous vascular malformation (a slow flow vascular  malformation) re-demonstrated. Electronically Signed   By: Odessa Fleming M.D.   On: 06/25/2018 09:25    PHYSICAL EXAM Frail middle-aged African-American male currently not in distress. . Afebrile. Head is nontraumatic. Neck is supple without bruit.    Cardiac exam no murmur or gallop. Lungs are clear to auscultation. Distal pulses are well felt. Neurological Exam ;  Awake  Alert oriented x 3.  Mild expressive language difficulties with disfluency.  No paraphasias.  Good comprehension and slightly impaired repetition.[place] movements full without nystagmus.fundi were not visualized. Vision acuity and fields appear normal. Hearing is normal. Palatal movements are normal. Face symmetric. Tongue midline. Normal strength, tone, reflexes and coordination. Normal sensation. Gait deferred.  ASSESSMENT/PLAN Mr. Ricardo Brooks is a 62 y.o. male with history of LV clot dx in March 2019 on warfarin, ischemic cardiomyopathy, CAD, tobacco abuse, HTN, migraine, bipolar disorder, chronic back pain, PTSD presenting with headache and expressive aphasia.   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  Small vessel disease.  MRI multifocal left parietal lobe infarcts and small right frontal infarct.  Small vessel disease.  Left posterior parietal small cavernous angioma.  MRA mildly tortuous intracranial arteries.  Chronic posterior left parietal lobe cortical encephalomalacia.  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  Carotid Doppler  B ICA 1-39% stenosis, VAs antegrade   2D Echo  pending   LDL 132  HgbA1c 5.6  Lovenox 40 mg subcu daily for VTE prophylaxis  aspirin 81 mg daily and warfarin daily prior to admission no INR only 1.13 and patient admits to not being compliant, now on aspirin 325 mg daily.  Given LV clot, will start on IV heparin as bridge to Coumadin.  May  consider Lovenox bridge if needed at discharge  Therapy recommendations: No PT, no SLP  Disposition: Pending  LV thrombus   Diagnosed in March 2019  aspirin 81 mg daily and warfarin daily prior to admission no INR only 1.13 and patient admits to not being compliant, now on aspirin 325 mg daily.    start IV heparin as bridge to Coumadin.    May consider Lovenox bridge if needed at discharge  Hypertension  Stable . Permissive hypertension (OK if < 220/120) but gradually normalize in 5-7 days . Long-term BP goal normotensive  Hyperlipidemia  Home meds: Lipitor 80, resumed in hospital  LDL 132, goal < 70  Continue statin at discharge  Diabetes type II  HgbA1c 5.6, at goal < 7.0  Other Stroke Risk Factors  Cigarette smoker, advised to stop smoking  ETOH use, advised to drink no more than 2 drink(s) a day  Coronary artery disease  Hx Migraines -suspected migraine treated with Compazine and Benadryl in the ED.  Ischemic cardiomyopathy, appears compensated.  Other  Active Problems  Bipolar disorder  PTSD  Chronic back pain on tramadol  Hospital day # 0  Annie Main, MSN, APRN, ANVP-BC, AGPCNP-BC Advanced Practice Stroke Nurse [facility] See Amion for Schedule & Pager information 06/25/2018 11:02 AM  I have personally examined this patient, reviewed notes, independently viewed imaging studies, participated in medical decision making and plan of care.ROS completed by me personally and pertinent positives fully documented  I have made any additions or clarifications directly to the above note. Agree with note above.  Patient was counseled to be compliant with his anticoagulation regimen.  Check echocardiogram.  Start IV heparin drip and warfarin.  Cardiology to follow him closely for left ventricular clot and anticoagulation.  Discussed with Dr.Adhikari.Greater than 50% time during this 35-minute visit was spent on counseling and coordination of care  about his stroke and left ventricular clot and answering questions Delia Heady, MD Medical Director [facility] Pager: [phone] 06/25/2018 5:13 PM To contact Stroke Continuity provider, please refer to [email or web address]. After hours, contact General Neurology

## 2018-06-25 NOTE — Evaluation (Signed)
Physical Therapy Evaluation/ Discharge Patient Details Name: Ricardo Brooks MRN: [id number] DOB: 09/27/1956 Today's Date: 06/25/2018   History of Present Illness  62 yo admitted with difficulty speaking for 2 days with left parietal CVA. pMhx: ICM, CAD, HTn, back pain, bipolar, pTSD  Clinical Impression  Pt very pleasant and eager to move and return home. Pt with 5/5 strength bil UE and bil LE with good sensation, no noted deficits with visual tracking, balance, transfers or gait. Pt educated for BE FAST as well as need to have assist for mobility due to lines only. Pt states he is moving at baseline functional level and agreeable to no further therapy needs. Will sign off and recommend ambulation daily acutely.     Follow Up Recommendations No PT follow up    Equipment Recommendations  None recommended by PT    Recommendations for Other Services       Precautions / Restrictions Precautions Precautions: None      Mobility  Bed Mobility Overal bed mobility: Independent                Transfers Overall transfer level: Independent                  Ambulation/Gait Ambulation/Gait assistance: Modified independent (Device/Increase time) Gait Distance (Feet): 500 Feet Assistive device: None Gait Pattern/deviations: Step-through pattern;Decreased stance time - right   Gait velocity interpretation: >4.37 ft/sec, indicative of normal walking speed General Gait Details: pt reports right hip pain long standing and awaiting THA. pt with antalgic but steady gait including vertical and horizontal head turns and change of speed and direction  Stairs Stairs: Yes Stairs assistance: Modified independent (Device/Increase time) Stair Management: One rail Left;Alternating pattern;Forwards Number of Stairs: 15 General stair comments: Pt able to complete stairs safely with use of rail   Wheelchair Mobility    Modified Rankin (Stroke Patients Only) Modified Rankin (Stroke  Patients Only) Pre-Morbid Rankin Score: No symptoms Modified Rankin: No significant disability     Balance                                 Standardized Balance Assessment Standardized Balance Assessment : Dynamic Gait Index   Dynamic Gait Index Level Surface: Normal Change in Gait Speed: Normal Gait with Horizontal Head Turns: Normal Gait with Vertical Head Turns: Normal Gait and Pivot Turn: Normal Step Over Obstacle: Normal Step Around Obstacles: Normal Steps: Normal Total Score: 24       Pertinent Vitals/Pain Pain Assessment: No/denies pain    Home Living Family/patient expects to be discharged to:: Private residence Living Arrangements: Alone Available Help at Discharge: Friend(s);Available PRN/intermittently Type of Home: Apartment Home Access: Stairs to enter   Entrance Stairs-Number of Steps: 14 Home Layout: One level Home Equipment: None      Prior Function Level of Independence: Independent               Hand Dominance   Dominant Hand: Right    Extremity/Trunk Assessment   Upper Extremity Assessment Upper Extremity Assessment: Overall WFL for tasks assessed    Lower Extremity Assessment Lower Extremity Assessment: Overall WFL for tasks assessed    Cervical / Trunk Assessment Cervical / Trunk Assessment: Normal  Communication   Communication: Expressive difficulties(pt at times with increased effort and difficulty getting words out but clearly expressing himself)  Cognition Arousal/Alertness: Awake/alert Behavior During Therapy: WFL for tasks assessed/performed Overall Cognitive Status: Within Functional Limits  for tasks assessed                                        General Comments      Exercises     Assessment/Plan    PT Assessment Patent does not need any further PT services  PT Problem List         PT Treatment Interventions      PT Goals (Current goals can be found in the Care Plan  section)  Acute Rehab PT Goals PT Goal Formulation: All assessment and education complete, DC therapy    Frequency     Barriers to discharge        Co-evaluation               AM-PAC PT "6 Clicks" Daily Activity  Outcome Measure Difficulty turning over in bed (including adjusting bedclothes, sheets and blankets)?: None Difficulty moving from lying on back to sitting on the side of the bed? : None Difficulty sitting down on and standing up from a chair with arms (e.g., wheelchair, bedside commode, etc,.)?: None Help needed moving to and from a bed to chair (including a wheelchair)?: None Help needed walking in hospital room?: None Help needed climbing 3-5 steps with a railing? : None 6 Click Score: 24    End of Session Equipment Utilized During Treatment: Gait belt Activity Tolerance: Patient tolerated treatment well Patient left: in chair;with call bell/phone within reach Nurse Communication: Mobility status PT Visit Diagnosis: Other abnormalities of gait and mobility (R26.89)    Time: [id number] PT Time Calculation (min) (ACUTE ONLY): 19 min   Charges:   PT Evaluation $PT Eval Low Complexity: 1 Low     PT G Codes:        Delaney MeigsMaija Tabor Alasdair Kleve, PT [phone]   Blaze Sandin B Brekyn Huntoon 06/25/2018, 1:53 PM

## 2018-06-25 NOTE — Progress Notes (Signed)
Patient arrived to unit, AOx3

## 2018-06-25 NOTE — Progress Notes (Signed)
ANTICOAGULATION CONSULT NOTE - Initial Consult  Pharmacy Consult for warfarin and heparin dosing Indication: LV clot   Allergies  Allergen Reactions  . Methocarbamol Nausea And Vomiting and Anxiety    Patient Measurements: Height: 5\' 9"  (175.3 cm) Weight: 130 lb 15.3 oz (59.4 kg) IBW/kg (Calculated) : 70.7 Heparin Dosing Weight: 59.4  Vital Signs: Temp: 98.2 F (36.8 C) (07/23 1005) Temp Source: Oral (07/23 1005) BP: 126/85 (07/23 1005) Pulse Rate: 66 (07/23 1005)  Labs: Recent Labs    06/24/18 0900 06/24/18 1910 06/25/18 0418  HGB 13.8  --  12.9*  HCT 44.5  --  40.6  PLT 193  --  168  LABPROT  --  14.4  --   INR  --  1.13  --   CREATININE 1.03  --  0.98    Estimated Creatinine Clearance: 68.2 mL/min (by C-G formula based on SCr of 0.98 mg/dL).   Medical History: Past Medical History:  Diagnosis Date  . Asthma   . Bipolar 1 disorder (HCC)   . Chronic back pain   . Hip pain, chronic   . Hypertension   . PTSD (post-traumatic stress disorder)      Assessment: 62 yo M presents to ER with difficulty speaking and found to have an acute stroke. PMH includes cardiomyopathy, CAD, HTN, chronic back pain, bipolar disorder, tobacco abuse, LV thrombus. Patient on warfarin 7.5 mg MF and 5 mg all other days per last clinic note. Pharmacy consulted for warfarin and heparin dosing for LV thrombus.   INR on admit 1.13, Hgb 12.9, no s/s of bleeding noted    Goal of Therapy:  INR goal 2-3  Heparin level goal 0.3-0.5 Monitor platelets by anticoagulation protocol: Yes   Plan:  No heparin bolus due to acute stroke Start heparin drip at 750 units/hour Check heparin level 6 hours post dose  Start warfarin 7.5mg  x 1 dose  Continue to monitor H/H, CBC, daily INR, daily heparin level and bleeding   Gwynneth AlbrightSara Nimer, Ilda BassetPharm D PGY1 Pharmacy Resident  Phone [phone] 06/25/2018   11:32 AM

## 2018-06-25 NOTE — Progress Notes (Signed)
ANTICOAGULATION CONSULT NOTE - Initial Consult  Pharmacy Consult for warfarin and heparin dosing Indication: LV clot   Allergies  Allergen Reactions  . Methocarbamol Nausea And Vomiting and Anxiety    Patient Measurements: Height: 5\' 9"  (175.3 cm) Weight: 130 lb 15.3 oz (59.4 kg) IBW/kg (Calculated) : 70.7 Heparin Dosing Weight: 59.4  Vital Signs: Temp: 97.6 F (36.4 C) (07/23 1610) Temp Source: Oral (07/23 1610) BP: 123/81 (07/23 1755) Pulse Rate: 67 (07/23 1755)  Labs: Recent Labs    06/24/18 0900 06/24/18 1910 06/25/18 0418 06/25/18 1938  HGB 13.8  --  12.9*  --   HCT 44.5  --  40.6  --   PLT 193  --  168  --   LABPROT  --  14.4  --   --   INR  --  1.13  --   --   HEPARINUNFRC  --   --   --  <0.10*  CREATININE 1.03  --  0.98  --     Estimated Creatinine Clearance: 68.2 mL/min (by C-G formula based on SCr of 0.98 mg/dL).   Medical History: Past Medical History:  Diagnosis Date  . Arthritis    "right hip" (06/25/2018)  . Bipolar 1 disorder (HCC)   . CHF (congestive heart failure) (HCC)   . Childhood asthma   . Chronic lower back pain   . Headache    "monthly" (06/25/2018)  . High cholesterol   . Hip pain, chronic   . Hypertension   . Pneumonia 1990s X 1  . PTSD (post-traumatic stress disorder)   . Stroke [facility](HCC) 06/24/2018   left parietal CVA/notes 06/25/2018 "speech comes and goes" (06/25/2018)    Assessment: 62 yo M presents to ER with difficulty speaking and found to have an acute stroke. PMH includes cardiomyopathy, CAD, HTN, chronic back pain, bipolar disorder, tobacco abuse, LV thrombus. Patient on warfarin 7.5 mg MF and 5 mg all other days per last clinic note. Pharmacy consulted for warfarin and heparin dosing for LV thrombus.   Heparin level undetectable on 750 units/hr, no interruption with infusion per RN   Goal of Therapy:  INR goal 2-3  Heparin level goal 0.3-0.5 Monitor platelets by anticoagulation protocol: Yes   Plan:  No heparin  bolus due to acute stroke Increase heparin rate to 950 units/hour F/u AM heparin level and PT/ INR Start warfarin 7.5mg  x 1 dose  Continue to monitor H/H, CBC, daily INR, daily heparin level and bleeding   Bayard HuggerMei Torry Adamczak, PharmD, BCPS, BCPPS Clinical Pharmacist  Pager: [phone]   06/25/2018   8:43 PM

## 2018-06-25 NOTE — Progress Notes (Signed)
Preliminary notes--Bilateral carotid duplex exam completed. 1-39% on left ICA. Antegrade flow bilateral vertebral arteries.  Hongying Nevada Kirchner (RDMS RVT) 06/25/18 11:50 AM

## 2018-06-25 NOTE — ED Notes (Signed)
Admitting MD at bedside.

## 2018-06-26 ENCOUNTER — Inpatient Hospital Stay (HOSPITAL_COMMUNITY): Payer: Medicaid Other

## 2018-06-26 DIAGNOSIS — I513 Intracardiac thrombosis, not elsewhere classified: Secondary | ICD-10-CM

## 2018-06-26 DIAGNOSIS — I340 Nonrheumatic mitral (valve) insufficiency: Secondary | ICD-10-CM

## 2018-06-26 DIAGNOSIS — I361 Nonrheumatic tricuspid (valve) insufficiency: Secondary | ICD-10-CM

## 2018-06-26 DIAGNOSIS — I10 Essential (primary) hypertension: Secondary | ICD-10-CM

## 2018-06-26 DIAGNOSIS — I639 Cerebral infarction, unspecified: Secondary | ICD-10-CM

## 2018-06-26 DIAGNOSIS — I255 Ischemic cardiomyopathy: Secondary | ICD-10-CM

## 2018-06-26 LAB — HEPARIN LEVEL (UNFRACTIONATED)
HEPARIN UNFRACTIONATED: 0.45 [IU]/mL (ref 0.30–0.70)
Heparin Unfractionated: 0.31 IU/mL (ref 0.30–0.70)

## 2018-06-26 LAB — CBC
HCT: 38.2 % — ABNORMAL LOW (ref 39.0–52.0)
Hemoglobin: 12.1 g/dL — ABNORMAL LOW (ref 13.0–17.0)
MCH: 30 pg (ref 26.0–34.0)
MCHC: 31.7 g/dL (ref 30.0–36.0)
MCV: 94.6 fL (ref 78.0–100.0)
PLATELETS: 161 10*3/uL (ref 150–400)
RBC: 4.04 MIL/uL — AB (ref 4.22–5.81)
RDW: 14.1 % (ref 11.5–15.5)
WBC: 4.4 10*3/uL (ref 4.0–10.5)

## 2018-06-26 LAB — ECHOCARDIOGRAM COMPLETE
HEIGHTINCHES: 69 in
WEIGHTICAEL: 2095.25 [oz_av]

## 2018-06-26 LAB — PROTIME-INR
INR: 1.19
PROTHROMBIN TIME: 15 s (ref 11.4–15.2)

## 2018-06-26 MED ORDER — WARFARIN SODIUM 7.5 MG PO TABS
7.5000 mg | ORAL_TABLET | Freq: Once | ORAL | Status: AC
  Administered 2018-06-26: 7.5 mg via ORAL
  Filled 2018-06-26: qty 1

## 2018-06-26 NOTE — Progress Notes (Signed)
STROKE TEAM PROGRESS NOTE     INTERVAL HISTORY No family is at the bedside.   States  he is doing better. He agrees to being very compliant with his anticoagulation regimen  Vitals:   06/25/18 2327 06/26/18 0423 06/26/18 0838 06/26/18 1153  BP: 125/81 113/74 109/71 106/69  Pulse: (!) 59 (!) 58 (!) 52 63  Resp: 19 18 18 18   Temp: 98 F (36.7 C) 98.3 F (36.8 C) 97.7 F (36.5 C) 97.8 F (36.6 C)  TempSrc: Oral Oral Oral Oral  SpO2: 98% 100% 100% 100%  Weight:      Height:        CBC:  Recent Labs  Lab 06/24/18 0900 06/25/18 0418 06/26/18 0430  WBC 4.9 4.9 4.4  NEUTROABS 2.2  --   --   HGB 13.8 12.9* 12.1*  HCT 44.5 40.6 38.2*  MCV 96.7 96.7 94.6  PLT 193 168 161    Basic Metabolic Panel:  Recent Labs  Lab 06/24/18 0900 06/25/18 0418  NA 141 140  K 4.3 4.5  CL 107 106  CO2 25 25  GLUCOSE 105* 93  BUN 9 11  CREATININE 1.03 0.98  CALCIUM 9.4 8.9  MG 1.7  --    Lipid Panel:     Component Value Date/Time   CHOL 183 06/25/2018 0418   TRIG 61 06/25/2018 0418   HDL 39 (L) 06/25/2018 0418   CHOLHDL 4.7 06/25/2018 0418   VLDL 12 06/25/2018 0418   LDLCALC 132 (H) 06/25/2018 0418   HgbA1c:  Lab Results  Component Value Date   HGBA1C 5.6 06/25/2018   Urine Drug Screen:     Component Value Date/Time   LABOPIA NONE DETECTED 06/24/2018 0914   COCAINSCRNUR NONE DETECTED 06/24/2018 0914   LABBENZ NONE DETECTED 06/24/2018 0914   AMPHETMU NONE DETECTED 06/24/2018 0914   THCU NONE DETECTED 06/24/2018 0914   LABBARB NONE DETECTED 06/24/2018 0914    Alcohol Level No results found for: [facility]  IMAGING Ricardo Brain Wo Contrast  Result Date: 06/24/2018 CLINICAL DATA:  Focal neurologic deficit.  Aphasia.  Headache. EXAM: MRI HEAD WITHOUT CONTRAST TECHNIQUE: Multiplanar, multiecho pulse sequences of the brain and surrounding structures were obtained without intravenous contrast. COMPARISON:  Head CT 06/24/2018 FINDINGS: BRAIN: Medium-sized area abnormal diffusion  restriction in the left parietal lobe with a small, 5 mm focus also in the right frontal lobe. The midline structures are normal. There is hyperintense T2-weighted signal corresponding to the areas of diffusion restriction. Multifocal white matter hyperintensity, most commonly due to chronic ischemic microangiopathy. The CSF spaces are normal for age, with no hydrocephalus. 5 mm focus of magnetic susceptibility effect in the posterior left parietal lobe with associated hyperintensity on T1-weighted imaging is likely a small cavernous angioma. No acute hemorrhage. VASCULAR: Major intracranial arterial and venous sinus flow voids are preserved. SKULL AND UPPER CERVICAL SPINE: The visualized skull base, calvarium, upper cervical spine and extracranial soft tissues are normal. SINUSES/ORBITS: No fluid levels or advanced mucosal thickening. No mastoid or middle ear effusion. The orbits are normal. IMPRESSION: 1. Multifocal acute ischemia with left parietal lobe infarct measuring up to 17 mm and small right frontal infarct measuring up to 5 mm. No hemorrhage or mass effect. 2. Mild chronic microvascular ischemia. 3. Small focus of magnetic susceptibility effect in the posterior left parietal lobe is likely an incidental small cavernous angioma. Electronically Signed   By: Deatra RobinsonKevin  Herman M.D.   On: 06/24/2018 22:09   Ricardo Maxine GlennMra Head Wo Contrast  Result Date: 06/25/2018 CLINICAL DATA:  62 year old male with headache and aphasia found to have small acute infarcts in the posterior left temporal and parietal lobe on MRI yesterday. Superimposed small 5 millimeter contralateral right frontal lobe white matter infarct also. EXAM: MRA HEAD WITHOUT CONTRAST TECHNIQUE: Angiographic images of the Circle of Willis were obtained using MRA technique without intravenous contrast. COMPARISON:  Brain MRI 06/24/2018. FINDINGS: Antegrade flow in the posterior circulation. Mildly dominant distal left vertebral artery. Normal PICA origins and  no distal vertebral stenosis. Patent basilar artery with mild tortuosity but no stenosis. Normal SCA and PCA origins. Mildly tortuous P1 segments. Posterior communicating arteries are diminutive or absent. Bilateral PCA branches are within normal limits. Antegrade flow in both ICA siphons. The left siphon is mildly dominant. Mild bilateral ICA tortuosity with no siphon stenosis. Normal right ophthalmic artery origin. The left origins not clearly identified. Patent carotid termini with normal MCA and ACA origins. The left A1 is dominant. The anterior communicating artery and proximal A2 segments are mildly tortuous. There is a prominent median artery of the corpus callosum, normal variant. Both MCA M1 segments are tortuous without stenosis. Bilateral MCA bi/tri-furcations are patent without stenosis. The visible MCA branches are within normal limits. There is faint MRA signal in the left posterior parietal lobe focus of hemosiderin described yesterday (series 3, image 124). There is similar gyriform signal in nearby left parietal lobe cortex which appears related to chronic encephalomalacia/laminar necrosis. IMPRESSION: 1. Mildly tortuous intracranial arteries with no stenosis or other circle of Willis abnormality. 2. Chronic posterior left parietal lobe cortical encephalomalacia superimposed on the acute ischemia. 3. 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 Electronically Signed   By: Odessa Fleming M.D.   On: 06/25/2018 09:25    PHYSICAL EXAM Frail middle-aged African-American male currently not in distress. . Afebrile. Head is nontraumatic. Neck is supple without bruit.    Cardiac exam no murmur or gallop. Lungs are clear to auscultation. Distal pulses are well felt. Neurological Exam ;  Awake  Alert oriented x 3.  Mild expressive language difficulties with disfluency.  No paraphasias.  Good comprehension and slightly  impaired repetition.[place] movements full without nystagmus.fundi were not visualized. Vision acuity and fields appear normal. Hearing is normal. Palatal movements are normal. Face symmetric. Tongue midline. Normal strength, tone, reflexes and coordination. Normal sensation. Gait deferred.  ASSESSMENT/PLAN Ricardo. Ioan Brooks is a 62 y.o. male with history of LV clot dx in March 2019 on warfarin, ischemic cardiomyopathy, CAD, tobacco abuse, HTN, migraine, bipolar disorder, chronic back pain, PTSD presenting with headache and expressive aphasia.   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  Small vessel disease.  MRI multifocal left parietal lobe infarcts and small right frontal infarct.  Small vessel disease.  Left posterior parietal small cavernous angioma.  MRA mildly tortuous intracranial arteries.  Chronic posterior left parietal lobe cortical encephalomalacia.  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  Carotid Doppler  B ICA 1-39% stenosis, VAs antegrade   2D Echo  pending   LDL 132  HgbA1c 5.6  Lovenox 40 mg subcu daily for VTE prophylaxis  aspirin 81 mg daily and warfarin daily prior to admission no INR only 1.13 and patient admits to not being compliant, now on aspirin 325 mg daily.  Given LV clot, will start on IV heparin as  bridge to Coumadin.  May consider Lovenox bridge if needed at discharge  Therapy recommendations: No PT, no SLP  Disposition: Pending  LV thrombus   Diagnosed in March 2019  aspirin 81 mg daily and warfarin daily prior to admission no INR only 1.13 and patient admits to not being compliant, now on aspirin 325 mg daily.    start IV heparin as bridge to Coumadin.    May consider Lovenox bridge if needed at discharge  Hypertension  Stable . Permissive hypertension (OK if < 220/120) but gradually normalize  in 5-7 days . Long-term BP goal normotensive  Hyperlipidemia  Home meds: Lipitor 80, resumed in hospital  LDL 132, goal < 70  Continue statin at discharge  Diabetes type II  HgbA1c 5.6, at goal < 7.0  Other Stroke Risk Factors  Cigarette smoker, advised to stop smoking  ETOH use, advised to drink no more than 2 drink(s) a day  Coronary artery disease  Hx Migraines -suspected migraine treated with Compazine and Benadryl in the ED.  Ischemic cardiomyopathy, appears compensated.  Other Active Problems  Bipolar disorder  PTSD  Chronic back pain on tramadol  Hospital day # 1      Patient was counseled to be compliant with his anticoagulation regimen.  Check echocardiogram. Continue IV heparin drip and warfarin.  Cardiology to follow him closely for left ventricular clot and anticoagulation.  Discussed with Dr.Adhikari.Greater than 50% time during this 25-minute visit was spent on counseling and coordination of care about his stroke and left ventricular clot and answering questions Delia Heady, MD Medical Director [facility] Pager: [phone] 06/26/2018 2:07 PM To contact Stroke Continuity provider, please refer to [email or web address]. After hours, contact General Neurology

## 2018-06-26 NOTE — Progress Notes (Signed)
  Speech Language Pathology Treatment: Cognitive-Linquistic  Patient Details Name: Sydnee CabalDavid Laboy MRN: [id number] DOB: 20-Jul-1956 Today's Date: 06/26/2018 Time: [id number] SLP Time Calculation (min) (ACUTE ONLY): 15 min  Assessment / Plan / Recommendation Clinical Impression  Skilled treatment session focused on communication. SLP facilitated session by providing conversational topics. Pt with increased about to communicate without word finding deficits at the conversation level. Pt able to describe medical plan of care with fluent speech. ST to follow up briefly to ensure continued progress.    HPI HPI: Sydnee CabalDavid Breon is a 62 y.o. male with history of ischemic cardiomyopathy CAD, hypertension, chronic back pain, bipolar disorder, tobacco abuse, LV thrombus on Coumadin presents to the ER because of persistent difficulty talking for the last 2 days.  Denies any difficulty ambulating or any weakness of the extremities or any visual symptoms.  Denies any difficulty swallowing.  Since patient has been ongoing difficulty with bringing out words for last 2 days patient came to the ER.  In addition patient also has been having left temporal headache.      SLP Plan  Continue with current plan of care       Recommendations                   Oral Care Recommendations: Oral care BID Follow up Recommendations: None SLP Visit Diagnosis: Aphasia (R47.01) Plan: Continue with current plan of care       GO                Dugan Vanhoesen 06/26/2018, 1:04 PM

## 2018-06-26 NOTE — Progress Notes (Addendum)
ANTICOAGULATION CONSULT NOTE - Initial Consult  Pharmacy Consult for warfarin and heparin dosing Indication: LV clot   Allergies  Allergen Reactions  . Methocarbamol Nausea And Vomiting and Anxiety    Patient Measurements: Height: 5\' 9"  (175.3 cm) Weight: 130 lb 15.3 oz (59.4 kg) IBW/kg (Calculated) : 70.7 Heparin Dosing Weight: 59.4  Vital Signs: Temp: 97.8 F (36.6 C) (07/24 1153) Temp Source: Oral (07/24 1153) BP: 106/69 (07/24 1153) Pulse Rate: 63 (07/24 1153)  Labs: Recent Labs    06/24/18 0900 06/24/18 1910 06/25/18 0418 06/25/18 1938 06/26/18 0430 06/26/18 1045  HGB 13.8  --  12.9*  --  12.1*  --   HCT 44.5  --  40.6  --  38.2*  --   PLT 193  --  168  --  161  --   LABPROT  --  14.4  --   --  15.0  --   INR  --  1.13  --   --  1.19  --   HEPARINUNFRC  --   --   --  <0.10* 0.31 0.45  CREATININE 1.03  --  0.98  --   --   --     Estimated Creatinine Clearance: 68.2 mL/min (by C-G formula based on SCr of 0.98 mg/dL).   Medical History: Past Medical History:  Diagnosis Date  . Arthritis    "right hip" (06/25/2018)  . Bipolar 1 disorder (HCC)   . CHF (congestive heart failure) (HCC)   . Childhood asthma   . Chronic lower back pain   . Headache    "monthly" (06/25/2018)  . High cholesterol   . Hip pain, chronic   . Hypertension   . Pneumonia 1990s X 1  . PTSD (post-traumatic stress disorder)   . Stroke [facility](HCC) 06/24/2018   left parietal CVA/notes 06/25/2018 "speech comes and goes" (06/25/2018)     Assessment: 62 yo M presents to ER with difficulty speaking and found to have an acute stroke. PMH includes cardiomyopathy, CAD, HTN, chronic back pain, bipolar disorder, tobacco abuse, LV thrombus. Patient on warfarin 7.5 mg MF and 5 mg all other days per last clinic note. Pharmacy consulted for warfarin and heparin dosing for LV thrombus.   INR 1.19, Hgb 12.1, no s/s of bleeding noted Confirmatory Heparin level 0.45    Goal of Therapy:  INR goal 2-3   Heparin level goal 0.3-0.5 Monitor platelets by anticoagulation protocol: Yes   Plan:  Continue heparin drip at 950 units/hour Heparin level in AM Repeat warfarin 7.5mg  x 1 dose  Decrease ASA to 81mg  while on warfarin? Continue to monitor H/H, CBC, daily INR, daily heparin level and bleeding   Carlito Bogert A. Jeanella CrazePierce, PharmD, BCPS Clinical Pharmacist [place] Pager: [phone] Please utilize Amion for appropriate phone number to reach the unit pharmacist [facility])   06/26/2018   12:21 PM

## 2018-06-26 NOTE — Progress Notes (Addendum)
PROGRESS NOTE    Ricardo Brooks  NWG:[id number] DOB: 05/09/1956 DOA: 06/24/2018 PCP: Gilda Crease, MD   Brief Narrative: Patient is a 62 year old male with past medical history of ischemic cardiomyopathy, coronary disease, hypertension, left ventricular thrombus on coumadin, chronic back pain, bipolar disorder , who presents to the emergency department with complaints of dysarthria.  MRI of the brain done in the emergency department showed multifocal stroke. His INR was subtherapeutic.  Neurology is already following.  Neurology has started him on heparin drip.  Pending echocardiogram for the follow-up of left ventricle thrombus.Cardiology consulted today.   Assessment & Plan:   Principal Problem:   Acute CVA (cerebrovascular accident) (HCC) Active Problems:   LV (left ventricular) mural thrombus without MI   Hypertension   Chronic back pain   Bipolar 1 disorder (HCC)   Ischemic cardiomyopathy  Acute CVA: MRI showed multifocal acute ischemia with left parietal lobe infarct measuring up to 17 mm and small right frontal infarct measuring up to 5 mm. No hemorrhage or mass effect.  Currently on aspirin and Lipitor.  Patient evaluated by PT.No follow-up recommended.   This CVA could be associated with left ventricular thrombus.  Awaiting echocardiogram.  Carotid Doppler did not show any significant carotid artery stenosis.  Neurology following.  Left ventricular thrombus: Has a history of left ventricular thrombus.  Was on Coumadin but he was not taking for last few days.  INR was subtherapeutic on presentation.  Currently bridged on heparin and warfarin.  We will follow-up echocardiogram.  He was following with cardiology.  Cardiology has been requested for consult here.  History of coronary disease: Denies any chest pain.  On aspirin, Lipitor and Coreg at home which we will continue.  History of ischemic cardiomyopathy: We will follow-up echocardiogram.  On Lasix, digoxin and  spironolactone at home.  Currently his heart failure is compensated.Also on losartan.  History of bipolar disorder: Was on Abilify at home.  History of chronic back pain: Stable  DVT prophylaxis: Heparin and warfarin Code Status: Full Family Communication: None present at the bedside Disposition Plan: Home after echocardiogram report, final decision over anticoagulation, cardiology follow-up  Consultants: Neurology  Procedures: None  Antimicrobials: None  Subjective: Patient seen and examined the bedside this morning.  Remains comfortable.  No new issues/events.  No weakness.  Speech is clear  Objective: Vitals:   06/25/18 2327 06/26/18 0423 06/26/18 0838 06/26/18 1153  BP: 125/81 113/74 109/71 106/69  Pulse: (!) 59 (!) 58 (!) 52 63  Resp: 19 18 18 18   Temp: 98 F (36.7 C) 98.3 F (36.8 C) 97.7 F (36.5 C) 97.8 F (36.6 C)  TempSrc: Oral Oral Oral Oral  SpO2: 98% 100% 100% 100%  Weight:      Height:        Intake/Output Summary (Last 24 hours) at 06/26/2018 1322 Last data filed at 06/26/2018 1100 Gross per 24 hour  Intake 991.18 ml  Output -  Net 991.18 ml   Filed Weights   06/24/18 0851 06/25/18 1000  Weight: 61.2 kg (135 lb) 59.4 kg (130 lb 15.3 oz)    Examination:  General exam: Appears calm and comfortable ,Not in distress,average built HEENT:PERRL,Oral mucosa moist, Ear/Nose normal on gross exam Respiratory system: Bilateral equal air entry, normal vesicular breath sounds, no wheezes or crackles  Cardiovascular system: S1 & S2 heard, RRR. No JVD, murmurs, rubs, gallops or clicks. No pedal edema. Gastrointestinal system: Abdomen is nondistended, soft and nontender. No organomegaly or masses felt. Normal  bowel sounds heard. Central nervous system: Alert and oriented. No focal neurological deficits. Extremities: No edema, no clubbing ,no cyanosis, distal peripheral pulses palpable. Skin: No rashes, lesions or ulcers,no icterus ,no pallor MSK: Normal muscle  bulk,tone ,power Psychiatry: Judgement and insight appear normal. Mood & affect appropriate.     Data Reviewed: I have personally reviewed following labs and imaging studies  CBC: Recent Labs  Lab 06/24/18 0900 06/25/18 0418 06/26/18 0430  WBC 4.9 4.9 4.4  NEUTROABS 2.2  --   --   HGB 13.8 12.9* 12.1*  HCT 44.5 40.6 38.2*  MCV 96.7 96.7 94.6  PLT 193 168 161   Basic Metabolic Panel: Recent Labs  Lab 06/24/18 0900 06/25/18 0418  NA 141 140  K 4.3 4.5  CL 107 106  CO2 25 25  GLUCOSE 105* 93  BUN 9 11  CREATININE 1.03 0.98  CALCIUM 9.4 8.9  MG 1.7  --    GFR: Estimated Creatinine Clearance: 68.2 mL/min (by C-G formula based on SCr of 0.98 mg/dL). Liver Function Tests: Recent Labs  Lab 06/24/18 0900 06/25/18 0418  AST 19 17  ALT 11 9  ALKPHOS 60 56  BILITOT 1.0 0.9  PROT 6.9 6.0*  ALBUMIN 3.7 3.2*   No results for input(s): LIPASE, AMYLASE in the last 168 hours. No results for input(s): AMMONIA in the last 168 hours. Coagulation Profile: Recent Labs  Lab 06/24/18 1910 06/26/18 0430  INR 1.13 1.19   Cardiac Enzymes: No results for input(s): CKTOTAL, CKMB, CKMBINDEX, TROPONINI in the last 168 hours. BNP (last 3 results) No results for input(s): PROBNP in the last 8760 hours. HbA1C: Recent Labs    06/25/18 0418  HGBA1C 5.6   CBG: No results for input(s): GLUCAP in the last 168 hours. Lipid Profile: Recent Labs    06/25/18 0418  CHOL 183  HDL 39*  LDLCALC 132*  TRIG 61  CHOLHDL 4.7   Thyroid Function Tests: No results for input(s): TSH, T4TOTAL, FREET4, T3FREE, THYROIDAB in the last 72 hours. Anemia Panel: No results for input(s): VITAMINB12, FOLATE, FERRITIN, TIBC, IRON, RETICCTPCT in the last 72 hours. Sepsis Labs: No results for input(s): PROCALCITON, LATICACIDVEN in the last 168 hours.  No results found for this or any previous visit (from the past 240 hour(s)).       Radiology Studies: Mr Brain Wo Contrast  Result Date:  06/24/2018 CLINICAL DATA:  Focal neurologic deficit.  Aphasia.  Headache. EXAM: MRI HEAD WITHOUT CONTRAST TECHNIQUE: Multiplanar, multiecho pulse sequences of the brain and surrounding structures were obtained without intravenous contrast. COMPARISON:  Head CT 06/24/2018 FINDINGS: BRAIN: Medium-sized area abnormal diffusion restriction in the left parietal lobe with a small, 5 mm focus also in the right frontal lobe. The midline structures are normal. There is hyperintense T2-weighted signal corresponding to the areas of diffusion restriction. Multifocal white matter hyperintensity, most commonly due to chronic ischemic microangiopathy. The CSF spaces are normal for age, with no hydrocephalus. 5 mm focus of magnetic susceptibility effect in the posterior left parietal lobe with associated hyperintensity on T1-weighted imaging is likely a small cavernous angioma. No acute hemorrhage. VASCULAR: Major intracranial arterial and venous sinus flow voids are preserved. SKULL AND UPPER CERVICAL SPINE: The visualized skull base, calvarium, upper cervical spine and extracranial soft tissues are normal. SINUSES/ORBITS: No fluid levels or advanced mucosal thickening. No mastoid or middle ear effusion. The orbits are normal. IMPRESSION: 1. Multifocal acute ischemia with left parietal lobe infarct measuring up to 17 mm and  small right frontal infarct measuring up to 5 mm. No hemorrhage or mass effect. 2. Mild chronic microvascular ischemia. 3. Small focus of magnetic susceptibility effect in the posterior left parietal lobe is likely an incidental small cavernous angioma. Electronically Signed   By: Deatra Robinson M.D.   On: 06/24/2018 22:09   Mr Maxine Glenn Head Wo Contrast  Result Date: 06/25/2018 CLINICAL DATA:  62 year old male with headache and aphasia found to have small acute infarcts in the posterior left temporal and parietal lobe on MRI yesterday. Superimposed small 5 millimeter contralateral right frontal lobe white matter  infarct also. EXAM: MRA HEAD WITHOUT CONTRAST TECHNIQUE: Angiographic images of the Circle of Willis were obtained using MRA technique without intravenous contrast. COMPARISON:  Brain MRI 06/24/2018. FINDINGS: Antegrade flow in the posterior circulation. Mildly dominant distal left vertebral artery. Normal PICA origins and no distal vertebral stenosis. Patent basilar artery with mild tortuosity but no stenosis. Normal SCA and PCA origins. Mildly tortuous P1 segments. Posterior communicating arteries are diminutive or absent. Bilateral PCA branches are within normal limits. Antegrade flow in both ICA siphons. The left siphon is mildly dominant. Mild bilateral ICA tortuosity with no siphon stenosis. Normal right ophthalmic artery origin. The left origins not clearly identified. Patent carotid termini with normal MCA and ACA origins. The left A1 is dominant. The anterior communicating artery and proximal A2 segments are mildly tortuous. There is a prominent median artery of the corpus callosum, normal variant. Both MCA M1 segments are tortuous without stenosis. Bilateral MCA bi/tri-furcations are patent without stenosis. The visible MCA branches are within normal limits. There is faint MRA signal in the left posterior parietal lobe focus of hemosiderin described yesterday (series 3, image 124). There is similar gyriform signal in nearby left parietal lobe cortex which appears related to chronic encephalomalacia/laminar necrosis. IMPRESSION: 1. Mildly tortuous intracranial arteries with no stenosis or other circle of Willis abnormality. 2. Chronic posterior left parietal lobe cortical encephalomalacia superimposed on the acute ischemia. 3. 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 Electronically Signed   By: Odessa Fleming M.D.   On: 06/25/2018 09:25        Scheduled Meds: . aspirin  300 mg Rectal Daily   Or  . aspirin  325  mg Oral Daily  . atorvastatin  80 mg Oral q1800  . carvedilol  3.125 mg Oral BID WC  . digoxin  0.125 mg Oral Daily  . losartan  25 mg Oral Daily  . warfarin  7.5 mg Oral ONCE-1800  . Warfarin - Pharmacist Dosing Inpatient   Does not apply q1800   Continuous Infusions: . heparin 950 Units/hr (06/25/18 2102)     LOS: 1 day    Time spent: 35 mins.More than 50% of that time was spent in counseling and/or coordination of care.      Burnadette Pop, MD [facility] Pager [phone]  If 7PM-7AM, please contact night-coverage [email or web address] Password [email or web address] 06/26/2018, 1:22 PM

## 2018-06-26 NOTE — Progress Notes (Signed)
  Echocardiogram 2D Echocardiogram has been performed.  Ricardo Brooks, Ricardo Brooks 06/26/2018, 3:24 PM

## 2018-06-26 NOTE — Consult Note (Signed)
Cardiology Consultation:   Patient ID: Ricardo Brooks; [id number]; 01/10/1956   Admit date: 06/24/2018 Date of Consult: 06/26/2018  Primary Care Provider: Gilda Crease, MD Primary Cardiologist: Nanetta Batty, MD     Patient Profile:   Ricardo Brooks is a 62 y.o. male with a hx of severe LV dysfunction and LV mural thrombus who is being seen today for the evaluation of LV mural thrombus at the request of Dr Renford Dills.Marland Kitchen  History of Present Illness:   Ricardo Brooks is a 62 year old thin appearing single African-American male father of 3 adult children who we are asked to see for evaluation of LV mural thrombus in the setting of stroke with dysarthria which has resolved.  Other problems include a history of hypertension, hyperlipidemia and diabetes.  He was admitted on 02/12/2018 with heart failure and was found to have severe LV dysfunction.  A Myoview was high risk and a right left heart cath revealed an occluded ostial LAD otherwise no significant CAD.  A viability study showed scar in the LAD territory with LV layered mural thrombus.  He was discharged home on Lovenox bridge and Coumadin with an INR target of 2-3.  He was seen back in our office last on 02/25/2018 by Ricardo Brooks nurse practitioner but has not been seen back since.  He does admit to continued tobacco abuse as well as medication noncompliance.  He apparently did not take his Coumadin last week.  He was admitted with a aphasia which has since cleared with MRI that shows multiple strokes.  His admission INR was 1.13.  Past Medical History:  Diagnosis Date  . Arthritis    "right hip" (06/25/2018)  . Bipolar 1 disorder (HCC)   . CHF (congestive heart failure) (HCC)   . Childhood asthma   . Chronic lower back pain   . Headache    "monthly" (06/25/2018)  . High cholesterol   . Hip pain, chronic   . Hypertension   . Pneumonia 1990s X 1  . PTSD (post-traumatic stress disorder)   . Stroke [facility]) 06/24/2018   left parietal  CVA/notes 06/25/2018 "speech comes and goes" (06/25/2018)    Past Surgical History:  Procedure Laterality Date  . FRACTURE SURGERY    . ORIF ANKLE FRACTURE Right 1985  . RIGHT/LEFT HEART CATH AND CORONARY ANGIOGRAPHY N/A 02/14/2018   Procedure: RIGHT/LEFT HEART CATH AND CORONARY ANGIOGRAPHY;  Surgeon: Yvonne Kendall, MD;  Location: [facility] INVASIVE CV LAB;  Service: Cardiovascular;  Laterality: N/A;  . TONSILLECTOMY       Home Medications:  Prior to Admission medications   Medication Sig Start Date End Date Taking? Authorizing Provider  aspirin EC 81 MG EC tablet Take 1 tablet (81 mg total) by mouth daily. 02/18/18  Yes Duke, Roe Rutherford, PA  atorvastatin (LIPITOR) 80 MG tablet Take 1 tablet (80 mg total) by mouth daily at 6 PM. 02/17/18  Yes Duke, Roe Rutherford, PA  carvedilol (COREG) 3.125 MG tablet Take 1 tablet (3.125 mg total) by mouth 2 (two) times daily with a meal. 02/17/18  Yes Duke, Roe Rutherford, PA  digoxin (LANOXIN) 0.125 MG tablet Take 1 tablet (0.125 mg total) by mouth daily. 02/18/18  Yes Duke, Roe Rutherford, PA  furosemide (LASIX) 40 MG tablet Take 1 tablet (40 mg total) by mouth daily. 02/18/18  Yes Duke, Roe Rutherford, PA  losartan (COZAAR) 25 MG tablet Take 1 tablet (25 mg total) by mouth daily. 02/18/18  Yes Duke, Roe Rutherford, PA  nitroGLYCERIN (NITROSTAT) 0.4 MG SL tablet  Place 1 tablet (0.4 mg total) under the tongue every 5 (five) minutes x 3 doses as needed for chest pain. 02/17/18  Yes Duke, Roe Rutherford, PA  spironolactone (ALDACTONE) 25 MG tablet Take 0.5 tablets (12.5 mg total) by mouth at bedtime. 02/17/18  Yes Duke, Roe Rutherford, PA  traMADol (ULTRAM) 50 MG tablet Take 1-2 tablets (50-100 mg total) by mouth 3 (three) times daily as needed. Patient taking differently: Take 50 mg by mouth 3 (three) times daily as needed for moderate pain.  03/25/18  Yes Cristie Hem, PA-C  warfarin (COUMADIN) 5 MG tablet Take 1 tablet (5 mg total) by mouth one time only at 6 PM.  02/17/18  Yes Duke, Roe Rutherford, PA    Inpatient Medications: Scheduled Meds: . aspirin  300 mg Rectal Daily   Or  . aspirin  325 mg Oral Daily  . atorvastatin  80 mg Oral q1800  . carvedilol  3.125 mg Oral BID WC  . digoxin  0.125 mg Oral Daily  . losartan  25 mg Oral Daily  . warfarin  7.5 mg Oral ONCE-1800  . Warfarin - Pharmacist Dosing Inpatient   Does not apply q1800   Continuous Infusions: . heparin 950 Units/hr (06/26/18 1607)   PRN Meds: acetaminophen **OR** acetaminophen (TYLENOL) oral liquid 160 mg/5 mL **OR** acetaminophen, traMADol  Allergies:    Allergies  Allergen Reactions  . Methocarbamol Nausea And Vomiting and Anxiety    Social History:   Social History   Socioeconomic History  . Marital status: Divorced    Spouse name: Not on file  . Number of children: Not on file  . Years of education: Not on file  . Highest education level: Not on file  Occupational History  . Not on file  Social Needs  . Financial resource strain: Not on file  . Food insecurity:    Worry: Not on file    Inability: Not on file  . Transportation needs:    Medical: Not on file    Non-medical: Not on file  Tobacco Use  . Smoking status: Current Every Day Smoker    Packs/day: 0.50    Years: 41.00    Pack years: 20.50    Types: Cigarettes  . Smokeless tobacco: Never Used  Substance and Sexual Activity  . Alcohol use: Yes    Alcohol/week: 0.6 oz    Types: 1 Cans of beer per week  . Drug use: Not Currently  . Sexual activity: Yes  Lifestyle  . Physical activity:    Days per week: Not on file    Minutes per session: Not on file  . Stress: Not on file  Relationships  . Social connections:    Talks on phone: Not on file    Gets together: Not on file    Attends religious service: Not on file    Active member of club or organization: Not on file    Attends meetings of clubs or organizations: Not on file    Relationship status: Not on file  . Intimate partner  violence:    Fear of current or ex partner: Not on file    Emotionally abused: Not on file    Physically abused: Not on file    Forced sexual activity: Not on file  Other Topics Concern  . Not on file  Social History Narrative  . Not on file    Family History:   History reviewed. No pertinent family history.   ROS:  Please see  the history of present illness.   All other ROS reviewed and negative.     Physical Exam/Data:   Vitals:   06/26/18 0423 06/26/18 0838 06/26/18 1153 06/26/18 1534  BP: 113/74 109/71 106/69 115/69  Pulse: (!) 58 (!) 52 63 60  Resp: 18 18 18 18   Temp: 98.3 F (36.8 C) 97.7 F (36.5 C) 97.8 F (36.6 C) 98 F (36.7 C)  TempSrc: Oral Oral Oral Oral  SpO2: 100% 100% 100%   Weight:      Height:        Intake/Output Summary (Last 24 hours) at 06/26/2018 1615 Last data filed at 06/26/2018 1100 Gross per 24 hour  Intake 751.18 ml  Output -  Net 751.18 ml   Filed Weights   06/24/18 0851 06/25/18 1000  Weight: 135 lb (61.2 kg) 130 lb 15.3 oz (59.4 kg)   Body mass index is 19.34 kg/m.  General:  Well nourished, well developed, in no acute distress HEENT: normal Lymph: no adenopathy Neck: no JVD Endocrine:  No thryomegaly Vascular: No carotid bruits; FA pulses 2+ bilaterally without bruits  Cardiac:  normal S1, S2; RRR; no murmur  Lungs:  clear to auscultation bilaterally, no wheezing, rhonchi or rales  Abd: soft, nontender, no hepatomegaly  Ext: no edema Musculoskeletal:  No deformities, BUE and BLE strength normal and equal Skin: warm and dry  Neuro:  CNs 2-12 intact, no focal abnormalities noted Psych:  Normal affect   EKG:  The EKG was personally reviewed and demonstrates: Not performed today Telemetry:  Telemetry was personally reviewed and demonstrates: Sinus rhythm  Relevant CV Studies: 2D echocardiogram is pending  Laboratory Data:  Chemistry Recent Labs  Lab 06/24/18 0900 06/25/18 0418  NA 141 140  K 4.3 4.5  CL 107 106    CO2 25 25  GLUCOSE 105* 93  BUN 9 11  CREATININE 1.03 0.98  CALCIUM 9.4 8.9  GFRNONAA >60 >60  GFRAA >60 >60  ANIONGAP 9 9    Recent Labs  Lab 06/24/18 0900 06/25/18 0418  PROT 6.9 6.0*  ALBUMIN 3.7 3.2*  AST 19 17  ALT 11 9  ALKPHOS 60 56  BILITOT 1.0 0.9   Hematology Recent Labs  Lab 06/24/18 0900 06/25/18 0418 06/26/18 0430  WBC 4.9 4.9 4.4  RBC 4.60 4.20* 4.04*  HGB 13.8 12.9* 12.1*  HCT 44.5 40.6 38.2*  MCV 96.7 96.7 94.6  MCH 30.0 30.7 30.0  MCHC 31.0 31.8 31.7  RDW 14.7 14.6 14.1  PLT 193 168 161   Cardiac EnzymesNo results for input(s): TROPONINI in the last 168 hours. No results for input(s): TROPIPOC in the last 168 hours.  BNPNo results for input(s): BNP, PROBNP in the last 168 hours.  DDimer No results for input(s): DDIMER in the last 168 hours.  Radiology/Studies:  Ct Head Wo Contrast  Result Date: 06/24/2018 CLINICAL DATA:  Altered level of consciousness. Difficulty speaking. EXAM: CT HEAD WITHOUT CONTRAST TECHNIQUE: Contiguous axial images were obtained from the base of the skull through the vertex without intravenous contrast. COMPARISON:  None. FINDINGS: Brain: Mild generalized volume loss with commensurate dilatation of the ventricles and sulci. No hydrocephalus. Mild chronic small vessel ischemic changes within the bilateral periventricular white matter. No mass, hemorrhage, edema or other evidence of acute parenchymal abnormality. No extra-axial hemorrhage. Vascular: No hyperdense vessel or unexpected calcification. Skull: Normal. Negative for fracture or focal lesion. Sinuses/Orbits: Chronic appearing depression deformity of the medial LEFT orbital wall. No acute findings. Visualized upper paranasal  sinuses are clear. Other: None. IMPRESSION: 1. No acute findings.  No intracranial mass, hemorrhage or edema. 2. Mild chronic small vessel ischemic changes within the white matter. Electronically Signed   By: Bary Richard M.D.   On: 06/24/2018 09:46    Mr Brain Wo Contrast  Result Date: 06/24/2018 CLINICAL DATA:  Focal neurologic deficit.  Aphasia.  Headache. EXAM: MRI HEAD WITHOUT CONTRAST TECHNIQUE: Multiplanar, multiecho pulse sequences of the brain and surrounding structures were obtained without intravenous contrast. COMPARISON:  Head CT 06/24/2018 FINDINGS: BRAIN: Medium-sized area abnormal diffusion restriction in the left parietal lobe with a small, 5 mm focus also in the right frontal lobe. The midline structures are normal. There is hyperintense T2-weighted signal corresponding to the areas of diffusion restriction. Multifocal white matter hyperintensity, most commonly due to chronic ischemic microangiopathy. The CSF spaces are normal for age, with no hydrocephalus. 5 mm focus of magnetic susceptibility effect in the posterior left parietal lobe with associated hyperintensity on T1-weighted imaging is likely a small cavernous angioma. No acute hemorrhage. VASCULAR: Major intracranial arterial and venous sinus flow voids are preserved. SKULL AND UPPER CERVICAL SPINE: The visualized skull base, calvarium, upper cervical spine and extracranial soft tissues are normal. SINUSES/ORBITS: No fluid levels or advanced mucosal thickening. No mastoid or middle ear effusion. The orbits are normal. IMPRESSION: 1. Multifocal acute ischemia with left parietal lobe infarct measuring up to 17 mm and small right frontal infarct measuring up to 5 mm. No hemorrhage or mass effect. 2. Mild chronic microvascular ischemia. 3. Small focus of magnetic susceptibility effect in the posterior left parietal lobe is likely an incidental small cavernous angioma. Electronically Signed   By: Deatra Robinson M.D.   On: 06/24/2018 22:09   Mr Maxine Glenn Head Wo Contrast  Result Date: 06/25/2018 CLINICAL DATA:  62 year old male with headache and aphasia found to have small acute infarcts in the posterior left temporal and parietal lobe on MRI yesterday. Superimposed small 5 millimeter  contralateral right frontal lobe white matter infarct also. EXAM: MRA HEAD WITHOUT CONTRAST TECHNIQUE: Angiographic images of the Circle of Willis were obtained using MRA technique without intravenous contrast. COMPARISON:  Brain MRI 06/24/2018. FINDINGS: Antegrade flow in the posterior circulation. Mildly dominant distal left vertebral artery. Normal PICA origins and no distal vertebral stenosis. Patent basilar artery with mild tortuosity but no stenosis. Normal SCA and PCA origins. Mildly tortuous P1 segments. Posterior communicating arteries are diminutive or absent. Bilateral PCA branches are within normal limits. Antegrade flow in both ICA siphons. The left siphon is mildly dominant. Mild bilateral ICA tortuosity with no siphon stenosis. Normal right ophthalmic artery origin. The left origins not clearly identified. Patent carotid termini with normal MCA and ACA origins. The left A1 is dominant. The anterior communicating artery and proximal A2 segments are mildly tortuous. There is a prominent median artery of the corpus callosum, normal variant. Both MCA M1 segments are tortuous without stenosis. Bilateral MCA bi/tri-furcations are patent without stenosis. The visible MCA branches are within normal limits. There is faint MRA signal in the left posterior parietal lobe focus of hemosiderin described yesterday (series 3, image 124). There is similar gyriform signal in nearby left parietal lobe cortex which appears related to chronic encephalomalacia/laminar necrosis. IMPRESSION: 1. Mildly tortuous intracranial arteries with no stenosis or other circle of Willis abnormality. 2. Chronic posterior left parietal lobe cortical encephalomalacia superimposed on the acute ischemia. 3. 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 Electronically  Signed   By: Odessa Fleming M.D.   On: 06/25/2018 09:25    Assessment and Plan:   1. LV  mural thrombus- history of LV mural thrombus demonstrated by cardiac MRI March 2019.  Definity  echo did show swirling at the LV apex.  He was discharged on Lovenox bridge and Coumadin at the at that time although he has been noncompliant with his medications.  He has not checked his INRs since.  His INR on admission was 1.13. 2. Ischemic cardiomyopathy- cardiac catheterization performed in March of this year by Dr. and revealed an occluded ostial LAD.  Cardiac MRI showed that the myocardium in the LAD territory was nonviable.  He has been on carvedilol digoxin and losartan as an outpatient.  He does not appear to be volume overloaded today. 3. Essential hypertension-blood pressure controlled on current medications 4. Hyperlipidemia-on atorvastatin at home  At this point, I think the major issue is medication noncompliance.  I suspect that his stroke was related to inadequate anticoagulation in setting of an LV mural thrombus.  I agree with IV heparin/Coumadin crossover.  He can be discharged when his INR is in the mid 2 range.  He will need close office follow-up.  No further recommendations at this time.  [facility] will sign off.   Medication Recommendations: Coumadin with INR goal of 2-3 Other recommendations (labs, testing, etc): Continue carvedilol, losartan and digoxin Follow up as an outpatient: Dr. Nanetta Batty 2 to 3 weeks after discharge.  He will also need an appointment in our Coumadin clinic at [facility] for follow-up of his INRs.   For questions or updates, please contact [facility] Please consult [email or web address] for contact info under Cardiology/STEMI.   Signed, Nanetta Batty, MD  06/26/2018 4:15 PM

## 2018-06-26 NOTE — Progress Notes (Signed)
ANTICOAGULATION CONSULT NOTE   Pharmacy Consult for heparin Indication: LV clot   Allergies  Allergen Reactions  . Methocarbamol Nausea And Vomiting and Anxiety    Patient Measurements: Height: 5\' 9"  (175.3 cm) Weight: 130 lb 15.3 oz (59.4 kg) IBW/kg (Calculated) : 70.7 Heparin Dosing Weight: 59.4  Vital Signs: Temp: 98.3 F (36.8 C) (07/24 0423) Temp Source: Oral (07/24 0423) BP: 113/74 (07/24 0423) Pulse Rate: 58 (07/24 0423)  Labs: Recent Labs    06/24/18 0900 06/24/18 1910 06/25/18 0418 06/25/18 1938 06/26/18 0430  HGB 13.8  --  12.9*  --  12.1*  HCT 44.5  --  40.6  --  38.2*  PLT 193  --  168  --  161  LABPROT  --  14.4  --   --  15.0  INR  --  1.13  --   --  1.19  HEPARINUNFRC  --   --   --  <0.10* 0.31  CREATININE 1.03  --  0.98  --   --       Assessment: 62 yo M presents to ER with difficulty speaking and found to have an acute stroke. PMH includes cardiomyopathy, CAD, HTN, chronic back pain, bipolar disorder, tobacco abuse, LV thrombus. Patient on warfarin 7.5 mg MF and 5 mg all other days per last clinic note. Pharmacy consulted for warfarin and heparin dosing for LV thrombus.   Heparin level therapeutic this am   Goal of Therapy:  Heparin level goal 0.3-0.5 Monitor platelets by anticoagulation protocol: Yes   Plan:  Continue heparin drip 950 units/hour Continue to monitor H/H, CBC, daily INR, daily heparin level and bleeding   Talbert CageLora Yi Haugan, PharmD 06/26/2018   6:03 AM

## 2018-06-27 LAB — CBC
HEMATOCRIT: 37.7 % — AB (ref 39.0–52.0)
Hemoglobin: 12 g/dL — ABNORMAL LOW (ref 13.0–17.0)
MCH: 30.1 pg (ref 26.0–34.0)
MCHC: 31.8 g/dL (ref 30.0–36.0)
MCV: 94.5 fL (ref 78.0–100.0)
Platelets: 149 10*3/uL — ABNORMAL LOW (ref 150–400)
RBC: 3.99 MIL/uL — ABNORMAL LOW (ref 4.22–5.81)
RDW: 13.7 % (ref 11.5–15.5)
WBC: 4.3 10*3/uL (ref 4.0–10.5)

## 2018-06-27 LAB — PROTIME-INR
INR: 1.2
Prothrombin Time: 15.1 seconds (ref 11.4–15.2)

## 2018-06-27 LAB — HEPARIN LEVEL (UNFRACTIONATED): Heparin Unfractionated: 0.41 IU/mL (ref 0.30–0.70)

## 2018-06-27 MED ORDER — WARFARIN SODIUM 5 MG PO TABS: 10.0000 mg | ORAL_TABLET | Freq: Once | ORAL | Status: DC

## 2018-06-27 MED ORDER — WARFARIN SODIUM 5 MG PO TABS
ORAL_TABLET | ORAL | 0 refills | Status: DC
Start: 2018-06-27 — End: 2018-08-29

## 2018-06-27 MED ORDER — WARFARIN SODIUM 5 MG PO TABS
10.0000 mg | ORAL_TABLET | Freq: Once | ORAL | Status: DC
  Filled 2018-06-27: qty 2

## 2018-06-27 NOTE — Discharge Summary (Signed)
Physician Discharge Summary  Ricardo CabalDavid Freiberger XBJ:[id number] DOB: 02-05-1956 DOA: 06/24/2018  PCP: Gilda CreasePavelock, Richard M, MD  Admit date: 06/24/2018 Discharge date: 06/27/2018  Admitted From: Home Disposition:  Signed AMA    Brief/Interim Summary:  Patient is a 62 year old male with past medical history of ischemic cardiomyopathy, coronary disease, hypertension, left ventricular thrombuson coumadin, chronic back pain, bipolar disorder,who presents to the emergency department with complaints of dysarthria. MRI of the brain done in the emergency department showed multifocal stroke. His INR was subtherapeutic. Neurology was following.  Cardiology was also consulted for history of left ventricular thrombus.  He was noncompliant with Coumadin and this is the most possible etiology for stroke.  He was started on heparin drip and warfarin bridge.  Cardiology strongly recommend not to discharge him until INR is around 2.  His INR was still subtherapeutic today.  Despite repeated counseling and advice, he wanted to leave and he signed AMA.  I have sent a prescription of warfarin to his pharmacy and also recommended him to follow-up with his cardiologist for regular monitoring of INR.  Following problems were addressed during his hospitalization:  Acute CVA: MRI showed multifocal acute ischemia with left parietal lobe infarct measuring up to 17 mm and small right frontal infarct measuring up to 5 mm. No hemorrhage or mass effect.  Currently on aspirin and Lipitor.  Patient evaluated by PT.No follow-up recommended.  Carotid Doppler did not show any significant carotid artery stenosis.  Neurology was following.  Left ventricular thrombus: Has a history of left ventricular thrombus.  Was on Coumadin but he was not taking.  INR was subtherapeutic on presentation.  Currently bridged on heparin and warfarin.   He was following with cardiology previously but is noncompliant.  Cardiology strongly recommended to  continue heparin and warfarin bridging until INR is 2.Echocardiogram done here did not show any evidence of thrombus.   History of coronary disease: Denies any chest pain.  On aspirin, Lipitor and Coreg at home which we will continue.  History of ischemic cardiomyopathy:  Echocardiogram showed ejection fraction of 25 to 30%, severe diffuse hypokinesis, no evidence of thrombus.  His ejection fraction is same as per Echo  done on 4/19 .On Lasix, digoxin.losartan  and spironolactone at home.    He is noncompliant.  Currently his heart failure is compensated.  History of bipolar disorder: Was on Abilify at home.  History of chronic back pain: Stable     Discharge Diagnoses:  Principal Problem:   Acute CVA (cerebrovascular accident) (HCC) Active Problems:   LV (left ventricular) mural thrombus without MI   Hypertension   Chronic back pain   Bipolar 1 disorder (HCC)   Ischemic cardiomyopathy    Discharge Instructions   Allergies as of 06/27/2018      Reactions   Methocarbamol Nausea And Vomiting, Anxiety      Medication List    TAKE these medications   aspirin 81 MG EC tablet Take 1 tablet (81 mg total) by mouth daily.   atorvastatin 80 MG tablet Commonly known as:  LIPITOR Take 1 tablet (80 mg total) by mouth daily at 6 PM.   carvedilol 3.125 MG tablet Commonly known as:  COREG Take 1 tablet (3.125 mg total) by mouth 2 (two) times daily with a meal.   digoxin 0.125 MG tablet Commonly known as:  LANOXIN Take 1 tablet (0.125 mg total) by mouth daily.   furosemide 40 MG tablet Commonly known as:  LASIX Take 1 tablet (40 mg total)  by mouth daily.   losartan 25 MG tablet Commonly known as:  COZAAR Take 1 tablet (25 mg total) by mouth daily.   nitroGLYCERIN 0.4 MG SL tablet Commonly known as:  NITROSTAT Place 1 tablet (0.4 mg total) under the tongue every 5 (five) minutes x 3 doses as needed for chest pain.   spironolactone 25 MG tablet Commonly known as:   ALDACTONE Take 0.5 tablets (12.5 mg total) by mouth at bedtime.   traMADol 50 MG tablet Commonly known as:  ULTRAM Take 1-2 tablets (50-100 mg total) by mouth 3 (three) times daily as needed. What changed:    how much to take  reasons to take this   warfarin 5 MG tablet Commonly known as:  COUMADIN Take as directed. If you are unsure how to take this medication, talk to your nurse or doctor. Original instructions:  Take 7.5 mg daily for 3 more days then continue usual 5 mg dose daily. [facility] at Dr. Hazle Coca office after 3 days. What changed:    how much to take  how to take this  when to take this  additional instructions       Allergies  Allergen Reactions  . Methocarbamol Nausea And Vomiting and Anxiety    Consultations: Cardiology, neurology  Procedures/Studies: Ct Head Wo Contrast  Result Date: 06/24/2018 CLINICAL DATA:  Altered level of consciousness. Difficulty speaking. EXAM: CT HEAD WITHOUT CONTRAST TECHNIQUE: Contiguous axial images were obtained from the base of the skull through the vertex without intravenous contrast. COMPARISON:  None. FINDINGS: Brain: Mild generalized volume loss with commensurate dilatation of the ventricles and sulci. No hydrocephalus. Mild chronic small vessel ischemic changes within the bilateral periventricular white matter. No mass, hemorrhage, edema or other evidence of acute parenchymal abnormality. No extra-axial hemorrhage. Vascular: No hyperdense vessel or unexpected calcification. Skull: Normal. Negative for fracture or focal lesion. Sinuses/Orbits: Chronic appearing depression deformity of the medial LEFT orbital wall. No acute findings. Visualized upper paranasal sinuses are clear. Other: None. IMPRESSION: 1. No acute findings.  No intracranial mass, hemorrhage or edema. 2. Mild chronic small vessel ischemic changes within the white matter. Electronically Signed   By: Bary Richard M.D.   On: 06/24/2018 09:46    Mr Brain Wo Contrast  Result Date: 06/24/2018 CLINICAL DATA:  Focal neurologic deficit.  Aphasia.  Headache. EXAM: MRI HEAD WITHOUT CONTRAST TECHNIQUE: Multiplanar, multiecho pulse sequences of the brain and surrounding structures were obtained without intravenous contrast. COMPARISON:  Head CT 06/24/2018 FINDINGS: BRAIN: Medium-sized area abnormal diffusion restriction in the left parietal lobe with a small, 5 mm focus also in the right frontal lobe. The midline structures are normal. There is hyperintense T2-weighted signal corresponding to the areas of diffusion restriction. Multifocal white matter hyperintensity, most commonly due to chronic ischemic microangiopathy. The CSF spaces are normal for age, with no hydrocephalus. 5 mm focus of magnetic susceptibility effect in the posterior left parietal lobe with associated hyperintensity on T1-weighted imaging is likely a small cavernous angioma. No acute hemorrhage. VASCULAR: Major intracranial arterial and venous sinus flow voids are preserved. SKULL AND UPPER CERVICAL SPINE: The visualized skull base, calvarium, upper cervical spine and extracranial soft tissues are normal. SINUSES/ORBITS: No fluid levels or advanced mucosal thickening. No mastoid or middle ear effusion. The orbits are normal. IMPRESSION: 1. Multifocal acute ischemia with left parietal lobe infarct measuring up to 17 mm and small right frontal infarct measuring up to 5 mm. No hemorrhage or mass effect. 2. Mild  chronic microvascular ischemia. 3. Small focus of magnetic susceptibility effect in the posterior left parietal lobe is likely an incidental small cavernous angioma. Electronically Signed   By: Deatra Robinson M.D.   On: 06/24/2018 22:09   Mr Maxine Glenn Head Wo Contrast  Result Date: 06/25/2018 CLINICAL DATA:  62 year old male with headache and aphasia found to have small acute infarcts in the posterior left temporal and parietal lobe on MRI yesterday. Superimposed small 5 millimeter  contralateral right frontal lobe white matter infarct also. EXAM: MRA HEAD WITHOUT CONTRAST TECHNIQUE: Angiographic images of the Circle of Willis were obtained using MRA technique without intravenous contrast. COMPARISON:  Brain MRI 06/24/2018. FINDINGS: Antegrade flow in the posterior circulation. Mildly dominant distal left vertebral artery. Normal PICA origins and no distal vertebral stenosis. Patent basilar artery with mild tortuosity but no stenosis. Normal SCA and PCA origins. Mildly tortuous P1 segments. Posterior communicating arteries are diminutive or absent. Bilateral PCA branches are within normal limits. Antegrade flow in both ICA siphons. The left siphon is mildly dominant. Mild bilateral ICA tortuosity with no siphon stenosis. Normal right ophthalmic artery origin. The left origins not clearly identified. Patent carotid termini with normal MCA and ACA origins. The left A1 is dominant. The anterior communicating artery and proximal A2 segments are mildly tortuous. There is a prominent median artery of the corpus callosum, normal variant. Both MCA M1 segments are tortuous without stenosis. Bilateral MCA bi/tri-furcations are patent without stenosis. The visible MCA branches are within normal limits. There is faint MRA signal in the left posterior parietal lobe focus of hemosiderin described yesterday (series 3, image 124). There is similar gyriform signal in nearby left parietal lobe cortex which appears related to chronic encephalomalacia/laminar necrosis. IMPRESSION: 1. Mildly tortuous intracranial arteries with no stenosis or other circle of Willis abnormality. 2. Chronic posterior left parietal lobe cortical encephalomalacia superimposed on the acute ischemia. 3. 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 Electronically Signed   By: Odessa Fleming M.D.   On: 06/25/2018 09:25       Subjective: Counseled for  importance of continued hospitalization until INR is around 2.  Patient willing to leave AGAINST MEDICAL ADVICE.  Discharge Exam: Vitals:   06/27/18 0415 06/27/18 0852  BP: 122/76 112/62  Pulse: 64 64  Resp: 13 18  Temp: 98.2 F (36.8 C) 98.4 F (36.9 C)  SpO2: 100% 100%   Vitals:   06/26/18 2009 06/26/18 2353 06/27/18 0415 06/27/18 0852  BP: 128/80 120/73 122/76 112/62  Pulse: 66 61 64 64  Resp: 13 13 13 18   Temp: 98.6 F (37 C) 97.9 F (36.6 C) 98.2 F (36.8 C) 98.4 F (36.9 C)  TempSrc: Oral Oral Oral Oral  SpO2: 97% 100% 100% 100%  Weight:      Height:           The results of significant diagnostics from this hospitalization (including imaging, microbiology, ancillary and laboratory) are listed below for reference.     Microbiology: No results found for this or any previous visit (from the past 240 hour(s)).   Labs: BNP (last 3 results) Recent Labs    02/12/18 1114  BNP 633.4*   Basic Metabolic Panel: Recent Labs  Lab 06/24/18 0900 06/25/18 0418  NA 141 140  K 4.3 4.5  CL 107 106  CO2 25 25  GLUCOSE 105* 93  BUN 9 11  CREATININE 1.03 0.98  CALCIUM 9.4 8.9  MG 1.7  --  Liver Function Tests: Recent Labs  Lab 06/24/18 0900 06/25/18 0418  AST 19 17  ALT 11 9  ALKPHOS 60 56  BILITOT 1.0 0.9  PROT 6.9 6.0*  ALBUMIN 3.7 3.2*   No results for input(s): LIPASE, AMYLASE in the last 168 hours. No results for input(s): AMMONIA in the last 168 hours. CBC: Recent Labs  Lab 06/24/18 0900 06/25/18 0418 06/26/18 0430 06/27/18 0350  WBC 4.9 4.9 4.4 4.3  NEUTROABS 2.2  --   --   --   HGB 13.8 12.9* 12.1* 12.0*  HCT 44.5 40.6 38.2* 37.7*  MCV 96.7 96.7 94.6 94.5  PLT 193 168 161 149*   Cardiac Enzymes: No results for input(s): CKTOTAL, CKMB, CKMBINDEX, TROPONINI in the last 168 hours. BNP: Invalid input(s): POCBNP CBG: No results for input(s): GLUCAP in the last 168 hours. D-Dimer No results for input(s): DDIMER in the last 72  hours. Hgb A1c Recent Labs    06/25/18 0418  HGBA1C 5.6   Lipid Profile Recent Labs    06/25/18 0418  CHOL 183  HDL 39*  LDLCALC 132*  TRIG 61  CHOLHDL 4.7   Thyroid function studies No results for input(s): TSH, T4TOTAL, T3FREE, THYROIDAB in the last 72 hours.  Invalid input(s): FREET3 Anemia work up No results for input(s): VITAMINB12, FOLATE, FERRITIN, TIBC, IRON, RETICCTPCT in the last 72 hours. Urinalysis No results found for: COLORURINE, APPEARANCEUR, LABSPEC, PHURINE, GLUCOSEU, HGBUR, BILIRUBINUR, KETONESUR, PROTEINUR, UROBILINOGEN, NITRITE, LEUKOCYTESUR Sepsis Labs Invalid input(s): PROCALCITONIN,  WBC,  LACTICIDVEN Microbiology No results found for this or any previous visit (from the past 240 hour(s)).  Please note: You were cared for by a hospitalist during your hospital stay. Once you are discharged, your primary care physician will handle any further medical issues. Please note that NO REFILLS for any discharge medications will be authorized once you are discharged, as it is imperative that you return to your primary care physician (or establish a relationship with a primary care physician if you do not have one) for your post hospital discharge needs so that they can reassess your need for medications and monitor your lab values.    Time coordinating discharge: 40 minutes  SIGNED:   Burnadette Pop, MD  [facility] 06/27/2018, 2:40 PM Pager [phone]  If 7PM-7AM, please contact night-coverage [email or web address] Password [email or web address]

## 2018-06-27 NOTE — Plan of Care (Addendum)
Patient left AMA. Refused to sign paperwork. PIV removed by patient, catheter intact.

## 2018-06-27 NOTE — Progress Notes (Signed)
ANTICOAGULATION CONSULT NOTE - Initial Consult  Pharmacy Consult for warfarin and heparin dosing Indication: LV clot   Allergies  Allergen Reactions  . Methocarbamol Nausea And Vomiting and Anxiety    Patient Measurements: Height: 5\' 9"  (175.3 cm) Weight: 130 lb 15.3 oz (59.4 kg) IBW/kg (Calculated) : 70.7 Heparin Dosing Weight: 59.4  Vital Signs: Temp: 98.4 F (36.9 C) (07/25 0852) Temp Source: Oral (07/25 0852) BP: 112/62 (07/25 0852) Pulse Rate: 64 (07/25 0852)  Labs: Recent Labs    06/24/18 1910  06/25/18 0418  06/26/18 0430 06/26/18 1045 06/27/18 0350  HGB  --    < > 12.9*  --  12.1*  --  12.0*  HCT  --   --  40.6  --  38.2*  --  37.7*  PLT  --   --  168  --  161  --  149*  LABPROT 14.4  --   --   --  15.0  --  15.1  INR 1.13  --   --   --  1.19  --  1.20  HEPARINUNFRC  --   --   --    < > 0.31 0.45 0.41  CREATININE  --   --  0.98  --   --   --   --    < > = values in this interval not displayed.    Estimated Creatinine Clearance: 68.2 mL/min (by C-G formula based on SCr of 0.98 mg/dL).   Medical History: Past Medical History:  Diagnosis Date  . Arthritis    "right hip" (06/25/2018)  . Bipolar 1 disorder (HCC)   . CHF (congestive heart failure) (HCC)   . Childhood asthma   . Chronic lower back pain   . Headache    "monthly" (06/25/2018)  . High cholesterol   . Hip pain, chronic   . Hypertension   . Pneumonia 1990s X 1  . PTSD (post-traumatic stress disorder)   . Stroke [facility](HCC) 06/24/2018   left parietal CVA/notes 06/25/2018 "speech comes and goes" (06/25/2018)     Assessment: 62 yo M presents to ER with difficulty speaking and found to have an acute stroke. PMH includes cardiomyopathy, CAD, HTN, chronic back pain, bipolar disorder, tobacco abuse, LV thrombus. Patient on warfarin 7.5 mg MF and 5 mg all other days per last clinic note. Pharmacy consulted for warfarin and heparin dosing for LV thrombus.   INR 1.20, Hgb 12.0, no s/s of bleeding  noted Heparin level 0.41   Goal of Therapy:  INR goal 2-3  Heparin level goal 0.3-0.5 Monitor platelets by anticoagulation protocol: Yes   Plan:  Continue heparin drip at 950 units/hour Heparin level in AM warfarin 10mg  x 1 dose  Decrease ASA to 81mg  while on warfarin? Continue to monitor H/H, CBC, daily INR, daily heparin level and bleeding   Bradley FerrisJoshua Alvar Malinoski, PharmD PGY1 Pharmacy Resident Direct Phone: [phone] 06/27/2018  9:56 AM

## 2018-08-08 ENCOUNTER — Telehealth: Payer: Self-pay | Admitting: Cardiovascular Disease

## 2018-08-08 NOTE — Telephone Encounter (Signed)
New Message :     Patient is requesting a call back, you have questions about his medications.

## 2018-08-08 NOTE — Telephone Encounter (Signed)
Returned call to patient no answer.LMTC. 

## 2018-08-12 NOTE — Telephone Encounter (Signed)
Attempted to reach patient at number provided. VM was for a business - did not leave message

## 2018-08-13 NOTE — Telephone Encounter (Signed)
Called patient and advised of his medication questions.  He takes: Warfarin Spironolactone Atorvastatin Losartan  Carvedilol Furosemide Aspirin Digoxin  He says that mostly when he takes the Digoxin and the Spironolactone he becomes very incoherent, and becomes confused and unable to say his words correctly. He would like to know if any of these medications could cause that issue.   Patient had appointment with Angie on 08/26/18. Patient states he is almost out of his Warfarin but has not has an INR since July, I advised patient I would pass that information as well.

## 2018-08-13 NOTE — Telephone Encounter (Signed)
Called patient, advised of note from PharmD. Patient will keep appointment to be seen.  No other questions at this time.

## 2018-08-13 NOTE — Telephone Encounter (Signed)
1.Confusion is not an expected adverse effect for spironolactone or digoxin. Recent levels between 0.2 to 0.4. We will need to monitor medication levels for better assessment.   2. Patient "no show" to his coumadin clinic appointments.  Recommendations:  - keep appointment with Micah Flesher for spironolactone and digoxin assessment   - Will check INR during office visit and schedule f/u as needed (may need to consider transition to Eliquis during OV)

## 2018-08-25 NOTE — Progress Notes (Signed)
Cardiology Office Note:    Date:  08/26/2018   ID:  Ricardo Brooks, DOB [birth year], MRN [id number]  PCP:  Gilda CreasePavelock, Richard M, MD  Cardiologist:  Nanetta BattyJonathan Berry, MD   Referring MD: Gilda CreasePavelock, Richard M, MD   Chief Complaint  Patient presents with  . Follow-up    LV thrombus, medication noncompliance    History of Present Illness:    Ricardo CabalDavid Brooks is a [age] y.o. male with a hx of severe LV dysfunction and LV mural thrombus.  Cardiology was asked to evaluate LV mural thrombus in the setting of a stroke with dysarthria which resolved.  He also has a history of hypertension, hyperlipidemia, and diabetes.  He was admitted on 02/12/2018 with heart failure found to have severe LV dysfunction.  Myoview was high risk and a right/left heart cath revealed occluded ostial LAD and otherwise no significant CAD.  Viability study showed scar in the LAD territory with LV layered mural thrombus.  He was discharged on Lovenox bridge to Coumadin with an INR target of 2-3.  He was seen by Dr. Gery PrayBarry on 06/26/2018 and consult.  He did not take his Coumadin during the prior week and was admitted with aphasia.  His admission INR was 1.13.  His stroke was suspected to be related to his inadequate anticoagulation in the setting of an LV mural thrombus.  Medication noncompliance was a concern.  He presents today for hospital follow-up. Carotid dopplers 06/25/18 with mild disease.  On anticoagulation for LV thrombus. Echo 06/26/18 with EF of 25-30% and no LV thrombus noted, but findings suggest high risk for apical LV thrombus formation and embolism.   He has missed several INR checks.  INR today was 1.1.  He has not been taking his Coumadin.  He is also not been compliant on medications.  He is taking his digoxin at 0.125 mg.  He is taking 12-1/2 mg of losartan, supposed to be on 25 mg of losartan.  He is taking 12.5 mg of spironolactone.  He has been taking 20 mg lasix daily for three weeks. He appears euvolemic on exam today.     Past Medical History:  Diagnosis Date  . Arthritis    "right hip" (06/25/2018)  . Bipolar 1 disorder (HCC)   . CHF (congestive heart failure) (HCC)   . Childhood asthma   . Chronic lower back pain   . Headache    "monthly" (06/25/2018)  . High cholesterol   . Hip pain, chronic   . Hypertension   . Pneumonia 1990s X 1  . PTSD (post-traumatic stress disorder)   . Stroke [facility](HCC) 06/24/2018   left parietal CVA/notes 06/25/2018 "speech comes and goes" (06/25/2018)    Past Surgical History:  Procedure Laterality Date  . FRACTURE SURGERY    . ORIF ANKLE FRACTURE Right 1985  . RIGHT/LEFT HEART CATH AND CORONARY ANGIOGRAPHY N/A 02/14/2018   Procedure: RIGHT/LEFT HEART CATH AND CORONARY ANGIOGRAPHY;  Surgeon: Yvonne KendallEnd, Christopher, MD;  Location: [facility];  Service: Cardiovascular;  Laterality: N/A;  . TONSILLECTOMY      Current Medications: Current Meds  Medication Sig  . aspirin EC 81 MG EC tablet Take 1 tablet (81 mg total) by mouth daily.  Marland Kitchen. atorvastatin (LIPITOR) 80 MG tablet Take 1 tablet (80 mg total) by mouth daily at 6 PM.  . digoxin (LANOXIN) 0.125 MG tablet Take 1 tablet (0.125 mg total) by mouth daily.  . furosemide (LASIX) 40 MG tablet Take 0.5 tablets (20 mg total) by mouth  daily.  . losartan (COZAAR) 25 MG tablet Take 0.5 tablets (12.5 mg total) by mouth daily.  . nitroGLYCERIN (NITROSTAT) 0.4 MG SL tablet Place 1 tablet (0.4 mg total) under the tongue every 5 (five) minutes x 3 doses as needed for chest pain.  Marland Kitchen spironolactone (ALDACTONE) 25 MG tablet Take 0.5 tablets (12.5 mg total) by mouth at bedtime.  . traMADol (ULTRAM) 50 MG tablet Take 1-2 tablets (50-100 mg total) by mouth 3 (three) times daily as needed. (Patient taking differently: Take 50 mg by mouth 3 (three) times daily as needed for moderate pain. )  . warfarin (COUMADIN) 5 MG tablet Take 7.5 mg daily for 3 more days then continue usual 5 mg dose daily. Follow up at [facility] at Dr. Hazle Coca office  after 3 days.  . [DISCONTINUED] atorvastatin (LIPITOR) 80 MG tablet Take 1 tablet (80 mg total) by mouth daily at 6 PM.  . [DISCONTINUED] carvedilol (COREG) 3.125 MG tablet Take 1.562 mg by mouth daily.  . [DISCONTINUED] digoxin (LANOXIN) 0.125 MG tablet Take 1 tablet (0.125 mg total) by mouth daily.  . [DISCONTINUED] furosemide (LASIX) 40 MG tablet Take 1 tablet (40 mg total) by mouth daily.  . [DISCONTINUED] losartan (COZAAR) 25 MG tablet Take 12.5 mg by mouth daily.  . [DISCONTINUED] spironolactone (ALDACTONE) 25 MG tablet Take 0.5 tablets (12.5 mg total) by mouth at bedtime.     Allergies:   Methocarbamol   Social History   Socioeconomic History  . Marital status: Divorced    Spouse name: Not on file  . Number of children: Not on file  . Years of education: Not on file  . Highest education level: Not on file  Occupational History  . Not on file  Social Needs  . Financial resource strain: Not on file  . Food insecurity:    Worry: Not on file    Inability: Not on file  . Transportation needs:    Medical: Not on file    Non-medical: Not on file  Tobacco Use  . Smoking status: Current Every Day Smoker    Packs/day: 0.50    Years: 41.00    Pack years: 20.50    Types: Cigarettes  . Smokeless tobacco: Never Used  Substance and Sexual Activity  . Alcohol use: Yes    Alcohol/week: 1.0 standard drinks    Types: 1 Cans of beer per week  . Drug use: Not Currently  . Sexual activity: Yes  Lifestyle  . Physical activity:    Days per week: Not on file    Minutes per session: Not on file  . Stress: Not on file  Relationships  . Social connections:    Talks on phone: Not on file    Gets together: Not on file    Attends religious service: Not on file    Active member of club or organization: Not on file    Attends meetings of clubs or organizations: Not on file    Relationship status: Not on file  Other Topics Concern  . Not on file  Social History Narrative  . Not on file      Family History: The patient's he does not know his family medical history  ROS:   Please see the history of present illness.     All other systems reviewed and are negative.  EKGs/Labs/Other Studies Reviewed:    The following studies were reviewed today:  Echo 06/26/18: Study Conclusions  - Left ventricle: The cavity size was mildly dilated.  Wall   thickness was normal. Systolic function was severely reduced. The   estimated ejection fraction was in the range of 25% to 30%.   Severe diffuse hypokinesis with distinct regional wall motion   abnormalities. Akinesis of the mid-apicalanteroseptal, anterior,   and apical myocardium; consistent with infarction in the   distribution of the left anterior descending coronary artery.   Doppler parameters are consistent with restrictive physiology,   indicative of decreased left ventricular diastolic compliance   and/or increased left atrial pressure. No evidence of thrombus. - Mitral valve: There was moderate to severe regurgitation directed   centrally. - Left atrium: The atrium was moderately dilated. - Tricuspid valve: There was mild-moderate regurgitation directed   centrally. - Pulmonary arteries: Systolic pressure was mildly increased. PA   peak pressure: 38 mm Hg (S).  Impressions: - Although no intracavitary LV thrombus is seen, the findings   suggests high risk for apical LV thrombus formation and embolism.  EKG:  EKG is not ordered today.   Recent Labs: 02/12/2018: B Natriuretic Peptide 633.4; TSH 1.651 06/24/2018: Magnesium 1.7 06/25/2018: ALT 9; BUN 11; Creatinine, Ser 0.98; Potassium 4.5; Sodium 140 06/27/2018: Hemoglobin 12.0; Platelets 149  Recent Lipid Panel    Component Value Date/Time   CHOL 183 06/25/2018 0418   TRIG 61 06/25/2018 0418   HDL 39 (L) 06/25/2018 0418   CHOLHDL 4.7 06/25/2018 0418   VLDL 12 06/25/2018 0418   LDLCALC 132 (H) 06/25/2018 0418    Physical Exam:    VS:  BP 116/82   Pulse  67   Ht 5\' 9"  (1.753 m)   Wt 136 lb (61.7 kg)   SpO2 99%   BMI 20.08 kg/m     Wt Readings from Last 3 Encounters:  08/26/18 136 lb (61.7 kg)  06/25/18 130 lb 15.3 oz (59.4 kg)  02/25/18 138 lb 6.4 oz (62.8 kg)     GEN:  Well nourished, well developed in no acute distress HEENT: Normal NECK: No JVD; No carotid bruits CARDIAC: RRR, no murmurs, rubs, gallops RESPIRATORY:  Clear to auscultation without rales, wheezing or rhonchi  ABDOMEN: Soft, non-tender, non-distended MUSCULOSKELETAL:  No edema; No deformity  SKIN: Warm and dry NEUROLOGIC:  Alert and oriented x 3 PSYCHIATRIC:  Normal affect   ASSESSMENT:    1. LV (left ventricular) mural thrombus without MI   2. Ischemic cardiomyopathy   3. Coronary artery disease involving native heart without angina pectoris, unspecified vessel or lesion type   4. History of medication noncompliance   5. Medication management    PLAN:    In order of problems listed above:  LV (left ventricular) mural thrombus without MI INR checked today was 1.1. He ran out of coumadin last week. Seen by pharmacy.   Ischemic cardiomyopathy Coronary artery disease involving native heart without angina pectoris, unspecified vessel or lesion type He appears euvolemic on exam. He has been taking 20 mg lasix only. He has also been taking 80 mg lipitor, 12.5 mg spiro, 12.5 mg losartan, and 1/2 tablet of 3.125 mg coreg once daily. I encouraged him to weigh daily. Because he appears euvolemic on exam, will continue the above medications with the exception of coreg. Will change this to 12.5 mg toprol for better medication compliance.   History of medication noncompliance Medication management - Plan: Basic Metabolic Panel (BMET) As above.    Follow up in 3 months.    Medication Adjustments/Labs and Tests Ordered: Current medicines are reviewed at length with the  patient today.  Concerns regarding medicines are outlined above.  Orders Placed This Encounter    Procedures  . Basic Metabolic Panel (BMET)   Meds ordered this encounter  Medications  . furosemide (LASIX) 40 MG tablet    Sig: Take 0.5 tablets (20 mg total) by mouth daily.    Dispense:  45 tablet    Refill:  3  . losartan (COZAAR) 25 MG tablet    Sig: Take 0.5 tablets (12.5 mg total) by mouth daily.    Dispense:  45 tablet    Refill:  3  . spironolactone (ALDACTONE) 25 MG tablet    Sig: Take 0.5 tablets (12.5 mg total) by mouth at bedtime.    Dispense:  45 tablet    Refill:  3  . metoprolol succinate (TOPROL XL) 25 MG 24 hr tablet    Sig: Take 0.5 tablets (12.5 mg total) by mouth daily.    Dispense:  45 tablet    Refill:  3  . atorvastatin (LIPITOR) 80 MG tablet    Sig: Take 1 tablet (80 mg total) by mouth daily at 6 PM.    Dispense:  90 tablet    Refill:  3  . digoxin (LANOXIN) 0.125 MG tablet    Sig: Take 1 tablet (0.125 mg total) by mouth daily.    Dispense:  90 tablet    Refill:  3    Signed, Marcelino Duster, [place]  08/26/2018 5:07 PM    [facility]

## 2018-08-26 ENCOUNTER — Ambulatory Visit (INDEPENDENT_AMBULATORY_CARE_PROVIDER_SITE_OTHER): Payer: Medicaid Other | Admitting: Pharmacist Clinician (PhC)/ Clinical Pharmacy Specialist

## 2018-08-26 ENCOUNTER — Encounter: Payer: Self-pay | Admitting: Physician Assistant

## 2018-08-26 ENCOUNTER — Ambulatory Visit (INDEPENDENT_AMBULATORY_CARE_PROVIDER_SITE_OTHER): Payer: Medicaid Other | Admitting: Physician Assistant

## 2018-08-26 VITALS — BP 116/82 | HR 67 | Ht 69.0 in | Wt 136.0 lb

## 2018-08-26 DIAGNOSIS — I513 Intracardiac thrombosis, not elsewhere classified: Secondary | ICD-10-CM

## 2018-08-26 DIAGNOSIS — I2510 Atherosclerotic heart disease of native coronary artery without angina pectoris: Secondary | ICD-10-CM

## 2018-08-26 DIAGNOSIS — I255 Ischemic cardiomyopathy: Secondary | ICD-10-CM | POA: Diagnosis not present

## 2018-08-26 DIAGNOSIS — Z79899 Other long term (current) drug therapy: Secondary | ICD-10-CM

## 2018-08-26 DIAGNOSIS — Z9114 Patient's other noncompliance with medication regimen: Secondary | ICD-10-CM

## 2018-08-26 DIAGNOSIS — I240 Acute coronary thrombosis not resulting in myocardial infarction: Secondary | ICD-10-CM

## 2018-08-26 LAB — POCT INR: INR: 1.1 — AB (ref 2.0–3.0)

## 2018-08-26 MED ORDER — DIGOXIN 125 MCG PO TABS: 0.1250 mg | ORAL_TABLET | Freq: Every day | ORAL | 3 refills | Status: DC

## 2018-08-26 MED ORDER — LOSARTAN POTASSIUM 25 MG PO TABS: 12.5000 mg | ORAL_TABLET | Freq: Every day | ORAL | 3 refills | Status: DC

## 2018-08-26 MED ORDER — FUROSEMIDE 40 MG PO TABS: 20.0000 mg | ORAL_TABLET | Freq: Every day | ORAL | 3 refills | Status: DC

## 2018-08-26 MED ORDER — METOPROLOL SUCCINATE ER 25 MG PO TB24: 12.5000 mg | ORAL_TABLET | Freq: Every day | ORAL | 3 refills | Status: DC

## 2018-08-26 MED ORDER — ATORVASTATIN CALCIUM 80 MG PO TABS: 80.0000 mg | ORAL_TABLET | Freq: Every day | ORAL | 3 refills | Status: AC

## 2018-08-26 MED ORDER — SPIRONOLACTONE 25 MG PO TABS: 12.5000 mg | ORAL_TABLET | Freq: Every day | ORAL | 3 refills | Status: DC

## 2018-08-26 NOTE — Patient Instructions (Signed)
Description   Take 1.5 tablets for 2 days then take 1 tablet daily, repeat INR in 1 week

## 2018-08-26 NOTE — Patient Instructions (Signed)
Medication Instructions:  STOP- Carvedilol START- Toprol 12.5 mg daily  If you need a refill on your cardiac medications before your next appointment, please call your pharmacy.  Labwork: BMP Today HERE IN OUR OFFICE AT [facility]  Take the provided lab slips with you to the lab for your blood draw.   You will NOT need to fast   Testing/Procedures: None Ordered  Follow-Up: Your physician wants you to follow-up in: 3 Months with Dr Allyson SabalBerry.      Thank you for choosing [facility] at [facility]!!

## 2018-08-27 LAB — BASIC METABOLIC PANEL
BUN/Creatinine Ratio: 16 (ref 9–20)
BUN: 17 mg/dL (ref 6–24)
CALCIUM: 9.3 mg/dL (ref 8.7–10.2)
CHLORIDE: 102 mmol/L (ref 96–106)
CO2: 21 mmol/L (ref 20–29)
Creatinine, Ser: 1.04 mg/dL (ref 0.76–1.27)
GFR calc Af Amer: 90 mL/min/{1.73_m2} (ref >59)
GFR calc non Af Amer: 78 mL/min/{1.73_m2} (ref >59)
GLUCOSE: 63 mg/dL — AB (ref 65–99)
Potassium: 4.5 mmol/L (ref 3.5–5.2)
Sodium: 141 mmol/L (ref 134–144)

## 2018-08-29 ENCOUNTER — Other Ambulatory Visit (INDEPENDENT_AMBULATORY_CARE_PROVIDER_SITE_OTHER): Payer: Self-pay

## 2018-08-29 ENCOUNTER — Other Ambulatory Visit: Payer: Self-pay | Admitting: Pharmacist Clinician (PhC)/ Clinical Pharmacy Specialist

## 2018-08-29 ENCOUNTER — Telehealth: Payer: Self-pay | Admitting: Physician Assistant

## 2018-08-29 ENCOUNTER — Telehealth (INDEPENDENT_AMBULATORY_CARE_PROVIDER_SITE_OTHER): Payer: Self-pay | Admitting: Orthopaedic Surgery

## 2018-08-29 MED ORDER — WARFARIN SODIUM 5 MG PO TABS: ORAL_TABLET | ORAL | 0 refills | Status: DC

## 2018-08-29 MED ORDER — TRAMADOL HCL 50 MG PO TABS: 50.0000 mg | ORAL_TABLET | Freq: Two times a day (BID) | ORAL | 0 refills | Status: DC | PRN

## 2018-08-29 NOTE — Telephone Encounter (Signed)
Okay per Dr Roda Shutters. Printed Rx for patient and handed it to him

## 2018-08-29 NOTE — Telephone Encounter (Signed)
Patient walked in to request a rx refill on Tramadol. He would like this sent to [facility] on [address]. Please call once called in # [phone]

## 2018-08-29 NOTE — Telephone Encounter (Signed)
Note not needed 

## 2018-09-10 ENCOUNTER — Telehealth: Payer: Self-pay | Admitting: Pharmacist

## 2018-09-10 ENCOUNTER — Telehealth: Payer: Self-pay

## 2018-09-10 NOTE — Telephone Encounter (Signed)
Med list faxed to [facility] to Mitzie Na NP [phone] with pt verbal approval.. He was there being seen for primary care.

## 2018-09-10 NOTE — Telephone Encounter (Signed)
LMOM; patient to call back and schedule coumadin clinic visit

## 2018-09-12 NOTE — Telephone Encounter (Signed)
Talked to sister. Patient to call us back and schedule f/u INR ASAP.

## 2018-09-24 ENCOUNTER — Telehealth (INDEPENDENT_AMBULATORY_CARE_PROVIDER_SITE_OTHER): Payer: Self-pay | Admitting: Orthopaedic Surgery

## 2018-09-24 NOTE — Telephone Encounter (Signed)
Medication refill   Tramadol(Ultram)50mg  tablet

## 2018-09-24 NOTE — Telephone Encounter (Signed)
Please advise if ok to rf? 

## 2018-09-25 ENCOUNTER — Other Ambulatory Visit (INDEPENDENT_AMBULATORY_CARE_PROVIDER_SITE_OTHER): Payer: Self-pay

## 2018-09-25 MED ORDER — TRAMADOL HCL 50 MG PO TABS: 50.0000 mg | ORAL_TABLET | Freq: Two times a day (BID) | ORAL | 0 refills | Status: DC | PRN

## 2018-09-25 NOTE — Telephone Encounter (Signed)
Check with xu

## 2018-09-25 NOTE — Telephone Encounter (Signed)
Called in [facility] [place]  [address] since last Rx was sent there. Called patient no answer LMOM to let him know this.

## 2018-09-25 NOTE — Telephone Encounter (Signed)
yes

## 2018-09-25 NOTE — Telephone Encounter (Signed)
Please advise 

## 2018-09-25 NOTE — Telephone Encounter (Signed)
xu patient 

## 2018-09-30 NOTE — Telephone Encounter (Signed)
Called patient to advise no answer. LMOM.    

## 2018-09-30 NOTE — Telephone Encounter (Signed)
Called into pharm  

## 2018-09-30 NOTE — Telephone Encounter (Signed)
Patient came into the office he needs prescription sent to [facility] not [facility].

## 2018-11-04 ENCOUNTER — Ambulatory Visit: Payer: Self-pay

## 2018-11-04 DIAGNOSIS — I513 Intracardiac thrombosis, not elsewhere classified: Principal | ICD-10-CM

## 2018-11-04 DIAGNOSIS — I240 Acute coronary thrombosis not resulting in myocardial infarction: Secondary | ICD-10-CM

## 2018-11-04 NOTE — Progress Notes (Signed)
Scheduled pt labs after appt with pcp

## 2018-11-20 ENCOUNTER — Ambulatory Visit: Payer: Medicaid Other | Admitting: Cardiovascular Disease

## 2018-11-29 ENCOUNTER — Encounter: Payer: Self-pay | Admitting: *Deleted

## 2018-12-18 ENCOUNTER — Telehealth: Payer: Self-pay

## 2018-12-18 NOTE — Telephone Encounter (Signed)
Called to reschedule appt overdue inr pt compliant to come 1/16 @330pm 

## 2018-12-19 ENCOUNTER — Ambulatory Visit (INDEPENDENT_AMBULATORY_CARE_PROVIDER_SITE_OTHER): Payer: Medicaid Other | Admitting: Pharmacist

## 2018-12-19 DIAGNOSIS — I513 Intracardiac thrombosis, not elsewhere classified: Secondary | ICD-10-CM | POA: Diagnosis not present

## 2018-12-19 DIAGNOSIS — Z7901 Long term (current) use of anticoagulants: Secondary | ICD-10-CM

## 2018-12-19 DIAGNOSIS — I240 Acute coronary thrombosis not resulting in myocardial infarction: Secondary | ICD-10-CM

## 2018-12-19 LAB — POCT INR: INR: 1.1 — AB (ref 2.0–3.0)

## 2018-12-19 MED ORDER — WARFARIN SODIUM 5 MG PO TABS: 5.0000 mg | ORAL_TABLET | Freq: Every day | ORAL | 0 refills | Status: DC

## 2019-01-08 ENCOUNTER — Ambulatory Visit: Payer: Medicaid Other | Admitting: Cardiovascular Disease

## 2019-01-09 ENCOUNTER — Encounter: Payer: Self-pay | Admitting: *Deleted

## 2019-01-14 ENCOUNTER — Telehealth: Payer: Self-pay

## 2019-01-14 NOTE — Telephone Encounter (Signed)
Called left msg overdue inr 

## 2019-02-05 ENCOUNTER — Telehealth: Payer: Self-pay

## 2019-02-05 NOTE — Telephone Encounter (Signed)
Called left msg regarding overdue coumadin appt

## 2019-02-09 IMAGING — MR MR CARD MORPHOLOGY WO/W CM
11 of 12 series · 38 of 40 positions shown · IV contrast (20    MH)
Comparison: none

CLINICAL DATA: Ischemic cardiomyopathy, assess for viability.

EXAM:
CARDIAC MRI
TECHNIQUE: The patient was scanned on a 1.5 Tesla GE magnet. A dedicated
cardiac coil was used. Functional imaging was done using Fiesta
sequences. [DATE], and 4 chamber views were done to assess for RWMA's.
Modified Caro rule using a short axis stack was used to
calculate an ejection fraction on a dedicated work station using
Circle software. The patient received 30 cc of Multihance. After 10
minutes inversion recovery sequences were used to assess for
infiltration and scar tissue.

[Series 3: bSSFP · sagittal · 8.0mm · 1.21mm/px · 1 of 14 slices shown (1 of 6)]
[im 1/14]
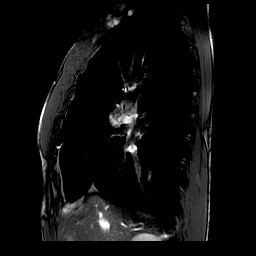

[Series 5: bSSFP · coronal · 8.0mm · 1.48mm/px · 1 of 5 slices shown (2 of 6)]
[im 1/5]
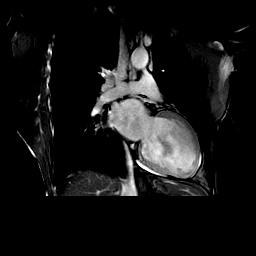

[Series 7: bSSFP · oblique · 8.0mm · 1.37mm/px · 1 of 20 slices shown (3 of 6)]
[im 1/20]
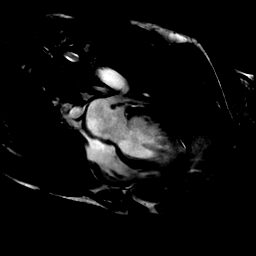

[Series 8: bSSFP · oblique · 8.0mm · 1.37mm/px · 19 of 300 slices shown (4 of 6)]
[im 1/300]
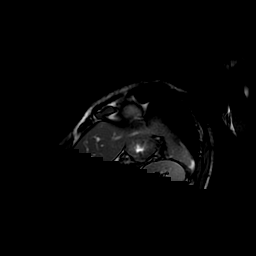
[im 17/300]
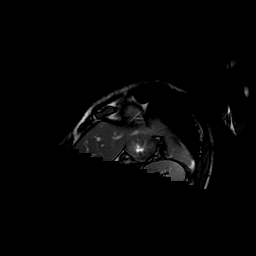
[im 34/300]
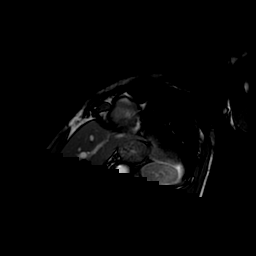
[im 50/300]
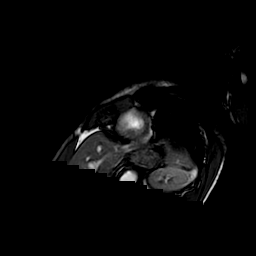
[im 67/300]
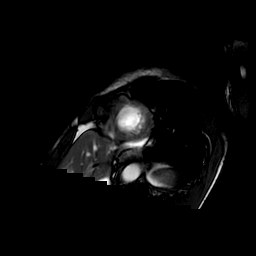
[im 84/300]
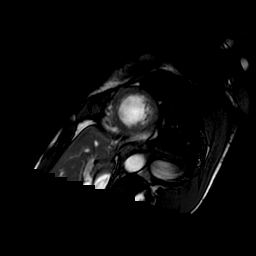
[im 100/300]
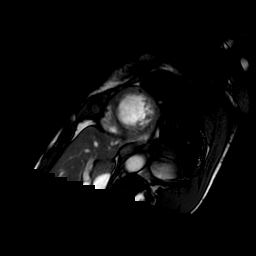
[im 117/300]
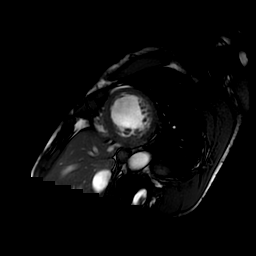
[im 133/300]
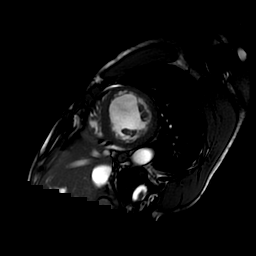
[im 150/300]
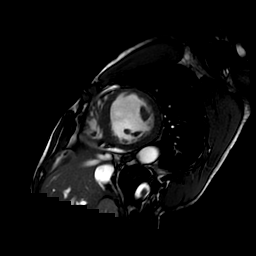
[im 167/300]
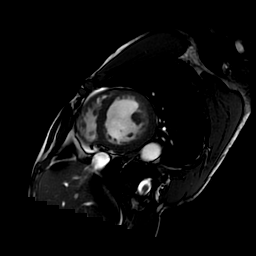
[im 183/300]
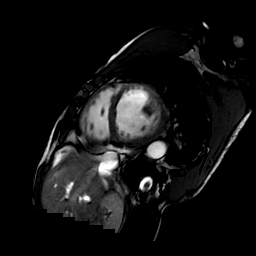
[im 200/300]
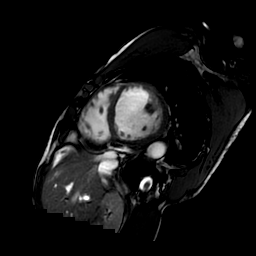
[im 216/300]
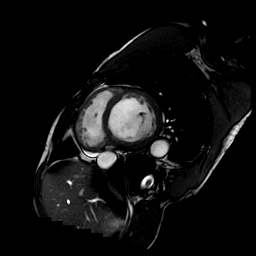
[im 233/300]
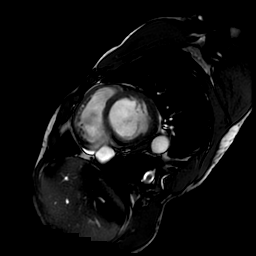
[im 250/300]
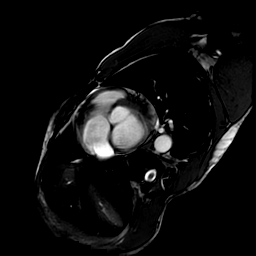
[im 266/300]
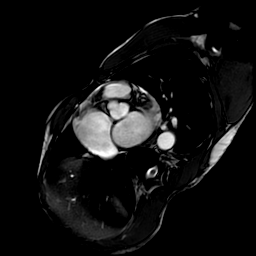
[im 283/300]
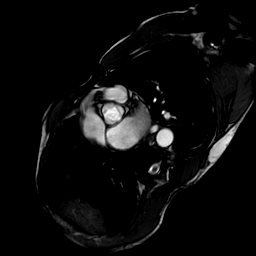
[im 300/300]
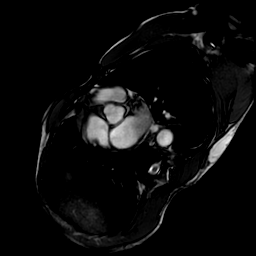

[Series 10: qpqs (id) · axial · 8.0mm · 1.48mm/px · z∈[-10,-10]mm · 3 of 60 slices shown]
[im 1/60]
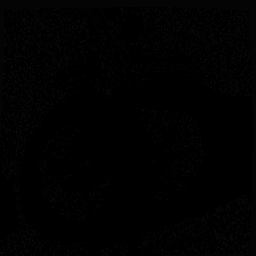
[im 30/60]
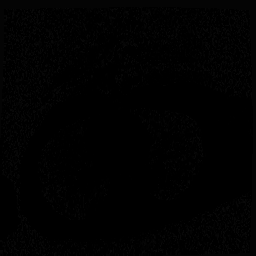
[im 60/60]
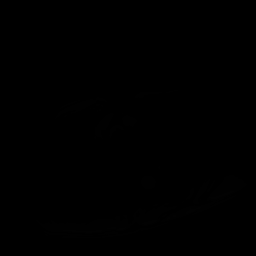

[Series 11: bSSFP · oblique · 8.0mm · 1.41mm/px · 3 of 60 slices shown (5 of 6)]
[im 1/60]
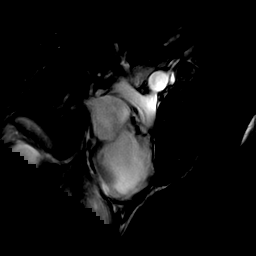
[im 30/60]
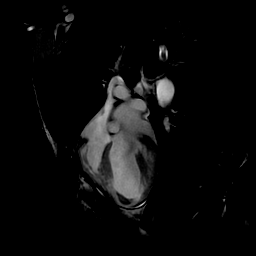
[im 60/60]
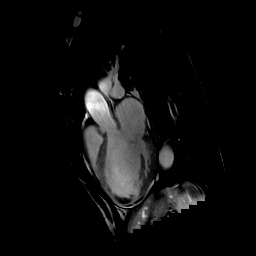

[Series 12: bSSFP · sagittal · 8.0mm · 1.41mm/px · 6 of 100 slices shown (6 of 6)]
[im 1/100]
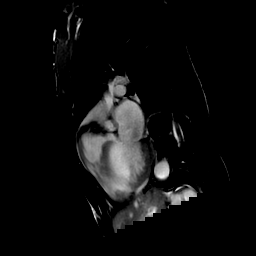
[im 20/100]
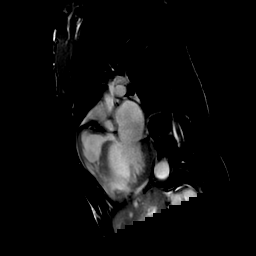
[im 40/100]
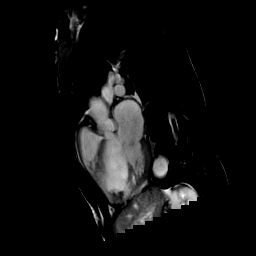
[im 60/100]
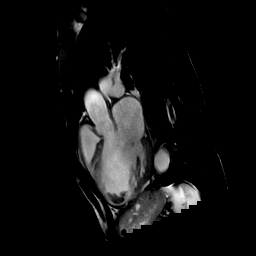
[im 80/100]
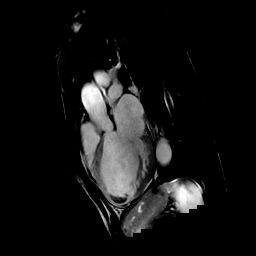
[im 100/100]
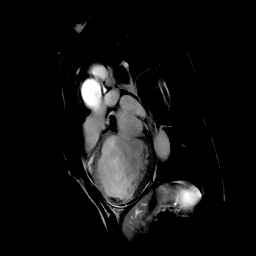

[Series 22: delayed ir prep · oblique · 8.0mm · 1.37mm/px · 1 of 4 slices shown (1 of 2)]
[im 1/4]
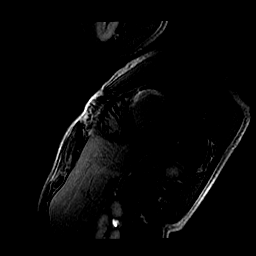

[Series 23: delayed ir prep · oblique · 8.0mm · 1.37mm/px · 1 of 7 slices shown (2 of 2)]
[im 1/7]
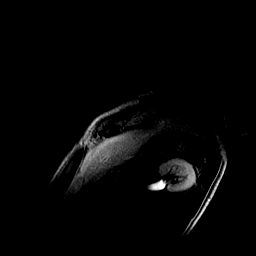

[Series 27: lvot mde · sagittal · 8.0mm · 1.41mm/px · 1 of 5 slices shown]
[im 1/5]
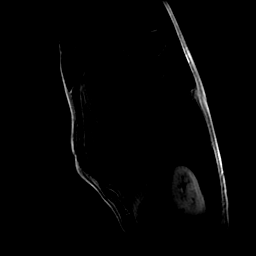

[Series 28: lvot long ti · sagittal · 8.0mm · 1.41mm/px · 1 of 5 slices shown]
[im 1/5]
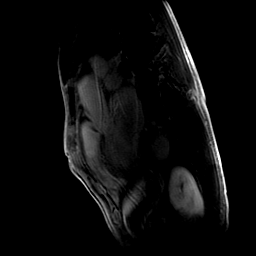

[38 of 40 positions shown; findings below may reference images not displayed]

FINDINGS: Limited images of the lung fields showed no significant
abnormalities.

Mild to moderate left ventricular dilation with normal wall
thickness. There was a layered apical thrombus. LV EF 20%. The
anteroseptal wall and the anterior wall were akinetic. The true
apex, the apical lateral wall, and the apical inferior wall were
akinetic. The basal to mid anterolateral wall was moderately
hypokinetic. Normal right ventricular size with mildly decreased
systolic function. Mild left atrial enlargement. Normal right
atrium. Trileaflet aortic vavle with no stenosis or regurgitation.
Mild mitral regurgitation.

Delayed enhancement:

76-99% wall thickness subendocardial late gadolinium enhancement
(LGE) in the mid to apical anteroseptal and anterior walls, the
apical inferior wall, the apical lateral wall, and the true apex.

Measurements:

LVEDV 229 mL

LV SV 45 mL

LV EF 20%
IMPRESSION: 1. Mild to moderate LV dilation with normal wall thickness. EF 20%
with wall motion abnormalities as noted above. There was a layered
LV apical thrombus.

2.  Normal RV size with mildly decreased systolic function.

3. Delayed enhancement images show significant scarring in the LAD
territory. I suspect that the mid to apical anterior/anteroseptal
walls, the apical inferior wall, the apical lateral wall, and the
true apex are not viable.

Shirley Konate

## 2019-02-12 ENCOUNTER — Telehealth: Payer: Self-pay

## 2019-02-12 NOTE — Telephone Encounter (Signed)
Made the 4th attempt to call the pt left msg for overdue inr

## 2019-02-14 ENCOUNTER — Ambulatory Visit (INDEPENDENT_AMBULATORY_CARE_PROVIDER_SITE_OTHER): Payer: Medicaid Other | Admitting: *Deleted

## 2019-02-14 ENCOUNTER — Other Ambulatory Visit: Payer: Self-pay

## 2019-02-14 DIAGNOSIS — I240 Acute coronary thrombosis not resulting in myocardial infarction: Secondary | ICD-10-CM

## 2019-02-14 DIAGNOSIS — I513 Intracardiac thrombosis, not elsewhere classified: Secondary | ICD-10-CM | POA: Diagnosis not present

## 2019-02-14 DIAGNOSIS — Z5181 Encounter for therapeutic drug level monitoring: Secondary | ICD-10-CM | POA: Diagnosis not present

## 2019-02-14 LAB — POCT INR: INR: 1 — AB (ref 2.0–3.0)

## 2019-02-14 MED ORDER — WARFARIN SODIUM 5 MG PO TABS: 5.0000 mg | ORAL_TABLET | Freq: Every day | ORAL | 0 refills | Status: DC

## 2019-02-14 NOTE — Patient Instructions (Signed)
Description   Today and tomorrow take 1.5 tablets, then continue taking  1 tablet daily, repeat INR in 7-10 days.

## 2019-02-21 ENCOUNTER — Telehealth: Payer: Self-pay | Admitting: *Deleted

## 2019-02-21 NOTE — Telephone Encounter (Signed)

## 2019-02-24 ENCOUNTER — Ambulatory Visit (INDEPENDENT_AMBULATORY_CARE_PROVIDER_SITE_OTHER): Payer: Medicaid Other | Admitting: *Deleted

## 2019-02-24 ENCOUNTER — Other Ambulatory Visit: Payer: Self-pay

## 2019-02-24 DIAGNOSIS — Z5181 Encounter for therapeutic drug level monitoring: Secondary | ICD-10-CM

## 2019-02-24 DIAGNOSIS — I513 Intracardiac thrombosis, not elsewhere classified: Secondary | ICD-10-CM

## 2019-02-24 DIAGNOSIS — I240 Acute coronary thrombosis not resulting in myocardial infarction: Secondary | ICD-10-CM

## 2019-02-24 LAB — POCT INR: INR: 1.3 — AB (ref 2.0–3.0)

## 2019-02-24 NOTE — Patient Instructions (Signed)
Description   Today and tomorrow take 1.5 tablets, then start taking 1 tablet daily except 1.5 tablets on Sunday, Tuesday, and Thursday. Recheck INR in 1 week.

## 2019-02-28 ENCOUNTER — Telehealth: Payer: Self-pay

## 2019-02-28 NOTE — Telephone Encounter (Signed)

## 2019-03-03 ENCOUNTER — Other Ambulatory Visit: Payer: Self-pay

## 2019-03-03 ENCOUNTER — Telehealth: Payer: Self-pay

## 2019-03-03 ENCOUNTER — Ambulatory Visit (INDEPENDENT_AMBULATORY_CARE_PROVIDER_SITE_OTHER): Payer: Medicaid Other | Admitting: Pharmacist

## 2019-03-03 DIAGNOSIS — I513 Intracardiac thrombosis, not elsewhere classified: Secondary | ICD-10-CM | POA: Diagnosis not present

## 2019-03-03 DIAGNOSIS — Z5181 Encounter for therapeutic drug level monitoring: Secondary | ICD-10-CM

## 2019-03-03 DIAGNOSIS — I240 Acute coronary thrombosis not resulting in myocardial infarction: Secondary | ICD-10-CM

## 2019-03-03 LAB — POCT INR: INR: 2.5 (ref 2.0–3.0)

## 2019-03-03 NOTE — Telephone Encounter (Addendum)
Called patient back I went over the cardiac and the COVID-19 screening questions. Based off of patient's Cardiologists suggestions patient's appointment was cancelled and a recall was entered for 3 months with an APP and 6 months with Allyson Sabal. Explained to patient that if he begins to experience new or worsening cardiac symptoms to call 911 and our office. Patient verbalized an understanding.        Cardiac Questionnaire:    Since your last visit or hospitalization:    1. Have you been having new or worsening chest pain? No    2. Have you been having new or worsening shortness of breath? No 3. Have you been having new or worsening leg swelling, wt gain, or increase in abdominal girth (pants fitting more tightly)? No   4. Have you had any passing out spells? No    *A YES to any of these questions would result in the appointment being kept. *If all the answers to these questions are NO, we should indicate that given the current situation regarding the worldwide coronarvirus pandemic, at the recommendation of the CDC, we are looking to limit gatherings in our waiting area, and thus will reschedule their appointment beyond four weeks from today.   _____________   COVID-19 Pre-Screening Questions:  . Do you currently have a fever? No (yes = cancel and refer to pcp for e-visit) . Have you recently travelled on a cruise, internationally, or to [place], [place], [place], [place], [place], or [place], [place] [employer]) ? No  (yes = cancel, stay home, monitor symptoms, and contact pcp or initiate e-visit if symptoms develop) . Have you been in contact with someone that is currently pending confirmation of Covid19 testing or has been confirmed to have the Covid19 virus?  No (yes = cancel, stay home, away from tested individual, monitor symptoms, and contact pcp or initiate e-visit if symptoms develop) . Are you currently experiencing fatigue or cough? No (yes = pt should be prepared to have a mask placed at the time of their  visit).

## 2019-03-03 NOTE — Telephone Encounter (Signed)
Left voice message for the patient to call back for reschedule is appointment for 03-04-2019 to a later date

## 2019-03-04 ENCOUNTER — Ambulatory Visit: Payer: Medicaid Other | Admitting: Cardiovascular Disease

## 2019-03-14 ENCOUNTER — Other Ambulatory Visit: Payer: Self-pay | Admitting: Cardiovascular Disease

## 2019-03-14 ENCOUNTER — Telehealth: Payer: Self-pay

## 2019-03-14 NOTE — Telephone Encounter (Signed)

## 2019-03-17 ENCOUNTER — Ambulatory Visit (INDEPENDENT_AMBULATORY_CARE_PROVIDER_SITE_OTHER): Payer: Medicaid Other | Admitting: *Deleted

## 2019-03-17 ENCOUNTER — Other Ambulatory Visit: Payer: Self-pay

## 2019-03-17 DIAGNOSIS — I513 Intracardiac thrombosis, not elsewhere classified: Secondary | ICD-10-CM | POA: Diagnosis not present

## 2019-03-17 DIAGNOSIS — Z5181 Encounter for therapeutic drug level monitoring: Secondary | ICD-10-CM

## 2019-03-17 DIAGNOSIS — I240 Acute coronary thrombosis not resulting in myocardial infarction: Secondary | ICD-10-CM

## 2019-03-17 LAB — POCT INR: INR: 1.7 — AB (ref 2.0–3.0)

## 2019-03-28 ENCOUNTER — Telehealth: Payer: Self-pay

## 2019-03-28 NOTE — Telephone Encounter (Signed)

## 2019-03-31 ENCOUNTER — Ambulatory Visit (INDEPENDENT_AMBULATORY_CARE_PROVIDER_SITE_OTHER): Payer: Medicaid Other | Admitting: *Deleted

## 2019-03-31 ENCOUNTER — Other Ambulatory Visit: Payer: Self-pay

## 2019-03-31 DIAGNOSIS — I240 Acute coronary thrombosis not resulting in myocardial infarction: Secondary | ICD-10-CM

## 2019-03-31 DIAGNOSIS — I513 Intracardiac thrombosis, not elsewhere classified: Secondary | ICD-10-CM

## 2019-03-31 DIAGNOSIS — Z5181 Encounter for therapeutic drug level monitoring: Secondary | ICD-10-CM

## 2019-03-31 LAB — POCT INR: INR: 3.9 — AB (ref 2.0–3.0)

## 2019-04-11 ENCOUNTER — Telehealth: Payer: Self-pay

## 2019-04-11 ENCOUNTER — Telehealth: Payer: Self-pay | Admitting: *Deleted

## 2019-04-11 NOTE — Telephone Encounter (Signed)
lmom for prescreen  

## 2019-04-11 NOTE — Telephone Encounter (Signed)

## 2019-04-16 ENCOUNTER — Other Ambulatory Visit: Payer: Self-pay | Admitting: Cardiovascular Disease

## 2019-05-21 ENCOUNTER — Emergency Department (HOSPITAL_COMMUNITY): Payer: Medicaid Other

## 2019-05-21 ENCOUNTER — Encounter (HOSPITAL_COMMUNITY): Payer: Self-pay | Admitting: Emergency Medicine

## 2019-05-21 ENCOUNTER — Emergency Department (HOSPITAL_COMMUNITY)
Admission: EM | Admit: 2019-05-21 | Discharge: 2019-05-21 | Disposition: A | Discharge status: Home / Self Care | Payer: Medicaid Other | Attending: Emergency Medicine | Admitting: Emergency Medicine

## 2019-05-21 ENCOUNTER — Other Ambulatory Visit: Payer: Self-pay

## 2019-05-21 DIAGNOSIS — Y939 Activity, unspecified: Secondary | ICD-10-CM | POA: Diagnosis not present

## 2019-05-21 DIAGNOSIS — F319 Bipolar disorder, unspecified: Secondary | ICD-10-CM | POA: Insufficient documentation

## 2019-05-21 DIAGNOSIS — I10 Essential (primary) hypertension: Secondary | ICD-10-CM | POA: Diagnosis not present

## 2019-05-21 DIAGNOSIS — S39012A Strain of muscle, fascia and tendon of lower back, initial encounter: Secondary | ICD-10-CM | POA: Diagnosis not present

## 2019-05-21 DIAGNOSIS — F17210 Nicotine dependence, cigarettes, uncomplicated: Secondary | ICD-10-CM | POA: Diagnosis not present

## 2019-05-21 DIAGNOSIS — I509 Heart failure, unspecified: Secondary | ICD-10-CM | POA: Diagnosis not present

## 2019-05-21 DIAGNOSIS — M545 Low back pain: Secondary | ICD-10-CM | POA: Diagnosis present

## 2019-05-21 DIAGNOSIS — Z79899 Other long term (current) drug therapy: Secondary | ICD-10-CM | POA: Diagnosis not present

## 2019-05-21 DIAGNOSIS — Z7982 Long term (current) use of aspirin: Secondary | ICD-10-CM | POA: Diagnosis not present

## 2019-05-21 DIAGNOSIS — Y999 Unspecified external cause status: Secondary | ICD-10-CM | POA: Insufficient documentation

## 2019-05-21 DIAGNOSIS — Y929 Unspecified place or not applicable: Secondary | ICD-10-CM | POA: Diagnosis not present

## 2019-05-21 DIAGNOSIS — Z7901 Long term (current) use of anticoagulants: Secondary | ICD-10-CM | POA: Diagnosis not present

## 2019-05-21 MED ORDER — IBUPROFEN 800 MG PO TABS
800.0000 mg | ORAL_TABLET | Freq: Once | ORAL | Status: AC
  Administered 2019-05-21: 800 mg via ORAL
  Filled 2019-05-21: qty 1

## 2019-05-21 MED ORDER — IBUPROFEN 800 MG PO TABS: 800.0000 mg | ORAL_TABLET | Freq: Three times a day (TID) | ORAL | 0 refills | Status: AC | PRN

## 2019-05-21 MED ORDER — IBUPROFEN 800 MG PO TABS: 800.0000 mg | ORAL_TABLET | Freq: Three times a day (TID) | ORAL | 0 refills | Status: DC | PRN

## 2019-05-21 MED ORDER — OXYCODONE-ACETAMINOPHEN 5-325 MG PO TABS
1.0000 | ORAL_TABLET | Freq: Once | ORAL | Status: AC
  Administered 2019-05-21: 1 via ORAL
  Filled 2019-05-21: qty 1
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
## 2019-06-03 ENCOUNTER — Other Ambulatory Visit: Payer: Self-pay | Admitting: Cardiovascular Disease

## 2019-06-27 ENCOUNTER — Telehealth: Payer: Self-pay

## 2019-06-27 ENCOUNTER — Other Ambulatory Visit: Payer: Self-pay

## 2019-06-27 MED ORDER — WARFARIN SODIUM 5 MG PO TABS: ORAL_TABLET | ORAL | 0 refills | Status: DC

## 2019-06-27 NOTE — Telephone Encounter (Signed)
Called and left a message for the patient informing him to give our office a call back to go cover [facility] prescreen questions.

## 2019-06-27 NOTE — Telephone Encounter (Signed)
    COVID-19 Pre-Screening Questions:  . In the past 7 to 10 days have you had a cough,  shortness of breath, headache, congestion, fever (100 or greater) body aches, chills, sore throat, or sudden loss of taste or sense of smell? NO . Have you been around anyone with known Covid 19. NO . Have you been around anyone who is awaiting Covid 19 test results in the past 7 to 10 days? NO . Have you been around anyone who has been exposed to Covid 19, or has mentioned symptoms of Covid 19 within the past 7 to 10 days? NO  If you have any concerns/questions about symptoms patients report during screening (either on the phone or at threshold). Contact the provider seeing the patient or DOD for further guidance.  If neither are available contact a member of the leadership team.     Patient was advised of visitor restrictions (no visitors allowed except if needed to conduct the visit). Also advised to arrive at appointment time and wear a mask if he/she does not have a mask one will be provided upon entrance. Patient verbalized understanding and all (if any) questions were answered        

## 2019-06-27 NOTE — Telephone Encounter (Signed)
While on the telephone with the patient he mentioned he was out of his blood thinner medication I informed him that I will route the refill request.

## 2019-06-30 ENCOUNTER — Other Ambulatory Visit: Payer: Self-pay

## 2019-06-30 ENCOUNTER — Ambulatory Visit (INDEPENDENT_AMBULATORY_CARE_PROVIDER_SITE_OTHER): Payer: Medicaid Other | Admitting: *Deleted

## 2019-06-30 DIAGNOSIS — I240 Acute coronary thrombosis not resulting in myocardial infarction: Secondary | ICD-10-CM

## 2019-06-30 DIAGNOSIS — Z5181 Encounter for therapeutic drug level monitoring: Secondary | ICD-10-CM | POA: Diagnosis not present

## 2019-06-30 DIAGNOSIS — I513 Intracardiac thrombosis, not elsewhere classified: Secondary | ICD-10-CM

## 2019-06-30 LAB — POCT INR: INR: 1.1 — AB (ref 2.0–3.0)

## 2019-06-30 MED ORDER — WARFARIN SODIUM 5 MG PO TABS: ORAL_TABLET | ORAL | 1 refills | Status: DC

## 2019-06-30 NOTE — Patient Instructions (Signed)
Description   Pt has already taken 1.5 tablets today, resume  taking 1 tablet daily except 1.5 tablets on Sunday, Tuesday, and Thursday. Recheck INR in 1 week.

## 2019-07-11 ENCOUNTER — Ambulatory Visit (INDEPENDENT_AMBULATORY_CARE_PROVIDER_SITE_OTHER): Payer: Medicaid Other | Admitting: Cardiovascular Disease

## 2019-07-11 ENCOUNTER — Other Ambulatory Visit: Payer: Self-pay

## 2019-07-11 ENCOUNTER — Encounter: Payer: Self-pay | Admitting: Cardiovascular Disease

## 2019-07-11 ENCOUNTER — Ambulatory Visit (INDEPENDENT_AMBULATORY_CARE_PROVIDER_SITE_OTHER): Payer: Medicaid Other | Admitting: Pharmacist Clinician (PhC)/ Clinical Pharmacy Specialist

## 2019-07-11 DIAGNOSIS — I10 Essential (primary) hypertension: Secondary | ICD-10-CM

## 2019-07-11 DIAGNOSIS — E785 Hyperlipidemia, unspecified: Secondary | ICD-10-CM | POA: Insufficient documentation

## 2019-07-11 DIAGNOSIS — I240 Acute coronary thrombosis not resulting in myocardial infarction: Secondary | ICD-10-CM

## 2019-07-11 DIAGNOSIS — Z5181 Encounter for therapeutic drug level monitoring: Secondary | ICD-10-CM

## 2019-07-11 DIAGNOSIS — I2510 Atherosclerotic heart disease of native coronary artery without angina pectoris: Secondary | ICD-10-CM | POA: Diagnosis not present

## 2019-07-11 DIAGNOSIS — I255 Ischemic cardiomyopathy: Secondary | ICD-10-CM | POA: Diagnosis not present

## 2019-07-11 DIAGNOSIS — I513 Intracardiac thrombosis, not elsewhere classified: Secondary | ICD-10-CM

## 2019-07-11 DIAGNOSIS — E782 Mixed hyperlipidemia: Secondary | ICD-10-CM

## 2019-07-11 LAB — POCT INR: INR: 1.3 — AB (ref 2.0–3.0)

## 2019-07-11 NOTE — Assessment & Plan Note (Signed)
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  Has been on Coumadin anticoagulation since with variable INRs.  I am going to recheck a 2D echo with Definity to assess LV mural thrombus

## 2019-07-11 NOTE — Assessment & Plan Note (Signed)
History of hyperlipidemia on atorvastatin which he takes sporadically.  His most recent lipid profile performed 06/25/2018 showed an LDL of 132.  We will recheck a lipid liver profile.

## 2019-07-11 NOTE — Patient Instructions (Signed)
Take another 1 tablet today Friday Aug 7 then 2 tablets on Saturday Aug 8, then continue taking 1 tablet daily except 1.5 tablets on Sunday, Tuesday, and Thursday. Recheck INR in 1 week.

## 2019-07-11 NOTE — Assessment & Plan Note (Signed)
History of ischemic cardiomyopathy with an EF of 25% range on appropriate pharmacology.  He gets occasional shortness of breath but denies chest pain.  We will recheck a 2D echo for LV function.

## 2019-07-11 NOTE — Assessment & Plan Note (Signed)
History of CAD status post right and left heart cath by Dr. Saunders Revel 02/14/2018 revealing a subtotally occluded ostial LAD with severe LV dysfunction.  Subsequent viability study showed that the anterior wall is scarred.  He denies chest pain.

## 2019-07-11 NOTE — Assessment & Plan Note (Signed)
History of essential hypertension with blood pressure measured at 110/64.  He is on losartan, metoprolol and spironolactone.

## 2019-07-11 NOTE — Patient Instructions (Signed)
Medication Instructions:  Your physician recommends that you continue on your current medications as directed. Please refer to the Current Medication list given to you today.  If you need a refill on your cardiac medications before your next appointment, please call your pharmacy.   Lab work: Your physician recommends that you return for lab work WITHIN 1 WEEK: FASTING LIPID PROFILE AND LIVER FUNCTION TEST If you have labs (blood work) drawn today and your tests are completely normal, you will receive your results only by: Marland Kitchen MyChart Message (if you have MyChart) OR . A paper copy in the mail If you have any lab test that is abnormal or we need to change your treatment, we will call you to review the results.  Testing/Procedures: Your physician has requested that you have an echocardiogram. Echocardiography is a painless test that uses sound waves to create images of your heart. It provides your doctor with information about the size and shape of your heart and how well your heart's chambers and valves are working. This procedure takes approximately one hour. There are no restrictions for this procedure. LOCATION: [facility]: [address], [place], [place] [address]   Follow-Up: At [facility], you and your health needs are our priority.  As part of our continuing mission to provide you with exceptional heart care, we have created designated Provider Care Teams.  These Care Teams include your primary Cardiologist (physician) and Advanced Practice Providers (APPs -  Physician Assistants and Nurse Practitioners) who all work together to provide you with the care you need, when you need it. You will need a follow up appointment in 12 months WITH DR. Gwenlyn Found.  Please call our office 2 months in advance to schedule this appointment.

## 2019-07-11 NOTE — Progress Notes (Signed)
07/11/2019 Ricardo Brooks   [birth year]  [id number]  Primary Physician [place], Duke Salviaichard M, MD Primary Cardiologist: Runell GessJonathan J Tait Balistreri MD Nicholes CalamityFACP, FACC, FAHA, [place]  HPI:  Ricardo Brooks is a 63 y.o. thin appearing divorced African-American male father of 3 children, grandfather of one grandchild who currently does not work.  I last saw him in the hospital 06/26/2018 after he was admitted and was found to have an LV mural thrombus.  He has a history of tobacco abuse, hyperlipidemia and ischemic cardiomyopathy.  He had a right left heart cath by Dr. Okey DupreEnd 02/14/2018 revealing subtotally occluded ostial LAD with TIMI I flow by directional collaterals.  Subsequent viability study showed a scarred anterior wall and anterior mural thrombus.  He has been on Coumadin anticoagulation with variable INRs.   Current Meds  Medication Sig  . aspirin EC 81 MG EC tablet Take 1 tablet (81 mg total) by mouth daily.  Marland Kitchen. atorvastatin (LIPITOR) 80 MG tablet Take 1 tablet (80 mg total) by mouth daily at 6 PM.  . digoxin (LANOXIN) 0.125 MG tablet Take 1 tablet (0.125 mg total) by mouth daily.  . furosemide (LASIX) 40 MG tablet Take 0.5 tablets (20 mg total) by mouth daily.  Marland Kitchen. ibuprofen (ADVIL) 800 MG tablet Take 1 tablet (800 mg total) by mouth every 8 (eight) hours as needed for mild pain.  Marland Kitchen. losartan (COZAAR) 25 MG tablet Take 0.5 tablets (12.5 mg total) by mouth daily.  . metoprolol succinate (TOPROL XL) 25 MG 24 hr tablet Take 0.5 tablets (12.5 mg total) by mouth daily.  . nitroGLYCERIN (NITROSTAT) 0.4 MG SL tablet Place 1 tablet (0.4 mg total) under the tongue every 5 (five) minutes x 3 doses as needed for chest pain.  Marland Kitchen. spironolactone (ALDACTONE) 25 MG tablet Take 0.5 tablets (12.5 mg total) by mouth at bedtime.  . traMADol (ULTRAM) 50 MG tablet Take 1 tablet (50 mg total) by mouth 2 (two) times daily as needed.  . warfarin (COUMADIN) 5 MG tablet TAKE 1 TO 1 & 1/2 TABLETS BY MOUTH DAILY AS DIRECTED  . [DISCONTINUED]  traMADol (ULTRAM) 50 MG tablet Take 1-2 tablets (50-100 mg total) by mouth 3 (three) times daily as needed. (Patient taking differently: Take 50 mg by mouth 3 (three) times daily as needed for moderate pain. )     Allergies  Allergen Reactions  . Methocarbamol Nausea And Vomiting and Anxiety    Social History   Socioeconomic History  . Marital status: Divorced    Spouse name: Not on file  . Number of children: Not on file  . Years of education: Not on file  . Highest education level: Not on file  Occupational History  . Not on file  Social Needs  . Financial resource strain: Not on file  . Food insecurity    Worry: Not on file    Inability: Not on file  . Transportation needs    Medical: Not on file    Non-medical: Not on file  Tobacco Use  . Smoking status: Current Every Day Smoker    Packs/day: 0.50    Years: 41.00    Pack years: 20.50    Types: Cigarettes  . Smokeless tobacco: Never Used  Substance and Sexual Activity  . Alcohol use: Yes    Alcohol/week: 1.0 standard drinks    Types: 1 Cans of beer per week  . Drug use: Not Currently  . Sexual activity: Yes  Lifestyle  . Physical activity  Days per week: Not on file    Minutes per session: Not on file  . Stress: Not on file  Relationships  . Social Herbalist on phone: Not on file    Gets together: Not on file    Attends religious service: Not on file    Active member of club or organization: Not on file    Attends meetings of clubs or organizations: Not on file    Relationship status: Not on file  . Intimate partner violence    Fear of current or ex partner: Not on file    Emotionally abused: Not on file    Physically abused: Not on file    Forced sexual activity: Not on file  Other Topics Concern  . Not on file  Social History Narrative  . Not on file     Review of Systems: General: negative for chills, fever, night sweats or weight changes.  Cardiovascular: negative for chest pain,  dyspnea on exertion, edema, orthopnea, palpitations, paroxysmal nocturnal dyspnea or shortness of breath Dermatological: negative for rash Respiratory: negative for cough or wheezing Urologic: negative for hematuria Abdominal: negative for nausea, vomiting, diarrhea, bright red blood per rectum, melena, or hematemesis Neurologic: negative for visual changes, syncope, or dizziness All other systems reviewed and are otherwise negative except as noted above.    Blood pressure 110/64, pulse 62, temperature 97.9 F (36.6 C), height 5\' 9"  (1.753 m), weight 140 lb (63.5 kg).  General appearance: alert and no distress Neck: no adenopathy, no carotid bruit, no JVD, supple, symmetrical, trachea midline and thyroid not enlarged, symmetric, no tenderness/mass/nodules Lungs: clear to auscultation bilaterally Heart: regular rate and rhythm, S1, S2 normal, no murmur, click, rub or gallop Extremities: extremities normal, atraumatic, no cyanosis or edema Pulses: 2+ and symmetric Skin: Skin color, texture, turgor normal. No rashes or lesions Neurologic: Alert and oriented X 3, normal strength and tone. Normal symmetric reflexes. Normal coordination and gait  EKG sinus rhythm at 62 with poor R wave progression nonspecific ST-T wave changes with left axis deviation.  I personally reviewed this EKG.   ASSESSMENT AND PLAN:   CAD (coronary artery disease) History of CAD status post right and left heart cath by Dr. Saunders Revel 02/14/2018 revealing a subtotally occluded ostial LAD with severe LV dysfunction.  Subsequent viability study showed that the anterior wall is scarred.  He denies chest pain.  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  Has been on Coumadin anticoagulation since with variable INRs.  I am going to recheck a 2D echo with Definity to assess LV mural thrombus  Hypertension History of essential hypertension with  blood pressure measured at 110/64.  He is on losartan, metoprolol and spironolactone.  Ischemic cardiomyopathy History of ischemic cardiomyopathy with an EF of 25% range on appropriate pharmacology.  He gets occasional shortness of breath but denies chest pain.  We will recheck a 2D echo for LV function.  Hyperlipidemia History of hyperlipidemia on atorvastatin which he takes sporadically.  His most recent lipid profile performed 06/25/2018 showed an LDL of 132.  We will recheck a lipid liver profile.      Lorretta Harp MD FACP,FACC,FAHA, [facility] 07/11/2019 2:03 PM

## 2019-07-18 ENCOUNTER — Other Ambulatory Visit: Payer: Self-pay

## 2019-07-18 ENCOUNTER — Ambulatory Visit (INDEPENDENT_AMBULATORY_CARE_PROVIDER_SITE_OTHER): Payer: Medicaid Other | Admitting: *Deleted

## 2019-07-18 DIAGNOSIS — I513 Intracardiac thrombosis, not elsewhere classified: Secondary | ICD-10-CM | POA: Diagnosis not present

## 2019-07-18 DIAGNOSIS — Z5181 Encounter for therapeutic drug level monitoring: Secondary | ICD-10-CM | POA: Diagnosis not present

## 2019-07-18 DIAGNOSIS — I240 Acute coronary thrombosis not resulting in myocardial infarction: Secondary | ICD-10-CM

## 2019-07-18 LAB — POCT INR: INR: 1.6 — AB (ref 2.0–3.0)

## 2019-07-18 NOTE — Patient Instructions (Signed)
Description   Today take another 1/2 pill then start taking 1.5 pills everyday except 1 pill on Mondays, Wednesdays and Fridays.  Recheck INR in 2 weeks.

## 2019-07-21 ENCOUNTER — Other Ambulatory Visit (HOSPITAL_COMMUNITY): Payer: Medicaid Other

## 2019-07-22 ENCOUNTER — Other Ambulatory Visit: Payer: Medicaid Other

## 2019-07-22 ENCOUNTER — Other Ambulatory Visit: Payer: Self-pay

## 2019-07-22 LAB — HEPATIC FUNCTION PANEL
ALT: 11 IU/L (ref 0–44)
AST: 19 IU/L (ref 0–40)
Albumin: 4.1 g/dL (ref 3.8–4.9)
Alkaline Phosphatase: 60 IU/L (ref 39–117)
Bilirubin Total: 0.4 mg/dL (ref 0.0–1.2)
Bilirubin, Direct: 0.14 mg/dL (ref 0.00–0.40)
Total Protein: 6.3 g/dL (ref 6.0–8.5)

## 2019-07-22 LAB — LIPID PANEL
Chol/HDL Ratio: 2.7 ratio (ref 0.0–5.0)
Cholesterol, Total: 130 mg/dL (ref 100–199)
HDL: 49 mg/dL (ref >39)
LDL Calculated: 66 mg/dL (ref 0–99)
Triglycerides: 73 mg/dL (ref 0–149)
VLDL Cholesterol Cal: 15 mg/dL (ref 5–40)

## 2019-07-24 ENCOUNTER — Encounter: Payer: Self-pay | Admitting: *Deleted

## 2019-08-07 ENCOUNTER — Telehealth: Payer: Self-pay

## 2019-08-07 NOTE — Telephone Encounter (Signed)
lmom for overdue inr 

## 2019-08-14 ENCOUNTER — Telehealth: Payer: Self-pay

## 2019-08-14 ENCOUNTER — Other Ambulatory Visit: Payer: Self-pay | Admitting: Cardiovascular Disease

## 2019-08-14 MED ORDER — LOSARTAN POTASSIUM 25 MG PO TABS: 12.5000 mg | ORAL_TABLET | Freq: Every day | ORAL | 3 refills | Status: AC

## 2019-08-14 MED ORDER — SPIRONOLACTONE 25 MG PO TABS: 12.5000 mg | ORAL_TABLET | Freq: Every day | ORAL | 3 refills | Status: AC

## 2019-08-14 MED ORDER — DIGOXIN 125 MCG PO TABS: 0.1250 mg | ORAL_TABLET | Freq: Every day | ORAL | 3 refills | Status: DC

## 2019-08-14 MED ORDER — METOPROLOL SUCCINATE ER 25 MG PO TB24: 12.5000 mg | ORAL_TABLET | Freq: Every day | ORAL | 3 refills | Status: AC

## 2019-08-14 NOTE — Telephone Encounter (Signed)
Requested Prescriptions   Signed Prescriptions Disp Refills  . spironolactone (ALDACTONE) 25 MG tablet 45 tablet 3    Sig: Take 0.5 tablets (12.5 mg total) by mouth at bedtime.    Authorizing Provider: Ledora Bottcher    Ordering User: NEWCOMER MCCLAIN, Patrycja Mumpower L  . digoxin (LANOXIN) 0.125 MG tablet 90 tablet 3    Sig: Take 1 tablet (0.125 mg total) by mouth daily.    Authorizing Provider: Ledora Bottcher    Ordering User: Raelene Bott, Mercia Dowe L

## 2019-08-14 NOTE — Addendum Note (Signed)
Addended by: Raelene Bott, BRANDY L on: 08/14/2019 03:49 PM   Modules accepted: Orders

## 2019-08-14 NOTE — Telephone Encounter (Signed)
Refill request

## 2019-08-14 NOTE — Telephone Encounter (Signed)
Requested Prescriptions   Signed Prescriptions Disp Refills  . spironolactone (ALDACTONE) 25 MG tablet 45 tablet 3    Sig: Take 0.5 tablets (12.5 mg total) by mouth at bedtime.    Authorizing Provider: Ledora Bottcher    Ordering User: Raelene Bott, BRANDY L

## 2019-08-14 NOTE — Telephone Encounter (Signed)
Requested Prescriptions   Signed Prescriptions Disp Refills  . metoprolol succinate (TOPROL XL) 25 MG 24 hr tablet 45 tablet 3    Sig: Take 0.5 tablets (12.5 mg total) by mouth daily.    Authorizing Provider: Ledora Bottcher    Ordering User: NEWCOMER MCCLAIN, Alon Mazor L  . losartan (COZAAR) 25 MG tablet 45 tablet 3    Sig: Take 0.5 tablets (12.5 mg total) by mouth daily.    Authorizing Provider: Ledora Bottcher    Ordering User: Raelene Bott, Malaiah Viramontes L

## 2019-08-15 ENCOUNTER — Encounter (HOSPITAL_COMMUNITY): Payer: Self-pay | Admitting: Cardiovascular Disease

## 2019-08-27 ENCOUNTER — Telehealth (HOSPITAL_COMMUNITY): Payer: Self-pay

## 2019-09-02 ENCOUNTER — Other Ambulatory Visit (HOSPITAL_COMMUNITY): Payer: Medicaid Other

## 2019-09-04 ENCOUNTER — Telehealth: Payer: Self-pay

## 2019-09-04 NOTE — Telephone Encounter (Signed)
lmtcb

## 2019-09-04 NOTE — Telephone Encounter (Signed)
New message   Just an FYI. We have made several attempts to contact this patient including sending a letter to schedule or reschedule their echocardiogram. We will be removing the patient from the echo WQ.   9.23.20 @ 4:19pm lm on home vm - Ricardo Brooks  9.11.20 mail reminder letter Ricardo Brooks   8.17.20 no show 8.17.20 lm on home vm - Ricardo Brooks

## 2019-09-04 NOTE — Telephone Encounter (Signed)
Called and lmomed for overdue inr 

## 2019-09-22 ENCOUNTER — Other Ambulatory Visit: Payer: Self-pay

## 2019-09-22 ENCOUNTER — Ambulatory Visit (INDEPENDENT_AMBULATORY_CARE_PROVIDER_SITE_OTHER): Payer: Medicaid Other | Admitting: Pharmacist

## 2019-09-22 DIAGNOSIS — I240 Acute coronary thrombosis not resulting in myocardial infarction: Secondary | ICD-10-CM

## 2019-09-22 DIAGNOSIS — Z5181 Encounter for therapeutic drug level monitoring: Secondary | ICD-10-CM

## 2019-09-22 LAB — POCT INR: INR: 1.2 — AB (ref 2.0–3.0)

## 2019-09-22 MED ORDER — WARFARIN SODIUM 5 MG PO TABS: ORAL_TABLET | ORAL | 0 refills | Status: DC

## 2019-11-26 ENCOUNTER — Other Ambulatory Visit: Payer: Self-pay

## 2019-11-26 ENCOUNTER — Ambulatory Visit (INDEPENDENT_AMBULATORY_CARE_PROVIDER_SITE_OTHER): Payer: Medicaid Other | Admitting: Pharmacist Clinician (PhC)/ Clinical Pharmacy Specialist

## 2019-11-26 DIAGNOSIS — Z5181 Encounter for therapeutic drug level monitoring: Secondary | ICD-10-CM | POA: Diagnosis not present

## 2019-11-26 DIAGNOSIS — I240 Acute coronary thrombosis not resulting in myocardial infarction: Secondary | ICD-10-CM

## 2019-11-26 LAB — POCT INR: INR: 1.7 — AB (ref 2.0–3.0)

## 2019-11-26 MED ORDER — WARFARIN SODIUM 5 MG PO TABS: ORAL_TABLET | ORAL | 0 refills | Status: DC

## 2020-04-30 ENCOUNTER — Telehealth: Payer: Self-pay | Admitting: Pharmacist

## 2020-04-30 NOTE — Telephone Encounter (Signed)
Overdue INR

## 2020-05-04 ENCOUNTER — Telehealth: Payer: Self-pay

## 2020-05-04 NOTE — Telephone Encounter (Signed)
Unable to lmom for overdue inr. No voicemail setup

## 2020-05-04 NOTE — Telephone Encounter (Signed)
Unable to lmom for overdue inr no voicemail set up 

## 2020-05-05 ENCOUNTER — Telehealth: Payer: Self-pay

## 2020-05-05 NOTE — Telephone Encounter (Signed)
lmom for overdue inr check and the pt's phone number was changed

## 2020-05-08 ENCOUNTER — Emergency Department (HOSPITAL_COMMUNITY): Payer: Medicaid Other

## 2020-05-08 ENCOUNTER — Encounter (HOSPITAL_COMMUNITY): Payer: Self-pay | Admitting: Emergency Medicine

## 2020-05-08 ENCOUNTER — Other Ambulatory Visit: Payer: Self-pay

## 2020-05-08 ENCOUNTER — Emergency Department (HOSPITAL_COMMUNITY)
Admission: EM | Admit: 2020-05-08 | Discharge: 2020-05-08 | Disposition: A | Discharge status: Home / Self Care | Payer: Medicaid Other | Attending: Emergency Medicine | Admitting: Emergency Medicine

## 2020-05-08 DIAGNOSIS — Y999 Unspecified external cause status: Secondary | ICD-10-CM | POA: Diagnosis not present

## 2020-05-08 DIAGNOSIS — Y939 Activity, unspecified: Secondary | ICD-10-CM | POA: Insufficient documentation

## 2020-05-08 DIAGNOSIS — F17210 Nicotine dependence, cigarettes, uncomplicated: Secondary | ICD-10-CM | POA: Diagnosis not present

## 2020-05-08 DIAGNOSIS — I2510 Atherosclerotic heart disease of native coronary artery without angina pectoris: Secondary | ICD-10-CM | POA: Diagnosis not present

## 2020-05-08 DIAGNOSIS — I10 Essential (primary) hypertension: Secondary | ICD-10-CM | POA: Diagnosis not present

## 2020-05-08 DIAGNOSIS — Y92410 Unspecified street and highway as the place of occurrence of the external cause: Secondary | ICD-10-CM | POA: Insufficient documentation

## 2020-05-08 DIAGNOSIS — S3992XA Unspecified injury of lower back, initial encounter: Secondary | ICD-10-CM | POA: Diagnosis present

## 2020-05-08 DIAGNOSIS — S39012A Strain of muscle, fascia and tendon of lower back, initial encounter: Secondary | ICD-10-CM

## 2020-05-08 DIAGNOSIS — Z79899 Other long term (current) drug therapy: Secondary | ICD-10-CM | POA: Diagnosis not present

## 2020-05-08 MED ORDER — HYDROCODONE-ACETAMINOPHEN 5-325 MG PO TABS
1.0000 | ORAL_TABLET | Freq: Once | ORAL | Status: AC
  Administered 2020-05-08: 1 via ORAL
  Filled 2020-05-08: qty 1

## 2020-05-08 MED ORDER — CYCLOBENZAPRINE HCL 10 MG PO TABS
5.0000 mg | ORAL_TABLET | Freq: Once | ORAL | Status: AC
  Administered 2020-05-08: 5 mg via ORAL
  Filled 2020-05-08: qty 1

## 2020-05-08 MED ORDER — NAPROXEN 250 MG PO TABS
500.0000 mg | ORAL_TABLET | Freq: Once | ORAL | Status: DC
  Filled 2020-05-08: qty 2

## 2020-05-08 MED ORDER — CYCLOBENZAPRINE HCL 10 MG PO TABS: 10.0000 mg | ORAL_TABLET | Freq: Two times a day (BID) | ORAL | 0 refills | Status: AC | PRN

## 2020-05-08 NOTE — ED Notes (Signed)
PT not in bay.  

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
## 2020-05-26 ENCOUNTER — Other Ambulatory Visit: Payer: Self-pay

## 2020-05-26 ENCOUNTER — Emergency Department (HOSPITAL_COMMUNITY)
Admission: EM | Admit: 2020-05-26 | Discharge: 2020-05-26 | Disposition: A | Discharge status: Home / Self Care | Payer: Medicaid Other | Attending: Emergency Medicine | Admitting: Emergency Medicine

## 2020-05-26 ENCOUNTER — Encounter (HOSPITAL_COMMUNITY): Payer: Self-pay | Admitting: Emergency Medicine

## 2020-05-26 DIAGNOSIS — M545 Low back pain: Secondary | ICD-10-CM | POA: Insufficient documentation

## 2020-05-26 DIAGNOSIS — M25551 Pain in right hip: Secondary | ICD-10-CM | POA: Insufficient documentation

## 2020-05-26 DIAGNOSIS — Z5321 Procedure and treatment not carried out due to patient leaving prior to being seen by health care provider: Secondary | ICD-10-CM | POA: Diagnosis not present

## 2020-05-26 NOTE — ED Notes (Signed)
Pt says that he has to make it to another doctors appointment and will come back tomorrow if he needs to. notified pt that he should stay. Pt seen leaving

## 2020-05-26 NOTE — ED Triage Notes (Signed)
Patient arrives to ED with complaints of right hip/right lower back pain. Patient states the pain is a shooting pain and has been getting worse.

## 2020-05-29 ENCOUNTER — Encounter (HOSPITAL_COMMUNITY): Payer: Self-pay | Admitting: Emergency Medicine

## 2020-05-29 ENCOUNTER — Other Ambulatory Visit: Payer: Self-pay

## 2020-05-29 ENCOUNTER — Emergency Department (HOSPITAL_COMMUNITY)
Admission: EM | Admit: 2020-05-29 | Discharge: 2020-05-29 | Disposition: A | Discharge status: Home / Self Care | Payer: Medicaid Other | Attending: Emergency Medicine | Admitting: Emergency Medicine

## 2020-05-29 DIAGNOSIS — M5441 Lumbago with sciatica, right side: Secondary | ICD-10-CM | POA: Diagnosis not present

## 2020-05-29 DIAGNOSIS — M545 Low back pain: Secondary | ICD-10-CM | POA: Diagnosis present

## 2020-05-29 DIAGNOSIS — F17210 Nicotine dependence, cigarettes, uncomplicated: Secondary | ICD-10-CM | POA: Diagnosis not present

## 2020-05-29 DIAGNOSIS — Z79899 Other long term (current) drug therapy: Secondary | ICD-10-CM | POA: Insufficient documentation

## 2020-05-29 DIAGNOSIS — I509 Heart failure, unspecified: Secondary | ICD-10-CM | POA: Diagnosis not present

## 2020-05-29 DIAGNOSIS — Z7982 Long term (current) use of aspirin: Secondary | ICD-10-CM | POA: Diagnosis not present

## 2020-05-29 DIAGNOSIS — M5431 Sciatica, right side: Secondary | ICD-10-CM

## 2020-05-29 DIAGNOSIS — I110 Hypertensive heart disease with heart failure: Secondary | ICD-10-CM | POA: Diagnosis not present

## 2020-05-29 MED ORDER — TRAMADOL HCL 50 MG PO TABS
50.0000 mg | ORAL_TABLET | Freq: Once | ORAL | Status: AC
  Administered 2020-05-29: 50 mg via ORAL
  Filled 2020-05-29: qty 1

## 2020-05-29 MED ORDER — TRAMADOL HCL 50 MG PO TABS: 50.0000 mg | ORAL_TABLET | Freq: Four times a day (QID) | ORAL | 0 refills | Status: AC | PRN

## 2020-05-29 MED ORDER — DEXAMETHASONE SODIUM PHOSPHATE 10 MG/ML IJ SOLN
10.0000 mg | Freq: Once | INTRAMUSCULAR | Status: AC
  Administered 2020-05-29: 10 mg via INTRAMUSCULAR
  Filled 2020-05-29: qty 1

## 2020-05-29 MED ORDER — CYCLOBENZAPRINE HCL 10 MG PO TABS
5.0000 mg | ORAL_TABLET | Freq: Once | ORAL | Status: AC
  Administered 2020-05-29: 5 mg via ORAL
  Filled 2020-05-29: qty 1

## 2020-05-29 MED ORDER — CYCLOBENZAPRINE HCL 10 MG PO TABS
10.0000 mg | ORAL_TABLET | Freq: Two times a day (BID) | ORAL | 0 refills | Status: AC | PRN
Start: 2020-05-29 — End: ?
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
## 2020-05-29 NOTE — Discharge Instructions (Signed)
Take Flexeril as needed for muscle pain or spasms Take Tramadol as needed for pain Please follow up with your PCP regarding your pain or follow up with orthopedics

## 2020-05-29 NOTE — ED Notes (Signed)
Patient verbalizes understanding of discharge instructions. Opportunity for questioning and answers were provided. Armband removed by staff, pt discharged from ED.  

## 2020-05-31 ENCOUNTER — Telehealth: Payer: Self-pay

## 2020-05-31 NOTE — Telephone Encounter (Signed)
lmomed o schedule inr

## 2020-06-01 ENCOUNTER — Other Ambulatory Visit: Payer: Self-pay | Admitting: Cardiovascular Disease

## 2020-07-25 ENCOUNTER — Emergency Department (HOSPITAL_COMMUNITY)
Admission: EM | Admit: 2020-07-25 | Discharge: 2020-07-25 | Disposition: A | Discharge status: Home / Self Care | Payer: Medicaid Other | Attending: Emergency Medicine | Admitting: Emergency Medicine

## 2020-07-25 ENCOUNTER — Other Ambulatory Visit: Payer: Self-pay

## 2020-07-25 ENCOUNTER — Encounter (HOSPITAL_COMMUNITY): Payer: Self-pay

## 2020-07-25 DIAGNOSIS — M545 Low back pain: Secondary | ICD-10-CM | POA: Diagnosis present

## 2020-07-25 DIAGNOSIS — Z5321 Procedure and treatment not carried out due to patient leaving prior to being seen by health care provider: Secondary | ICD-10-CM | POA: Insufficient documentation

## 2020-07-25 NOTE — ED Triage Notes (Signed)
Patient complains of pain to back and states that he has had sciatica like this before. Patient alert and oriented, pain with ambulation

## 2020-07-25 NOTE — ED Notes (Signed)
Called patient to update V/S, unable to locate at his time.

## 2020-09-23 ENCOUNTER — Other Ambulatory Visit: Payer: Self-pay | Admitting: Cardiovascular Disease

## 2020-09-23 NOTE — Telephone Encounter (Signed)
*  STAT* If patient is at the pharmacy, call can be transferred to refill team.   1. Which medications need to be refilled? (please list name of each medication and dose if known) warfarin (COUMADIN) 5 MG tablet  2. Which pharmacy/location (including street and city if local pharmacy) is medication to be sent to? [facility] - [place], [place] - [address]  3. Do they need a 30 day or 90 day supply? 90 day supply  Patient states he is completely out of medication.

## 2020-09-27 ENCOUNTER — Other Ambulatory Visit: Payer: Self-pay | Admitting: Cardiovascular Disease

## 2020-09-27 MED ORDER — WARFARIN SODIUM 5 MG PO TABS: ORAL_TABLET | ORAL | 0 refills | Status: DC

## 2020-09-27 NOTE — Telephone Encounter (Signed)
Patient calling in to request an update on this refill. He states he ran out of medication yesterday.   *STAT* If patient is at the pharmacy, call can be transferred to refill team.   1. Which medications need to be refilled? (please list name of each medication and dose if known) warfarin (COUMADIN) 5 MG tablet  2. Which pharmacy/location (including street and city if local pharmacy) is medication to be sent to? [facility] - [place], [place] - [address]  3. Do they need a 30 day or 90 day supply? 90 day supply  If he needs to be contacted his number is [phone].

## 2020-09-29 MED ORDER — WARFARIN SODIUM 5 MG PO TABS: ORAL_TABLET | ORAL | 0 refills | Status: DC

## 2020-09-29 NOTE — Telephone Encounter (Signed)
Patient called again to check on status.  He his out of medication.

## 2020-09-29 NOTE — Addendum Note (Signed)
Addended by: Eather Colas on: 09/29/2020 10:41 AM   Modules accepted: Orders

## 2020-09-29 NOTE — Telephone Encounter (Signed)
Called and lmomed the pt that they may receive 10 tablets w/instructions on the bottle that states inr appt needed for further refills

## 2020-10-22 ENCOUNTER — Telehealth: Payer: Self-pay

## 2020-10-22 NOTE — Telephone Encounter (Signed)
lpmtcb regarding Coumadin appointment.  Advised him that there are openings next Wednesday 11/24

## 2020-10-27 ENCOUNTER — Other Ambulatory Visit: Payer: Self-pay

## 2020-10-27 ENCOUNTER — Ambulatory Visit (INDEPENDENT_AMBULATORY_CARE_PROVIDER_SITE_OTHER): Payer: Medicaid Other

## 2020-10-27 DIAGNOSIS — I240 Acute coronary thrombosis not resulting in myocardial infarction: Secondary | ICD-10-CM | POA: Diagnosis not present

## 2020-10-27 DIAGNOSIS — Z7901 Long term (current) use of anticoagulants: Secondary | ICD-10-CM | POA: Diagnosis not present

## 2020-10-27 LAB — POCT INR: INR: 1.1 — AB (ref 2.0–3.0)

## 2020-10-27 MED ORDER — WARFARIN SODIUM 5 MG PO TABS: ORAL_TABLET | ORAL | 0 refills | Status: DC

## 2020-10-27 NOTE — Patient Instructions (Signed)
Take 2 tablets tonight and then continue taking 1.5 tablets daily except 1 tablet on Monday, Wednesday and Friday INR in 1 week

## 2020-12-16 ENCOUNTER — Other Ambulatory Visit: Payer: Self-pay | Admitting: Cardiovascular Disease

## 2020-12-16 NOTE — Telephone Encounter (Signed)
*  STAT* If patient is at the pharmacy, call can be transferred to refill team.   1. Which medications need to be refilled? (please list name of each medication and dose if known) All medications besides warfarin   2. Which pharmacy/location (including street and city if local pharmacy) is medication to be sent to? [facility] - [place], [place] - [address]  3. Do they need a 30 day or 90 day supply? 90 day supply

## 2020-12-17 MED ORDER — WARFARIN SODIUM 5 MG PO TABS: ORAL_TABLET | ORAL | 0 refills | Status: DC

## 2020-12-20 ENCOUNTER — Telehealth: Payer: Self-pay | Admitting: Cardiovascular Disease

## 2020-12-20 NOTE — Telephone Encounter (Signed)
Spoke with patient to reschedule the1/18/22 appt with Dr. Gae Gallop to 02/01/21 at 9:30 am---patient voiced his understanding.

## 2020-12-21 ENCOUNTER — Ambulatory Visit: Payer: Medicaid Other | Admitting: Cardiovascular Disease

## 2021-02-01 ENCOUNTER — Ambulatory Visit: Payer: Medicaid Other | Admitting: Cardiovascular Disease

## 2021-02-16 ENCOUNTER — Ambulatory Visit (INDEPENDENT_AMBULATORY_CARE_PROVIDER_SITE_OTHER): Payer: Medicaid Other

## 2021-02-16 ENCOUNTER — Other Ambulatory Visit: Payer: Self-pay

## 2021-02-16 DIAGNOSIS — I240 Acute coronary thrombosis not resulting in myocardial infarction: Secondary | ICD-10-CM | POA: Diagnosis not present

## 2021-02-16 DIAGNOSIS — Z7901 Long term (current) use of anticoagulants: Secondary | ICD-10-CM | POA: Diagnosis not present

## 2021-02-16 LAB — POCT INR: INR: 1.9 — AB (ref 2.0–3.0)

## 2021-02-16 NOTE — Patient Instructions (Signed)
Description   Take an extra 1/2 tablet today, then resume same dosage of Warfarin 1.5 tablets daily except 1 tablet on Mondays, Wednesdays and Fridays.  Recheck in 3 weeks.

## 2021-03-07 ENCOUNTER — Telehealth: Payer: Self-pay | Admitting: Cardiovascular Disease

## 2021-03-07 MED ORDER — WARFARIN SODIUM 5 MG PO TABS: ORAL_TABLET | ORAL | 0 refills | Status: DC

## 2021-03-07 NOTE — Telephone Encounter (Signed)
 *  STAT* If patient is at the pharmacy, call can be transferred to refill team.   1. Which medications need to be refilled? (please list name of each medication and dose if known)   warfarin (COUMADIN) 5 MG tablet  2. Which pharmacy/location (including street and city if local pharmacy) is medication to be sent to?  [facility] - [place], [place] - [address]  3. Do they need a 30 day or 90 day supply? 30 day

## 2021-03-09 ENCOUNTER — Ambulatory Visit (INDEPENDENT_AMBULATORY_CARE_PROVIDER_SITE_OTHER): Payer: Medicaid Other

## 2021-03-09 ENCOUNTER — Other Ambulatory Visit: Payer: Self-pay

## 2021-03-09 ENCOUNTER — Ambulatory Visit (INDEPENDENT_AMBULATORY_CARE_PROVIDER_SITE_OTHER): Payer: Medicaid Other | Admitting: Cardiovascular Disease

## 2021-03-09 ENCOUNTER — Encounter: Payer: Self-pay | Admitting: Cardiovascular Disease

## 2021-03-09 DIAGNOSIS — E782 Mixed hyperlipidemia: Secondary | ICD-10-CM

## 2021-03-09 DIAGNOSIS — I255 Ischemic cardiomyopathy: Secondary | ICD-10-CM

## 2021-03-09 DIAGNOSIS — I240 Acute coronary thrombosis not resulting in myocardial infarction: Secondary | ICD-10-CM

## 2021-03-09 DIAGNOSIS — Z7901 Long term (current) use of anticoagulants: Secondary | ICD-10-CM

## 2021-03-09 DIAGNOSIS — I2510 Atherosclerotic heart disease of native coronary artery without angina pectoris: Secondary | ICD-10-CM | POA: Diagnosis not present

## 2021-03-09 DIAGNOSIS — I10 Essential (primary) hypertension: Secondary | ICD-10-CM

## 2021-03-09 LAB — POCT INR: INR: 1.7 — AB (ref 2.0–3.0)

## 2021-03-09 MED ORDER — FUROSEMIDE 40 MG PO TABS: 20.0000 mg | ORAL_TABLET | Freq: Every day | ORAL | 3 refills | Status: DC

## 2021-03-09 NOTE — Assessment & Plan Note (Signed)
History of CAD status post cardiac catheterization performed by Dr. Okey Dupre 02/14/2018 revealing a subtotally occluded ostial LAD with TIMI I flow and bidirectional collaterals.  Subsequent viability study showed a scarred anterior wall with apical mural thrombus.  He denies chest pain.

## 2021-03-09 NOTE — Assessment & Plan Note (Signed)
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  EF was 25 to 30% with a regional wall motion abnormality in the LAD distribution.

## 2021-03-09 NOTE — Patient Instructions (Signed)
Take an extra 1/2 tablet today, then increase dosage of Warfarin 1.5 tablets daily except 1 tablet on Mondays and Fridays.  Recheck in 4 weeks.

## 2021-03-09 NOTE — Progress Notes (Signed)
03/09/2021 Sydnee Cabal   07/13/1956  [id number]  Primary Physician [place], Duke Salvia, MD Primary Cardiologist: Runell Gess MD Nicholes Calamity, [place]  HPI:  Keatin Benham is a 65 y.o.  thin appearing divorced African-American male father of 3 children, grandfather of one grandchild who currently does not work.  I last saw him in the office 07/11/2019.    He has a history of tobacco abuse, hyperlipidemia and ischemic cardiomyopathy.  He had a right left heart cath by Dr. Okey Dupre 02/14/2018 revealing subtotally occluded ostial LAD with TIMI I flow by directional collaterals.  Subsequent viability study showed a scarred anterior wall and anterior mural thrombus.  He has been on Coumadin anticoagulation with variable INRs.  He does continue to smoke 3 to 4 cigarettes a day.  He denies chest pain or shortness of breath.  His last 2D echo performed 06/25/2018 revealed an EF of 25 to 30% with regional wall motion abnormalities in the LAD territory but no evidence of apical mural thrombus.   Current Meds  Medication Sig  . aspirin EC 81 MG EC tablet Take 1 tablet (81 mg total) by mouth daily.  Marland Kitchen atorvastatin (LIPITOR) 80 MG tablet Take 1 tablet (80 mg total) by mouth daily at 6 PM.  . cyclobenzaprine (FLEXERIL) 10 MG tablet Take 1 tablet (10 mg total) by mouth 2 (two) times daily as needed for muscle spasms.  . digoxin (LANOXIN) 0.125 MG tablet Take 1 tablet (0.125 mg total) by mouth daily.  . furosemide (LASIX) 40 MG tablet Take 0.5 tablets (20 mg total) by mouth daily.  Marland Kitchen ibuprofen (ADVIL) 800 MG tablet Take 1 tablet (800 mg total) by mouth every 8 (eight) hours as needed for mild pain.  Marland Kitchen losartan (COZAAR) 25 MG tablet Take 0.5 tablets (12.5 mg total) by mouth daily.  . metoprolol succinate (TOPROL XL) 25 MG 24 hr tablet Take 0.5 tablets (12.5 mg total) by mouth daily.  . nitroGLYCERIN (NITROSTAT) 0.4 MG SL tablet Place 1 tablet (0.4 mg total) under the tongue every 5 (five) minutes x 3 doses as  needed for chest pain.  Marland Kitchen oxyCODONE-acetaminophen (PERCOCET) 10-325 MG tablet Take 1 tablet by mouth 3 (three) times daily as needed.  Marland Kitchen spironolactone (ALDACTONE) 25 MG tablet Take 0.5 tablets (12.5 mg total) by mouth at bedtime.  . traMADol (ULTRAM) 50 MG tablet Take 1 tablet (50 mg total) by mouth every 6 (six) hours as needed.  . warfarin (COUMADIN) 5 MG tablet TAKE 1 TO 1 & 1/2 TABLETS BY MOUTH DAILY AS DIRECTED. NO REFILLS UNTIL INR APPT SCHEDULED     Allergies  Allergen Reactions  . Methocarbamol Nausea And Vomiting and Anxiety    Social History   Socioeconomic History  . Marital status: Divorced    Spouse name: Not on file  . Number of children: Not on file  . Years of education: Not on file  . Highest education level: Not on file  Occupational History  . Not on file  Tobacco Use  . Smoking status: Current Every Day Smoker    Packs/day: 0.50    Years: 41.00    Pack years: 20.50    Types: Cigarettes  . Smokeless tobacco: Never Used  Vaping Use  . Vaping Use: Never used  Substance and Sexual Activity  . Alcohol use: Yes    Alcohol/week: 1.0 standard drink    Types: 1 Cans of beer per week  . Drug use: Not Currently  . Sexual  activity: Yes  Other Topics Concern  . Not on file  Social History Narrative  . Not on file   Social Determinants of Health   Financial Resource Strain: Not on file  Food Insecurity: Not on file  Transportation Needs: Not on file  Physical Activity: Not on file  Stress: Not on file  Social Connections: Not on file  Intimate Partner Violence: Not on file     Review of Systems: General: negative for chills, fever, night sweats or weight changes.  Cardiovascular: negative for chest pain, dyspnea on exertion, edema, orthopnea, palpitations, paroxysmal nocturnal dyspnea or shortness of breath Dermatological: negative for rash Respiratory: negative for cough or wheezing Urologic: negative for hematuria Abdominal: negative for nausea,  vomiting, diarrhea, bright red blood per rectum, melena, or hematemesis Neurologic: negative for visual changes, syncope, or dizziness All other systems reviewed and are otherwise negative except as noted above.    Blood pressure 128/76, pulse 63, height 5' 9.5" (1.765 m), weight 140 lb (63.5 kg), SpO2 98 %.  General appearance: alert and no distress Neck: no adenopathy, no carotid bruit, no JVD, supple, symmetrical, trachea midline and thyroid not enlarged, symmetric, no tenderness/mass/nodules Lungs: clear to auscultation bilaterally Heart: regular rate and rhythm, S1, S2 normal, no murmur, click, rub or gallop Extremities: extremities normal, atraumatic, no cyanosis or edema Pulses: 2+ and symmetric Skin: Skin color, texture, turgor normal. No rashes or lesions Neurologic: Alert and oriented X 3, normal strength and tone. Normal symmetric reflexes. Normal coordination and gait  EKG sinus rhythm at 63 with poor R wave progression consistent with an old anteroseptal infarct, lateral T wave inversion and low limb voltage.  I personally reviewed this EKG.  ASSESSMENT AND PLAN:   CAD (coronary artery disease) History of CAD status post cardiac catheterization performed by Dr. Okey Dupre 02/14/2018 revealing a subtotally occluded ostial LAD with TIMI I flow and bidirectional collaterals.  Subsequent viability study showed a scarred anterior wall with apical mural thrombus.  He denies chest pain.  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  EF was 25 to 30% with a regional wall motion abnormality in the LAD distribution.  Hypertension History of essential hypertension a blood pressure measured today at 128/76.  He is on losartan and metoprolol.  Ischemic cardiomyopathy Appropriate medicalHistory of ischemic cardiomyopathy with an EF in the 25 to 30% range  by echo 3 years ago patient's.  He does not have symptoms of heart failure.  I am going to recheck a 2D echocardiogram.  If his EF remains in the depressed range he may be a candidate for a ICD for primary prevention.  Hyperlipidemia History of hyperlipidemia on statin therapy with lipid profile performed 07/22/2019 revealing a total cholesterol of 130, LDL of 66 and HDL 49.  I am going to recheck a fasting lipid and liver profile.      Runell Gess MD FACP,FACC,FAHA, [facility] 03/09/2021 3:18 PM

## 2021-03-09 NOTE — Assessment & Plan Note (Signed)
Appropriate medicalHistory of ischemic cardiomyopathy with an EF in the 25 to 30% range by echo 3 years ago patient's.  He does not have symptoms of heart failure.  I am going to recheck a 2D echocardiogram.  If his EF remains in the depressed range he may be a candidate for a ICD for primary prevention.

## 2021-03-09 NOTE — Assessment & Plan Note (Signed)
History of essential hypertension a blood pressure measured today at 128/76.  He is on losartan and metoprolol.

## 2021-03-09 NOTE — Patient Instructions (Signed)
Medication Instructions:  Your physician recommends that you continue on your current medications as directed. Please refer to the Current Medication list given to you today.   *If you need a refill on your cardiac medications before your next appointment, please call your pharmacy*   Lab Work: Your physician recommends that you return for lab work in: next 1-2 weeks for fasting lipid/liver profile.  If you have labs (blood work) drawn today and your tests are completely normal, you will receive your results only by: Marland Kitchen MyChart Message (if you have MyChart) OR . A paper copy in the mail If you have any lab test that is abnormal or we need to change your treatment, we will call you to review the results.   Testing/Procedures: Your physician has requested that you have an echocardiogram. Echocardiography is a painless test that uses sound waves to create images of your heart. It provides your doctor with information about the size and shape of your heart and how well your heart's chambers and valves are working. This procedure takes approximately one hour. There are no restrictions for this procedure. This procedure is done at [address] 3rd floor   Follow-Up: At [facility], you and your health needs are our priority.  As part of our continuing mission to provide you with exceptional heart care, we have created designated Provider Care Teams.  These Care Teams include your primary Cardiologist (physician) and Advanced Practice Providers (APPs -  Physician Assistants and Nurse Practitioners) who all work together to provide you with the care you need, when you need it.  We recommend signing up for the patient portal called "MyChart".  Sign up information is provided on this After Visit Summary.  MyChart is used to connect with patients for Virtual Visits (Telemedicine).  Patients are able to view lab/test results, encounter notes, upcoming appointments, etc.  Non-urgent messages can be  sent to your provider as well.   To learn more about what you can do with MyChart, go to [email or web address].    Your next appointment:   12 month(s)  The format for your next appointment:   In Person  Provider:   Nanetta Batty, MD

## 2021-03-09 NOTE — Assessment & Plan Note (Signed)
History of hyperlipidemia on statin therapy with lipid profile performed 07/22/2019 revealing a total cholesterol of 130, LDL of 66 and HDL 49.  I am going to recheck a fasting lipid and liver profile.

## 2021-03-10 ENCOUNTER — Telehealth: Payer: Self-pay | Admitting: Licensed Clinical Social Worker

## 2021-03-10 NOTE — Progress Notes (Signed)
Heart and Vascular Care Navigation  03/10/2021  Ricardo Brooks 1956/07/30 [id number]  Reason for Referral:  Transportation needs. Engaged with patient by telephone for initial visit for Heart and Vascular Care Coordination.                                                                                                   Assessment:           LCSW spoke with pt via telephone, introduced self, role, reason for call. I shared that our pharmacy team had noted that he shared that it takes him an extended time to get to appointments. Pt confirmed stating that he has to take an earlier bus to get there for his appointments. LCSW offered to connect pt to our [employer] department which could pick pt up and bring him straight to appointments if there was days that he feels he needs to get to his appointments directly and would like to avoid the length of bus ride. Pt states that he may be interested but right now is okay. I offered to send my card with a reminder of upcoming appointments and he could reach out to me before those appointments if he would like for me to schedule him rides and connect him moving forward. Pt agreeable. When asked about other current needs pt denies sharing that he is doing okay currently. Reminded him I remain available as needed moving forward.                     HRT/VAS Care Coordination    Patients Home Cardiology Office [facility]   Outpatient Care Team Social Worker   Social Worker Name: Esmeralda Links [place], [phone]   Living arrangements for the past 2 months Apartment   Patient Current Insurance Coverage Medicaid   Patient Has Concern With Paying Medical Bills No   Does Patient Have Prescription Coverage? Yes   Home Assistive Devices/Equipment None      Social History:                                                                             SDOH Screenings   Alcohol Screen: Not on file  Depression (ZYS0-6): Not  on file  Financial Resource Strain: Not on file  Food Insecurity: Not on file  Housing: Not on file  Physical Activity: Not on file  Social Connections: Not on file  Stress: Not on file  Tobacco Use: High Risk  . Smoking Tobacco Use: Current Every Day Smoker  . Smokeless Tobacco Use: Never Used  Transportation Needs: Unmet Transportation Needs  . Lack of Transportation (Medical): Yes  . Lack of Transportation (Non-Medical): Yes    SDOH Interventions: Transportation:   Transportation Interventions: Retail banker    Other Care Navigation Interventions:  Provided Pharmacy assistance resources  Pt has prescription coverage w/ Medicaid   Follow-up plan:   Pt mailed my business card, I will put a reminder on my calendar to f/u with pt prior to appt on 5/5.

## 2021-04-04 ENCOUNTER — Telehealth: Payer: Self-pay | Admitting: Licensed Clinical Social Worker

## 2021-04-04 NOTE — Telephone Encounter (Signed)
LCSW called to f/u with pt regarding coumadin appointment this Wednesday and to offer a ride as he takes public transportation to our office and must leave very early to make it on time. LCSW was able to reach pt at [phone]. I re-introduced self, role, reason for call. Pt at this time declines ride states he will be okay getting here this week. I reminded him to let me know if that changes and we can arrange a ride w/ [facility].   Ricardo Brooks, MSW, LCSW [facility] Heart/Vascular Care Navigation  [phone]

## 2021-04-07 ENCOUNTER — Ambulatory Visit (HOSPITAL_COMMUNITY): Payer: Medicaid Other | Attending: Cardiovascular Disease

## 2021-04-08 ENCOUNTER — Telehealth: Payer: Self-pay

## 2021-04-08 ENCOUNTER — Encounter (HOSPITAL_COMMUNITY): Payer: Self-pay | Admitting: Cardiovascular Disease

## 2021-04-08 NOTE — Telephone Encounter (Signed)
lmom for overdue inr 

## 2021-04-13 ENCOUNTER — Telehealth: Payer: Self-pay

## 2021-04-13 NOTE — Telephone Encounter (Signed)
lmom for overdue inr 

## 2021-04-22 ENCOUNTER — Telehealth (HOSPITAL_COMMUNITY): Payer: Self-pay | Admitting: Cardiovascular Disease

## 2021-04-22 NOTE — Telephone Encounter (Signed)
Just an FYI. We have made several attempts to contact this patient including sending a letter to schedule or reschedule their echocardiogram. We will be removing the patient from the echo WQ.  04/07/21 NO SHOWED -MAILED LETTER LBW    Thank you

## 2021-04-25 ENCOUNTER — Telehealth: Payer: Self-pay | Admitting: Cardiovascular Disease

## 2021-04-25 MED ORDER — WARFARIN SODIUM 5 MG PO TABS: ORAL_TABLET | ORAL | 0 refills | Status: DC

## 2021-04-25 NOTE — Telephone Encounter (Signed)
*  STAT* If patient is at the pharmacy, call can be transferred to refill team.   1. Which medications need to be refilled? (please list name of each medication and dose if known) Warfrin   2. Which pharmacy/location (including street and city if local pharmacy) is medication to be sent to? [facility]  3. Do they need a 30 day or 90 day supply? 90

## 2021-04-27 ENCOUNTER — Ambulatory Visit (INDEPENDENT_AMBULATORY_CARE_PROVIDER_SITE_OTHER): Payer: Medicaid Other | Admitting: *Deleted

## 2021-04-27 ENCOUNTER — Other Ambulatory Visit: Payer: Self-pay

## 2021-04-27 DIAGNOSIS — I240 Acute coronary thrombosis not resulting in myocardial infarction: Secondary | ICD-10-CM

## 2021-04-27 DIAGNOSIS — Z7901 Long term (current) use of anticoagulants: Secondary | ICD-10-CM

## 2021-04-27 DIAGNOSIS — Z5181 Encounter for therapeutic drug level monitoring: Secondary | ICD-10-CM | POA: Diagnosis not present

## 2021-04-27 LAB — POCT INR: INR: 1.1 — AB (ref 2.0–3.0)

## 2021-04-27 NOTE — Patient Instructions (Signed)
Description   Today and tomorrow take 2 tablets of Warfarin then continue taking Warfarin 1.5 tablets daily except 1 tablet on Mondays and Fridays.  Recheck in 1 week.

## 2021-05-25 ENCOUNTER — Ambulatory Visit (INDEPENDENT_AMBULATORY_CARE_PROVIDER_SITE_OTHER): Payer: Medicaid Other | Admitting: *Deleted

## 2021-05-25 ENCOUNTER — Other Ambulatory Visit: Payer: Self-pay

## 2021-05-25 ENCOUNTER — Ambulatory Visit (HOSPITAL_COMMUNITY): Payer: Medicaid Other | Attending: Cardiology

## 2021-05-25 DIAGNOSIS — I240 Acute coronary thrombosis not resulting in myocardial infarction: Secondary | ICD-10-CM

## 2021-05-25 DIAGNOSIS — E782 Mixed hyperlipidemia: Secondary | ICD-10-CM | POA: Diagnosis present

## 2021-05-25 DIAGNOSIS — I2510 Atherosclerotic heart disease of native coronary artery without angina pectoris: Secondary | ICD-10-CM

## 2021-05-25 DIAGNOSIS — I255 Ischemic cardiomyopathy: Secondary | ICD-10-CM | POA: Diagnosis not present

## 2021-05-25 DIAGNOSIS — I10 Essential (primary) hypertension: Secondary | ICD-10-CM | POA: Insufficient documentation

## 2021-05-25 DIAGNOSIS — Z7901 Long term (current) use of anticoagulants: Secondary | ICD-10-CM

## 2021-05-25 LAB — ECHOCARDIOGRAM COMPLETE
Area-P 1/2: 5.54 cm2
S' Lateral: 5.4 cm

## 2021-05-25 LAB — POCT INR: INR: 1.4 — AB (ref 2.0–3.0)

## 2021-05-25 MED ORDER — PERFLUTREN LIPID MICROSPHERE
1.0000 mL | INTRAVENOUS | Status: AC | PRN
  Administered 2021-05-25: 2 mL via INTRAVENOUS

## 2021-05-25 MED ORDER — WARFARIN SODIUM 5 MG PO TABS: ORAL_TABLET | ORAL | 0 refills | Status: DC

## 2021-05-25 NOTE — Patient Instructions (Signed)
Description   Today and tomorrow take 2 tablets of Warfarin then start taking the dose you should be taking, Warfarin 1.5 tablets daily except 1 tablet on Mondays and Fridays.  Recheck in 1 week.

## 2021-05-30 ENCOUNTER — Encounter: Payer: Self-pay | Admitting: *Deleted

## 2021-06-09 ENCOUNTER — Telehealth: Payer: Self-pay

## 2021-06-09 NOTE — Telephone Encounter (Signed)
Overdue inr lmom 

## 2021-06-17 ENCOUNTER — Other Ambulatory Visit: Payer: Self-pay

## 2021-06-17 ENCOUNTER — Ambulatory Visit (INDEPENDENT_AMBULATORY_CARE_PROVIDER_SITE_OTHER): Payer: Medicaid Other

## 2021-06-17 DIAGNOSIS — Z5181 Encounter for therapeutic drug level monitoring: Secondary | ICD-10-CM | POA: Diagnosis not present

## 2021-06-17 DIAGNOSIS — Z7901 Long term (current) use of anticoagulants: Secondary | ICD-10-CM | POA: Diagnosis not present

## 2021-06-17 DIAGNOSIS — I240 Acute coronary thrombosis not resulting in myocardial infarction: Secondary | ICD-10-CM | POA: Diagnosis not present

## 2021-06-17 LAB — POCT INR: INR: 3.6 — AB (ref 2.0–3.0)

## 2021-06-17 MED ORDER — WARFARIN SODIUM 5 MG PO TABS: ORAL_TABLET | ORAL | 0 refills | Status: DC

## 2021-06-17 NOTE — Patient Instructions (Signed)
-   have a serving of greens today, - since you've already taken your warfarin today, please skip it tomorrow, then - resume dosage of warfarin 1.5 tablets daily except 1 tablet on Mondays and Fridays.   - Recheck in 1 week.

## 2021-06-28 ENCOUNTER — Other Ambulatory Visit: Payer: Self-pay

## 2021-06-28 ENCOUNTER — Ambulatory Visit (INDEPENDENT_AMBULATORY_CARE_PROVIDER_SITE_OTHER): Payer: Medicaid Other

## 2021-06-28 DIAGNOSIS — Z7901 Long term (current) use of anticoagulants: Secondary | ICD-10-CM

## 2021-06-28 DIAGNOSIS — I240 Acute coronary thrombosis not resulting in myocardial infarction: Secondary | ICD-10-CM

## 2021-06-28 LAB — POCT INR: INR: 2.2 (ref 2.0–3.0)

## 2021-06-28 MED ORDER — WARFARIN SODIUM 5 MG PO TABS: ORAL_TABLET | ORAL | 0 refills | Status: DC

## 2021-06-28 MED ORDER — FUROSEMIDE 40 MG PO TABS: 20.0000 mg | ORAL_TABLET | Freq: Every day | ORAL | 3 refills | Status: DC

## 2021-06-28 NOTE — Patient Instructions (Signed)
Description   Continue on same dosage of warfarin 1.5 tablets daily except 1 tablet on Mondays and Fridays.   Recheck in 2 weeks.

## 2021-06-28 NOTE — Telephone Encounter (Signed)
Pt seen in [facility]in Clinic today, requesting refill on Furosemide be sent to [facility] pharmacy.

## 2021-07-15 ENCOUNTER — Ambulatory Visit (INDEPENDENT_AMBULATORY_CARE_PROVIDER_SITE_OTHER): Payer: Medicaid Other

## 2021-07-15 ENCOUNTER — Other Ambulatory Visit: Payer: Self-pay

## 2021-07-15 DIAGNOSIS — I240 Acute coronary thrombosis not resulting in myocardial infarction: Secondary | ICD-10-CM | POA: Diagnosis not present

## 2021-07-15 DIAGNOSIS — Z7901 Long term (current) use of anticoagulants: Secondary | ICD-10-CM

## 2021-07-15 LAB — POCT INR: INR: 3.5 — AB (ref 2.0–3.0)

## 2021-07-15 MED ORDER — WARFARIN SODIUM 5 MG PO TABS: ORAL_TABLET | ORAL | 0 refills | Status: DC

## 2021-07-15 NOTE — Patient Instructions (Addendum)
Description   Skip tomorrow's dosage of Warfarin, then start taking 1.5 tablets daily except 1 tablet on Mondays, Wednesdays and Fridays.   Recheck in 2 weeks.

## 2021-08-03 ENCOUNTER — Telehealth: Payer: Self-pay

## 2021-08-03 NOTE — Telephone Encounter (Signed)
UNABLE TO LMOM FOR OVERDUE INR

## 2021-08-17 NOTE — Telephone Encounter (Signed)
Lmom for overdue inr 

## 2021-08-24 ENCOUNTER — Telehealth: Payer: Self-pay

## 2021-08-24 NOTE — Telephone Encounter (Signed)
Lmom to schedule overdue inr

## 2021-09-06 ENCOUNTER — Telehealth: Payer: Self-pay

## 2021-09-06 NOTE — Telephone Encounter (Signed)
Overdue for INR check, called and left message

## 2021-09-09 ENCOUNTER — Telehealth: Payer: Self-pay | Admitting: Cardiovascular Disease

## 2021-09-09 ENCOUNTER — Telehealth: Payer: Self-pay

## 2021-09-09 NOTE — Telephone Encounter (Signed)
Called spoke with pt, rescheduled [facility] missed appt on 09/09/21 to [facility] street office on Monday 09/12/21.

## 2021-09-09 NOTE — Telephone Encounter (Signed)
Pt states he was switched from the [facility] to [facility] and he's not sure why he was switched. Pt wants to know if he can continue at [address] because it's closer to his home and it's convenient because pt does not have transportation? Please advise pt further

## 2021-09-09 NOTE — Telephone Encounter (Signed)
Lpm to call and reschedule INR check at [facility].

## 2021-09-09 NOTE — Telephone Encounter (Signed)
Lpmtcb and reschedule INR at [facility].

## 2021-09-13 ENCOUNTER — Ambulatory Visit: Payer: Medicaid Other | Admitting: Orthopaedic Surgery

## 2021-09-16 ENCOUNTER — Telehealth: Payer: Self-pay | Admitting: Cardiovascular Disease

## 2021-09-16 ENCOUNTER — Other Ambulatory Visit: Payer: Self-pay

## 2021-09-16 MED ORDER — WARFARIN SODIUM 5 MG PO TABS: ORAL_TABLET | ORAL | 0 refills | Status: DC

## 2021-09-16 NOTE — Telephone Encounter (Signed)
*  STAT* If patient is at the pharmacy, call can be transferred to refill team.   1. Which medications need to be refilled? (please list name of each medication and dose if known) warfarin (COUMADIN) 5 MG tablet  2. Which pharmacy/location (including street and city if local pharmacy) is medication to be sent to? [facility] - [place], [place] - [address]  3. Do they need a 30 day or 90 day supply?  90 day supply   Only has 2 to 3 tablets left.

## 2021-09-26 ENCOUNTER — Ambulatory Visit (INDEPENDENT_AMBULATORY_CARE_PROVIDER_SITE_OTHER): Payer: Medicaid Other

## 2021-09-26 ENCOUNTER — Other Ambulatory Visit: Payer: Self-pay

## 2021-09-26 DIAGNOSIS — Z7901 Long term (current) use of anticoagulants: Secondary | ICD-10-CM

## 2021-09-26 DIAGNOSIS — I240 Acute coronary thrombosis not resulting in myocardial infarction: Secondary | ICD-10-CM | POA: Diagnosis not present

## 2021-09-26 DIAGNOSIS — Z5181 Encounter for therapeutic drug level monitoring: Secondary | ICD-10-CM

## 2021-09-26 LAB — POCT INR: INR: 1.6 — AB (ref 2.0–3.0)

## 2021-09-26 NOTE — Patient Instructions (Signed)
-   take 1.5 tablets warfarin tonight, then  - continue taking 1.5 tablets daily except 1 tablet on Mondays, Wednesdays and Fridays.    - Recheck in 2 weeks.

## 2021-10-20 ENCOUNTER — Telehealth: Payer: Self-pay

## 2021-10-20 NOTE — Telephone Encounter (Signed)
Lpm to call and schedule INR check at his preference of [facility] Facility.

## 2021-11-02 ENCOUNTER — Telehealth: Payer: Self-pay

## 2021-11-02 NOTE — Telephone Encounter (Signed)
INR overdue. Called, no answer. Unable to leave voicemail.  

## 2021-11-04 ENCOUNTER — Telehealth: Payer: Self-pay | Admitting: Cardiovascular Disease

## 2021-11-04 MED ORDER — WARFARIN SODIUM 5 MG PO TABS: ORAL_TABLET | ORAL | 0 refills | Status: DC

## 2021-11-04 NOTE — Telephone Encounter (Signed)
*  STAT* If patient is at the pharmacy, call can be transferred to refill team.   1. Which medications need to be refilled? (please list name of each medication and dose if known) Warfarin  2. Which pharmacy/location (including street and city if local pharmacy) is medication to be sent to? [facility]  3. Do they need a 30 day or 90 day supply? 90 days and refills

## 2021-11-04 NOTE — Telephone Encounter (Signed)
Prescription refill request received for warfarin Lov: 03/09/21 Allyson Sabal)  Next INR check: 11/07/21  Warfarin tablet strength: 5mg    Appropriate dose and refill sent to requested pharmacy.

## 2021-11-07 ENCOUNTER — Ambulatory Visit (INDEPENDENT_AMBULATORY_CARE_PROVIDER_SITE_OTHER): Payer: Medicaid Other

## 2021-11-07 ENCOUNTER — Other Ambulatory Visit: Payer: Self-pay

## 2021-11-07 DIAGNOSIS — Z7901 Long term (current) use of anticoagulants: Secondary | ICD-10-CM

## 2021-11-07 DIAGNOSIS — I240 Acute coronary thrombosis not resulting in myocardial infarction: Secondary | ICD-10-CM | POA: Diagnosis not present

## 2021-11-07 LAB — POCT INR: INR: 1.1 — AB (ref 2.0–3.0)

## 2021-11-07 NOTE — Patient Instructions (Signed)
Description   Take 2 tablets today and tomorrow, then resume same dosage of Warfarin 1.5 tablets daily except 1 tablet on Mondays, Wednesdays and Fridays.  Recheck in 1 week.

## 2021-11-14 ENCOUNTER — Ambulatory Visit (INDEPENDENT_AMBULATORY_CARE_PROVIDER_SITE_OTHER): Payer: Medicaid Other | Admitting: *Deleted

## 2021-11-14 ENCOUNTER — Other Ambulatory Visit: Payer: Self-pay

## 2021-11-14 DIAGNOSIS — I240 Acute coronary thrombosis not resulting in myocardial infarction: Secondary | ICD-10-CM | POA: Diagnosis not present

## 2021-11-14 DIAGNOSIS — Z5181 Encounter for therapeutic drug level monitoring: Secondary | ICD-10-CM

## 2021-11-14 LAB — POCT INR: INR: 2.2 (ref 2.0–3.0)

## 2021-11-14 NOTE — Patient Instructions (Signed)
Description   Continue same dosage of Warfarin 1.5 tablets daily except 1 tablet on Mondays, Wednesdays and Fridays.  Recheck in 1 week.

## 2021-11-25 ENCOUNTER — Ambulatory Visit (INDEPENDENT_AMBULATORY_CARE_PROVIDER_SITE_OTHER): Payer: Medicaid Other | Admitting: *Deleted

## 2021-11-25 ENCOUNTER — Other Ambulatory Visit: Payer: Self-pay

## 2021-11-25 DIAGNOSIS — Z7901 Long term (current) use of anticoagulants: Secondary | ICD-10-CM

## 2021-11-25 DIAGNOSIS — I240 Acute coronary thrombosis not resulting in myocardial infarction: Secondary | ICD-10-CM | POA: Diagnosis not present

## 2021-11-25 LAB — POCT INR: INR: 3.6 — AB (ref 2.0–3.0)

## 2021-11-25 MED ORDER — WARFARIN SODIUM 5 MG PO TABS: ORAL_TABLET | ORAL | 0 refills | Status: DC

## 2021-11-25 NOTE — Patient Instructions (Addendum)
Description   Do not take any Warfarin tomorrow then continue same dosage of Warfarin 1.5 tablets daily except 1 tablet on Mondays, Wednesdays and Fridays. Recheck in 2 weeks. Coumadin Clinic [phone].   [place] [id number]

## 2022-01-05 ENCOUNTER — Telehealth: Payer: Self-pay

## 2022-01-05 DIAGNOSIS — Z7901 Long term (current) use of anticoagulants: Secondary | ICD-10-CM

## 2022-01-05 DIAGNOSIS — I240 Acute coronary thrombosis not resulting in myocardial infarction: Secondary | ICD-10-CM

## 2022-01-05 MED ORDER — WARFARIN SODIUM 5 MG PO TABS: ORAL_TABLET | ORAL | 0 refills | Status: DC

## 2022-01-05 NOTE — Telephone Encounter (Signed)
Called pt, he is overdue for INR check in Coumadin clinic.  Pt missed his last appt on 12/09/21.  Made pt appt on 01/09/22 at 1:45pm.  Pt states he is out of Warfarin, needs rx refill sent to [facility] pharmacy.  Advised pt we cannot send in a 30 day supply until his INR is checked, but I will send him in a 1 week supply to get him to his upcoming appt.

## 2022-01-09 ENCOUNTER — Telehealth: Payer: Self-pay | Admitting: *Deleted

## 2022-01-09 NOTE — Telephone Encounter (Signed)
Called pt since he has missed another appt to have his INR monitored. Pt has had many NO SHOWS since coming to [facility] location and was placed on [facility] schedule by satellite office in the past. Pt has been advised multiple times regarding adherence to appts. However, called pt and left a message for him to call back to reschedule.

## 2022-01-16 ENCOUNTER — Other Ambulatory Visit: Payer: Self-pay | Admitting: Cardiovascular Disease

## 2022-01-16 DIAGNOSIS — Z7901 Long term (current) use of anticoagulants: Secondary | ICD-10-CM

## 2022-01-16 DIAGNOSIS — I240 Acute coronary thrombosis not resulting in myocardial infarction: Secondary | ICD-10-CM

## 2022-02-20 ENCOUNTER — Telehealth: Payer: Self-pay | Admitting: Cardiovascular Disease

## 2022-02-20 NOTE — Telephone Encounter (Signed)
?*  STAT* If patient is at the pharmacy, call can be transferred to refill team. ? ? ?1. Which medications need to be refilled? (please list name of each medication and dose if known) warfarin (COUMADIN) 5 MG tablet ? ?2. Which pharmacy/location (including street and city if local pharmacy) is medication to be sent to? [facility] - [place], [place] - [address] ? ?3. Do they need a 30 day or 90 day supply? 90 ? ?

## 2022-02-20 NOTE — Telephone Encounter (Signed)
Routed to pharmacy team 

## 2022-02-21 ENCOUNTER — Ambulatory Visit (INDEPENDENT_AMBULATORY_CARE_PROVIDER_SITE_OTHER): Payer: Medicaid Other | Admitting: *Deleted

## 2022-02-21 ENCOUNTER — Other Ambulatory Visit: Payer: Self-pay | Admitting: *Deleted

## 2022-02-21 ENCOUNTER — Other Ambulatory Visit: Payer: Self-pay

## 2022-02-21 DIAGNOSIS — Z7901 Long term (current) use of anticoagulants: Secondary | ICD-10-CM

## 2022-02-21 DIAGNOSIS — I240 Acute coronary thrombosis not resulting in myocardial infarction: Secondary | ICD-10-CM | POA: Diagnosis not present

## 2022-02-21 LAB — POCT INR: INR: 1.1 — AB (ref 2.0–3.0)

## 2022-02-21 MED ORDER — WARFARIN SODIUM 5 MG PO TABS: ORAL_TABLET | ORAL | 0 refills | Status: DC

## 2022-02-21 NOTE — Patient Instructions (Addendum)
Description   ?Today take 2 tablets and tomorrow take 1.5 tablets then continue taking Warfarin 1.5 tablets daily except 1 tablet on Mondays, Wednesdays and Fridays. Do not miss anymore medications. Recheck in 1 week at [facility] location. It is very important to keep appointment and take your medications. Call when you hear from PCP regarding managing levels and dosing warfarin: Coumadin Clinic [phone].  ? ?BILL [id number] ?  ?  ? ?

## 2022-03-06 ENCOUNTER — Telehealth: Payer: Self-pay

## 2022-03-06 NOTE — Telephone Encounter (Signed)
Lmom to r/s missed appt  

## 2022-04-07 ENCOUNTER — Telehealth: Payer: Self-pay | Admitting: Cardiovascular Disease

## 2022-04-07 ENCOUNTER — Other Ambulatory Visit: Payer: Self-pay

## 2022-04-07 DIAGNOSIS — I240 Acute coronary thrombosis not resulting in myocardial infarction: Secondary | ICD-10-CM

## 2022-04-07 DIAGNOSIS — Z7901 Long term (current) use of anticoagulants: Secondary | ICD-10-CM

## 2022-04-07 MED ORDER — WARFARIN SODIUM 5 MG PO TABS: ORAL_TABLET | ORAL | 0 refills | Status: AC

## 2022-04-07 NOTE — Telephone Encounter (Signed)
?*  STAT* If patient is at the pharmacy, call can be transferred to refill team. ? ? ?1. Which medications need to be refilled? (please list name of each medication and dose if known)  ? warfarin (COUMADIN) 5 MG tablet  ? ? ?2. Which pharmacy/location (including street and city if local pharmacy) is medication to be sent to?  ?[facility] - [place], [place] - [address] ?3. Do they need a 30 day or 90 day supply?  ?90 day ? ?

## 2022-04-25 ENCOUNTER — Ambulatory Visit: Payer: Medicaid Other | Admitting: Cardiovascular Disease

## 2022-05-02 ENCOUNTER — Encounter: Payer: Self-pay | Admitting: Cardiovascular Disease

## 2022-05-03 ENCOUNTER — Other Ambulatory Visit: Payer: Self-pay | Admitting: Cardiovascular Disease

## 2022-05-03 DIAGNOSIS — Z7901 Long term (current) use of anticoagulants: Secondary | ICD-10-CM

## 2022-05-03 DIAGNOSIS — I240 Acute coronary thrombosis not resulting in myocardial infarction: Secondary | ICD-10-CM

## 2022-05-03 NOTE — Telephone Encounter (Signed)
*  STAT* If patient is at the pharmacy, call can be transferred to refill team.   1. Which medications need to be refilled? (please list name of each medication and dose if known) warfarin (COUMADIN) 5 MG tablet digoxin (LANOXIN) 0.125 MG tablet furosemide (LASIX) 40 MG tablet  2. Which pharmacy/location (including street and city if local pharmacy) is medication to be sent to? [facility] - [place], [place] - [address]  3. Do they need a 30 day or 90 day supply? 30 day

## 2022-05-08 ENCOUNTER — Other Ambulatory Visit: Payer: Self-pay | Admitting: Cardiovascular Disease

## 2022-05-08 DIAGNOSIS — Z7901 Long term (current) use of anticoagulants: Secondary | ICD-10-CM

## 2022-05-08 DIAGNOSIS — I240 Acute coronary thrombosis not resulting in myocardial infarction: Secondary | ICD-10-CM

## 2022-05-08 NOTE — Telephone Encounter (Signed)
Patient is out of medication

## 2022-05-09 MED ORDER — DIGOXIN 125 MCG PO TABS: 0.1250 mg | ORAL_TABLET | Freq: Every day | ORAL | 3 refills | Status: AC

## 2022-05-09 MED ORDER — FUROSEMIDE 40 MG PO TABS: 20.0000 mg | ORAL_TABLET | Freq: Every day | ORAL | 3 refills | Status: AC

## 2022-05-09 NOTE — Telephone Encounter (Signed)
Patient is calling back refill was sent. Patient is out of medication. Please advise

## 2022-06-13 ENCOUNTER — Ambulatory Visit: Payer: Medicaid Other | Admitting: Cardiovascular Disease

## 2022-06-15 ENCOUNTER — Encounter: Payer: Medicaid Other | Admitting: Physician Assistant

## 2022-06-15 NOTE — Progress Notes (Signed)
This encounter was created in error - please disregard.

## 2022-06-19 ENCOUNTER — Emergency Department: Payer: Medicaid Other

## 2022-06-19 ENCOUNTER — Encounter: Payer: Self-pay | Admitting: Cardiovascular Disease

## 2022-06-19 ENCOUNTER — Encounter: Payer: Self-pay | Admitting: Physician Assistant

## 2022-06-19 ENCOUNTER — Inpatient Hospital Stay: Admit: 2022-06-19 | Payer: Medicaid Other

## 2022-06-19 DIAGNOSIS — J9602 Acute respiratory failure with hypercapnia: Principal | ICD-10-CM | POA: Diagnosis present

## 2022-06-19 DIAGNOSIS — N189 Chronic kidney disease, unspecified: Secondary | ICD-10-CM | POA: Diagnosis present

## 2022-06-19 DIAGNOSIS — Z8673 Personal history of transient ischemic attack (TIA), and cerebral infarction without residual deficits: Secondary | ICD-10-CM

## 2022-06-19 DIAGNOSIS — F4310 Post-traumatic stress disorder, unspecified: Secondary | ICD-10-CM | POA: Diagnosis present

## 2022-06-19 DIAGNOSIS — G9341 Metabolic encephalopathy: Secondary | ICD-10-CM | POA: Diagnosis present

## 2022-06-19 DIAGNOSIS — Z8701 Personal history of pneumonia (recurrent): Secondary | ICD-10-CM | POA: Diagnosis not present

## 2022-06-19 DIAGNOSIS — I5023 Acute on chronic systolic (congestive) heart failure: Secondary | ICD-10-CM | POA: Diagnosis present

## 2022-06-19 DIAGNOSIS — I469 Cardiac arrest, cause unspecified: Secondary | ICD-10-CM | POA: Diagnosis not present

## 2022-06-19 DIAGNOSIS — M1611 Unilateral primary osteoarthritis, right hip: Secondary | ICD-10-CM | POA: Diagnosis present

## 2022-06-19 DIAGNOSIS — J810 Acute pulmonary edema: Secondary | ICD-10-CM | POA: Diagnosis not present

## 2022-06-19 DIAGNOSIS — J9601 Acute respiratory failure with hypoxia: Secondary | ICD-10-CM | POA: Diagnosis present

## 2022-06-19 DIAGNOSIS — F17210 Nicotine dependence, cigarettes, uncomplicated: Secondary | ICD-10-CM | POA: Diagnosis present

## 2022-06-19 DIAGNOSIS — I255 Ischemic cardiomyopathy: Secondary | ICD-10-CM | POA: Diagnosis present

## 2022-06-19 DIAGNOSIS — R7989 Other specified abnormal findings of blood chemistry: Secondary | ICD-10-CM | POA: Diagnosis present

## 2022-06-19 DIAGNOSIS — I214 Non-ST elevation (NSTEMI) myocardial infarction: Principal | ICD-10-CM | POA: Diagnosis present

## 2022-06-19 DIAGNOSIS — M545 Low back pain, unspecified: Secondary | ICD-10-CM | POA: Diagnosis present

## 2022-06-19 DIAGNOSIS — Z7901 Long term (current) use of anticoagulants: Secondary | ICD-10-CM

## 2022-06-19 DIAGNOSIS — I130 Hypertensive heart and chronic kidney disease with heart failure and stage 1 through stage 4 chronic kidney disease, or unspecified chronic kidney disease: Secondary | ICD-10-CM | POA: Diagnosis present

## 2022-06-19 DIAGNOSIS — E876 Hypokalemia: Secondary | ICD-10-CM | POA: Diagnosis present

## 2022-06-19 DIAGNOSIS — F319 Bipolar disorder, unspecified: Secondary | ICD-10-CM | POA: Diagnosis present

## 2022-06-19 DIAGNOSIS — R739 Hyperglycemia, unspecified: Secondary | ICD-10-CM | POA: Diagnosis present

## 2022-06-19 DIAGNOSIS — R079 Chest pain, unspecified: Secondary | ICD-10-CM | POA: Diagnosis present

## 2022-06-19 DIAGNOSIS — R570 Cardiogenic shock: Secondary | ICD-10-CM | POA: Diagnosis not present

## 2022-06-19 DIAGNOSIS — Z7982 Long term (current) use of aspirin: Secondary | ICD-10-CM

## 2022-06-19 DIAGNOSIS — I493 Ventricular premature depolarization: Secondary | ICD-10-CM | POA: Diagnosis present

## 2022-06-19 DIAGNOSIS — N179 Acute kidney failure, unspecified: Secondary | ICD-10-CM | POA: Diagnosis present

## 2022-06-19 DIAGNOSIS — Z66 Do not resuscitate: Secondary | ICD-10-CM | POA: Diagnosis not present

## 2022-06-19 DIAGNOSIS — E7800 Pure hypercholesterolemia, unspecified: Secondary | ICD-10-CM | POA: Diagnosis present

## 2022-06-19 DIAGNOSIS — Z79899 Other long term (current) drug therapy: Secondary | ICD-10-CM

## 2022-06-19 DIAGNOSIS — Z888 Allergy status to other drugs, medicaments and biological substances status: Secondary | ICD-10-CM

## 2022-06-19 DIAGNOSIS — I4510 Unspecified right bundle-branch block: Secondary | ICD-10-CM | POA: Diagnosis present

## 2022-06-19 DIAGNOSIS — E8729 Other acidosis: Secondary | ICD-10-CM | POA: Diagnosis present

## 2022-06-19 DIAGNOSIS — E872 Acidosis, unspecified: Secondary | ICD-10-CM | POA: Diagnosis present

## 2022-06-19 DIAGNOSIS — G8929 Other chronic pain: Secondary | ICD-10-CM | POA: Diagnosis present

## 2022-06-19 DIAGNOSIS — I2510 Atherosclerotic heart disease of native coronary artery without angina pectoris: Secondary | ICD-10-CM | POA: Diagnosis present

## 2022-06-19 DIAGNOSIS — I5021 Acute systolic (congestive) heart failure: Secondary | ICD-10-CM | POA: Diagnosis not present

## 2022-06-19 DIAGNOSIS — Z91148 Patient's other noncompliance with medication regimen for other reason: Secondary | ICD-10-CM

## 2022-06-19 LAB — URINALYSIS, COMPLETE (UACMP) WITH MICROSCOPIC
Bilirubin Urine: NEGATIVE
Glucose, UA: NEGATIVE mg/dL
Ketones, ur: NEGATIVE mg/dL
Leukocytes,Ua: NEGATIVE
Nitrite: NEGATIVE
Protein, ur: 100 mg/dL — AB
Specific Gravity, Urine: 1.023 (ref 1.005–1.030)
pH: 5 (ref 5.0–8.0)

## 2022-06-19 LAB — BLOOD GAS, VENOUS
Acid-base deficit: 3.5 mmol/L — ABNORMAL HIGH (ref 0.0–2.0)
Bicarbonate: 25.4 mmol/L (ref 20.0–28.0)
O2 Saturation: 13.5 %
Patient temperature: 37
pCO2, Ven: 62 mmHg — ABNORMAL HIGH (ref 44–60)
pH, Ven: 7.22 — ABNORMAL LOW (ref 7.25–7.43)
pO2, Ven: <31 mmHg — CL (ref 32–45)

## 2022-06-19 LAB — BLOOD GAS, ARTERIAL
Acid-base deficit: 13.3 mmol/L — ABNORMAL HIGH (ref 0.0–2.0)
Acid-base deficit: 11 mmol/L — ABNORMAL HIGH (ref 0.0–2.0)
Bicarbonate: 17.6 mmol/L — ABNORMAL LOW (ref 20.0–28.0)
Bicarbonate: 16.4 mmol/L — ABNORMAL LOW (ref 20.0–28.0)
FIO2: 100 %
FIO2: 100 %
MECHVT: 450 mL
MECHVT: 500 mL
Mechanical Rate: 24
O2 Saturation: 98.9 %
O2 Saturation: 100 %
PEEP: 12 cmH2O
PEEP: 12 cmH2O
Patient temperature: 37
Patient temperature: 37
RATE: 28 resp/min
pCO2 arterial: 62 mmHg — ABNORMAL HIGH (ref 32–48)
pCO2 arterial: 41 mmHg (ref 32–48)
pH, Arterial: 7.06 — CL (ref 7.35–7.45)
pH, Arterial: 7.21 — ABNORMAL LOW (ref 7.35–7.45)
pO2, Arterial: 114 mmHg — ABNORMAL HIGH (ref 83–108)
pO2, Arterial: 154 mmHg — ABNORMAL HIGH (ref 83–108)

## 2022-06-19 LAB — BASIC METABOLIC PANEL
Anion gap: 12 (ref 5–15)
BUN: 11 mg/dL (ref 8–23)
CO2: 22 mmol/L (ref 22–32)
Calcium: 8.6 mg/dL — ABNORMAL LOW (ref 8.9–10.3)
Chloride: 106 mmol/L (ref 98–111)
Creatinine, Ser: 1.13 mg/dL (ref 0.61–1.24)
GFR, Estimated: >60 mL/min (ref >60)
Glucose, Bld: 162 mg/dL — ABNORMAL HIGH (ref 70–99)
Potassium: 3.3 mmol/L — ABNORMAL LOW (ref 3.5–5.1)
Sodium: 140 mmol/L (ref 135–145)

## 2022-06-19 LAB — TROPONIN I (HIGH SENSITIVITY)
Troponin I (High Sensitivity): 516 ng/L (ref <18)
Troponin I (High Sensitivity): 2532 ng/L (ref <18)
Troponin I (High Sensitivity): >24000 ng/L (ref <18)

## 2022-06-19 LAB — CBC
HCT: 39.2 % (ref 39.0–52.0)
Hemoglobin: 11.9 g/dL — ABNORMAL LOW (ref 13.0–17.0)
MCH: 30.2 pg (ref 26.0–34.0)
MCHC: 30.4 g/dL (ref 30.0–36.0)
MCV: 99.5 fL (ref 80.0–100.0)
Platelets: 187 10*3/uL (ref 150–400)
RBC: 3.94 MIL/uL — ABNORMAL LOW (ref 4.22–5.81)
RDW: 14.9 % (ref 11.5–15.5)
WBC: 6.4 10*3/uL (ref 4.0–10.5)
nRBC: 0 % (ref 0.0–0.2)

## 2022-06-19 LAB — URINE DRUG SCREEN, QUALITATIVE (ARMC ONLY)
Amphetamines, Ur Screen: NOT DETECTED
Barbiturates, Ur Screen: NOT DETECTED
Benzodiazepine, Ur Scrn: POSITIVE — AB
Cannabinoid 50 Ng, Ur ~~LOC~~: NOT DETECTED
Cocaine Metabolite,Ur ~~LOC~~: NOT DETECTED
MDMA (Ecstasy)Ur Screen: NOT DETECTED
Methadone Scn, Ur: NOT DETECTED
Opiate, Ur Screen: POSITIVE — AB
Phencyclidine (PCP) Ur S: NOT DETECTED
Tricyclic, Ur Screen: NOT DETECTED

## 2022-06-19 LAB — HEPARIN LEVEL (UNFRACTIONATED): Heparin Unfractionated: 0.97 IU/mL — ABNORMAL HIGH (ref 0.30–0.70)

## 2022-06-19 LAB — GLUCOSE, CAPILLARY: Glucose-Capillary: 142 mg/dL — ABNORMAL HIGH (ref 70–99)

## 2022-06-19 LAB — HEPATIC FUNCTION PANEL
ALT: 25 U/L (ref 0–44)
AST: 42 U/L — ABNORMAL HIGH (ref 15–41)
Albumin: 3.5 g/dL (ref 3.5–5.0)
Alkaline Phosphatase: 53 U/L (ref 38–126)
Bilirubin, Direct: 0.2 mg/dL (ref 0.0–0.2)
Indirect Bilirubin: 0.5 mg/dL (ref 0.3–0.9)
Total Bilirubin: 0.7 mg/dL (ref 0.3–1.2)
Total Protein: 6.7 g/dL (ref 6.5–8.1)

## 2022-06-19 LAB — PROTIME-INR
INR: 1.2 (ref 0.8–1.2)
Prothrombin Time: 14.9 seconds (ref 11.4–15.2)

## 2022-06-19 LAB — PHOSPHORUS: Phosphorus: 8.1 mg/dL — ABNORMAL HIGH (ref 2.5–4.6)

## 2022-06-19 LAB — MAGNESIUM: Magnesium: 2.2 mg/dL (ref 1.7–2.4)

## 2022-06-19 LAB — MRSA NEXT GEN BY PCR, NASAL: MRSA by PCR Next Gen: NOT DETECTED

## 2022-06-19 LAB — LACTIC ACID, PLASMA
Lactic Acid, Venous: 7.4 mmol/L (ref 0.5–1.9)
Lactic Acid, Venous: 5.5 mmol/L (ref 0.5–1.9)
Lactic Acid, Venous: 4 mmol/L (ref 0.5–1.9)

## 2022-06-19 LAB — BRAIN NATRIURETIC PEPTIDE: B Natriuretic Peptide: 361.1 pg/mL — ABNORMAL HIGH (ref 0.0–100.0)

## 2022-06-19 LAB — DIGOXIN LEVEL: Digoxin Level: <0.2 ng/mL — ABNORMAL LOW (ref 0.8–2.0)

## 2022-06-19 LAB — PROCALCITONIN: Procalcitonin: <0.1 ng/mL

## 2022-06-19 LAB — HIV ANTIBODY (ROUTINE TESTING W REFLEX): HIV Screen 4th Generation wRfx: NONREACTIVE

## 2022-06-19 MED ORDER — MIDAZOLAM HCL 2 MG/2ML IJ SOLN: 2.0000 mg | INTRAMUSCULAR | Status: DC | PRN

## 2022-06-19 MED ORDER — ETOMIDATE 2 MG/ML IV SOLN
INTRAVENOUS | Status: AC
  Administered 2022-06-19: 20 mg
  Filled 2022-06-19: qty 10

## 2022-06-19 MED ORDER — MIDAZOLAM HCL 2 MG/2ML IJ SOLN
4.0000 mg | INTRAMUSCULAR | Status: DC | PRN
Start: 2022-06-19 — End: 2022-06-19
  Administered 2022-06-19: 4 mg via INTRAVENOUS
  Filled 2022-06-19: qty 4

## 2022-06-19 MED ORDER — ROCURONIUM BROMIDE 10 MG/ML (PF) SYRINGE
PREFILLED_SYRINGE | INTRAVENOUS | Status: AC
  Administered 2022-06-19: 70 mg
  Filled 2022-06-19: qty 10

## 2022-06-19 MED ORDER — NALOXONE HCL 2 MG/2ML IJ SOSY: 1.0000 mg | PREFILLED_SYRINGE | Freq: Once | INTRAMUSCULAR | Status: AC

## 2022-06-19 MED ORDER — HEPARIN BOLUS VIA INFUSION
3900.0000 [IU] | Freq: Once | INTRAVENOUS | Status: DC
  Filled 2022-06-19: qty 3900

## 2022-06-19 MED ORDER — ETOMIDATE 2 MG/ML IV SOLN
INTRAVENOUS | Status: AC
  Filled 2022-06-19: qty 10

## 2022-06-19 MED ORDER — NOREPINEPHRINE 4 MG/250ML-% IV SOLN
0.0000 ug/min | INTRAVENOUS | Status: DC
  Administered 2022-06-19: 4 ug/min via INTRAVENOUS
  Filled 2022-06-19 (×2): qty 250

## 2022-06-19 MED ORDER — HEPARIN (PORCINE) 25000 UT/250ML-% IV SOLN
850.0000 [IU]/h | INTRAVENOUS | Status: DC
Start: 2022-06-19 — End: 2022-06-21
  Administered 2022-06-19: 800 [IU]/h via INTRAVENOUS
  Filled 2022-06-19: qty 250

## 2022-06-19 MED ORDER — SODIUM BICARBONATE 8.4 % IV SOLN
100.0000 meq | Freq: Once | INTRAVENOUS | Status: AC
  Administered 2022-06-19: 100 meq via INTRAVENOUS
  Filled 2022-06-19: qty 100

## 2022-06-19 MED ORDER — POTASSIUM CHLORIDE 10 MEQ/100ML IV SOLN
10.0000 meq | INTRAVENOUS | Status: DC
  Administered 2022-06-19: 10 meq via INTRAVENOUS
  Filled 2022-06-19 (×2): qty 100

## 2022-06-19 MED ORDER — MIDAZOLAM BOLUS VIA INFUSION: 2.0000 mg | INTRAVENOUS | Status: DC | PRN

## 2022-06-19 MED ORDER — DEXMEDETOMIDINE HCL IN NACL 400 MCG/100ML IV SOLN
0.4000 ug/kg/h | INTRAVENOUS | Status: DC
  Administered 2022-06-19: 0.4 ug/kg/h via INTRAVENOUS
  Filled 2022-06-19: qty 100

## 2022-06-19 MED ORDER — VECURONIUM BROMIDE 10 MG IV SOLR
10.0000 mg | INTRAVENOUS | Status: DC | PRN
  Administered 2022-06-19 – 2022-06-20 (×3): 10 mg via INTRAVENOUS
  Filled 2022-06-19 (×3): qty 10

## 2022-06-19 MED ORDER — DOCUSATE SODIUM 50 MG/5ML PO LIQD
100.0000 mg | Freq: Two times a day (BID) | ORAL | Status: DC
Start: 2022-06-19 — End: 2022-06-21
  Administered 2022-06-19: 100 mg
  Filled 2022-06-19 (×2): qty 10

## 2022-06-19 MED ORDER — NOREPINEPHRINE 4 MG/250ML-% IV SOLN
INTRAVENOUS | Status: AC
  Filled 2022-06-19: qty 250

## 2022-06-19 MED ORDER — FENTANYL CITRATE PF 50 MCG/ML IJ SOSY: 50.0000 ug | PREFILLED_SYRINGE | INTRAMUSCULAR | Status: DC | PRN

## 2022-06-19 MED ORDER — MIDAZOLAM-SODIUM CHLORIDE 100-0.9 MG/100ML-% IV SOLN
0.5000 mg/h | INTRAVENOUS | Status: DC
  Administered 2022-06-19: 0.5 mg/h via INTRAVENOUS
  Administered 2022-06-20: 7 mg/h via INTRAVENOUS
  Filled 2022-06-19 (×2): qty 100

## 2022-06-19 MED ORDER — POLYETHYLENE GLYCOL 3350 17 G PO PACK: 17.0000 g | PACK | Freq: Every day | ORAL | Status: DC | PRN

## 2022-06-19 MED ORDER — NOREPINEPHRINE 16 MG/250ML-% IV SOLN
0.0000 ug/min | INTRAVENOUS | Status: DC
  Administered 2022-06-19: 20 ug/min via INTRAVENOUS
  Administered 2022-06-20: 85 ug/min via INTRAVENOUS
  Administered 2022-06-20: 350 ug/min via INTRAVENOUS
  Administered 2022-06-20: 22 ug/min via INTRAVENOUS
  Filled 2022-06-19 (×6): qty 250

## 2022-06-19 MED ORDER — DOCUSATE SODIUM 50 MG/5ML PO LIQD: 100.0000 mg | Freq: Two times a day (BID) | ORAL | Status: DC | PRN

## 2022-06-19 MED ORDER — ONDANSETRON HCL 4 MG/2ML IJ SOLN: 4.0000 mg | Freq: Four times a day (QID) | INTRAMUSCULAR | Status: DC | PRN

## 2022-06-19 MED ORDER — POLYETHYLENE GLYCOL 3350 17 G PO PACK: 17.0000 g | PACK | Freq: Every day | ORAL | Status: DC

## 2022-06-19 MED ORDER — VASOPRESSIN 20 UNITS/100 ML INFUSION FOR SHOCK
0.0000 [IU]/min | INTRAVENOUS | Status: DC
  Administered 2022-06-19 – 2022-06-20 (×2): 0.03 [IU]/min via INTRAVENOUS
  Administered 2022-06-20: 0.04 [IU]/min via INTRAVENOUS
  Filled 2022-06-19 (×4): qty 100

## 2022-06-19 MED ORDER — HEPARIN BOLUS VIA INFUSION
4000.0000 [IU] | Freq: Once | INTRAVENOUS | Status: AC
Start: 2022-06-19 — End: 2022-06-19
  Administered 2022-06-19: 4000 [IU] via INTRAVENOUS
  Filled 2022-06-19: qty 4000

## 2022-06-19 MED ORDER — LORAZEPAM 2 MG/ML IJ SOLN
2.0000 mg | INTRAMUSCULAR | Status: DC | PRN
Start: 2022-06-19 — End: 2022-06-19
  Filled 2022-06-19: qty 1

## 2022-06-19 MED ORDER — FENTANYL 2500MCG IN NS 250ML (10MCG/ML) PREMIX INFUSION
50.0000 ug/h | INTRAVENOUS | Status: DC
  Administered 2022-06-19 (×2): 50 ug/h via INTRAVENOUS
  Administered 2022-06-20 (×2): 200 ug/h via INTRAVENOUS
  Filled 2022-06-19 (×3): qty 250

## 2022-06-19 MED ORDER — NOREPINEPHRINE 4 MG/250ML-% IV SOLN: 0.0000 ug/min | INTRAVENOUS | Status: DC

## 2022-06-19 MED ORDER — MIDAZOLAM HCL 2 MG/2ML IJ SOLN
2.0000 mg | INTRAMUSCULAR | Status: DC | PRN
  Administered 2022-06-19: 2 mg via INTRAVENOUS
  Filled 2022-06-19: qty 2

## 2022-06-19 MED ORDER — POTASSIUM CHLORIDE 20 MEQ PO PACK
20.0000 meq | PACK | Freq: Once | ORAL | Status: AC
  Administered 2022-06-19: 20 meq
  Filled 2022-06-19: qty 1

## 2022-06-19 MED ORDER — SODIUM CHLORIDE 0.9 % IV SOLN: 250.0000 mL | INTRAVENOUS | Status: DC

## 2022-06-19 MED ORDER — FUROSEMIDE 10 MG/ML IJ SOLN
80.0000 mg | Freq: Once | INTRAMUSCULAR | Status: AC
  Administered 2022-06-19: 80 mg via INTRAVENOUS
  Filled 2022-06-19: qty 8

## 2022-06-19 MED ORDER — SODIUM CHLORIDE 0.9 % IV BOLUS
1000.0000 mL | Freq: Once | INTRAVENOUS | Status: AC
  Administered 2022-06-19: 1000 mL via INTRAVENOUS

## 2022-06-19 MED ORDER — PANTOPRAZOLE SODIUM 40 MG IV SOLR
40.0000 mg | Freq: Every day | INTRAVENOUS | Status: DC
  Administered 2022-06-19: 40 mg via INTRAVENOUS
  Filled 2022-06-19: qty 10

## 2022-06-19 MED ORDER — NALOXONE HCL 2 MG/2ML IJ SOSY: 0.4000 mg | PREFILLED_SYRINGE | Freq: Once | INTRAMUSCULAR | Status: DC

## 2022-06-19 MED ORDER — FENTANYL BOLUS VIA INFUSION: 50.0000 ug | INTRAVENOUS | Status: DC | PRN

## 2022-06-19 MED ORDER — ENOXAPARIN SODIUM 40 MG/0.4ML IJ SOSY: 40.0000 mg | PREFILLED_SYRINGE | INTRAMUSCULAR | Status: DC

## 2022-06-19 MED ORDER — NALOXONE HCL 2 MG/2ML IJ SOSY
PREFILLED_SYRINGE | INTRAMUSCULAR | Status: AC
  Administered 2022-06-19: 1 mg via INTRAVENOUS
  Filled 2022-06-19: qty 2

## 2022-06-19 MED ORDER — INSULIN ASPART 100 UNIT/ML IJ SOLN
0.0000 [IU] | INTRAMUSCULAR | Status: DC
  Administered 2022-06-19: 2 [IU] via SUBCUTANEOUS
  Filled 2022-06-19: qty 1

## 2022-06-19 NOTE — ED Notes (Signed)
Pt grunting in pain and discomfort

## 2022-06-19 NOTE — ED Notes (Signed)
Informed RN bed assigned 

## 2022-06-19 NOTE — ED Notes (Signed)
MD jessup at bedside preparing to intubate patient

## 2022-06-19 NOTE — ED Notes (Signed)
Pt vomitting stating "I cannot breathe"

## 2022-06-19 NOTE — ED Notes (Signed)
Pt placed on bipap intermittently

## 2022-06-19 NOTE — ED Notes (Signed)
MD Larinda Buttery, RT and Danella Maiers RN and Colin Mulders RN at bedside.

## 2022-06-19 NOTE — ED Triage Notes (Signed)
Pt arrives today with EMS from worksite with hypotension, near syncope and chest pain today. Pt was at work and was attempting to open a gate when he developed these symptoms.

## 2022-06-19 NOTE — Progress Notes (Signed)
ANTICOAGULATION CONSULT NOTE - Initial Consult  Pharmacy Consult for Heparin Drip Indication:  ACS/STEMI  Allergies  Allergen Reactions   Methocarbamol Nausea And Vomiting and Anxiety    Patient Measurements: Height: 5\' 9"  (175.3 cm) Weight: 66.3 kg (146 lb 2.6 oz) IBW/kg (Calculated) : 70.7 Heparin Dosing Weight: 66.3 kg  Vital Signs: Temp: 97.9 F (36.6 C) (07/17 1850) Temp Source: Bladder (07/17 1850) BP: 115/81 (07/17 1730) Pulse Rate: 95 (07/17 1850)  Labs: Recent Labs    July 14, 2022 1202 07-14-2022 1600 Jul 14, 2022 2003  HGB 11.9*  --   --   HCT 39.2  --   --   PLT 187  --   --   LABPROT 14.9  --   --   INR 1.2  --   --   HEPARINUNFRC  --   --  0.97*  CREATININE 1.13  --   --   TROPONINIHS 516* 2,532* >24,000*     Estimated Creatinine Clearance: 62.7 mL/min (by C-G formula based on SCr of 1.13 mg/dL).   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 Pharmacy consulted for Heparin dosing.  Goal of Therapy:  Heparin level 0.3-0.7 units/ml Monitor platelets by anticoagulation protocol: Yes   Plan:  Decrease heparin infusion to 650 units/hr Check anti-Xa level in 6 hours and daily while on heparin Continue to monitor H&H and platelets  Thank you for allowing pharmacy to be a part of this patient's care.  66yo Clinical Pharmacist 2022-07-14 9:47 PM

## 2022-06-19 NOTE — ED Notes (Signed)
Pt placed on 2 L [place] for 02 Sat

## 2022-06-19 NOTE — Procedures (Signed)
Central Venous Catheter Placement:TRIPLE LUMEN   Procedure: Insertion of Non-tunneled Central Venous Catheter(36556) with US guidance (79024)   Indication(s) Medication administration and Difficult access  CVP monitoring Patient receiving vesicant or irritant drug.; Patient receiving intravenous therapy for longer than 5 days.; Patient has limited or no vascular access.    Consent Risks of the procedure as well as the alternatives and risks of each were explained to the patient and/or caregiver.  Consent for the procedure was obtained and is signed in the bedside chart  Consent:emergent     Timeout Verified patient identification, verified procedure, site/side was marked, verified correct patient position, special equipment/implants available, medications/allergies/relevant history reviewed, required imaging and test results available. Patient comfort was obtained.     Sterile Technique Maximal sterile technique including full sterile barrier drape, hand hygiene, sterile gown, sterile gloves, mask, hair covering, sterile ultrasound probe cover (if used).   Hand washing performed prior to starting the procedure.    Procedure Description Area of catheter insertion was cleaned with chlorhexidine and draped in sterile fashion.  With real-time ultrasound guidance a central venous catheter was placed into the right internal jugular vein. Nonpulsatile blood flow and easy flushing noted in all ports.  The catheter was sutured in place and sterile dressing applied.   A triple lumen catheter was placed in RT Internal Jugular Vein There was good blood return, catheter caps were placed on lumens, catheter flushed easily, the line was secured and a sterile dressing and BIO-PATCH applied.    Complications/Tolerance None; patient tolerated the procedure well. Chest X-ray is ordered to verify placement     EBL Minimal   Specimen(s) None   Number of Attempts:  1 Complications:none Estimated Blood Loss: none Chest Radiograph indicated and ordered.  Operator: Jeweldean Drohan.   Lucie Leather, M.D.  [facility]  Medical Director [facility] Medical Director [facility] Cardio-Pulmonary Department

## 2022-06-19 NOTE — Procedures (Signed)
Arterial Line Placement: Indication: Frequent blood draws; Invasive BP monitoring.   Consent: Emergent.   Hand washing performed prior to starting the procedure.   Procedure: An active timeout was performed and correct patient, name, & ID confirmed. Physicial exam was performed to ensure adequate perfusion.  Using sterile technique, an aterial line was inserted into the RT Femoral artery.  Catheter threaded and the needle was removed with appropriate blood return.  Arterial waveform was noted.  After the procedure, the patient's extremities were observed to be pink and warm.   Estimated Blood Loss: None .   Number of Attempts: 1.   Complications: None .  Operator: Tc Kapusta.   Ricardo Brooks, M.D.  [facility]  Medical Director [facility] [place] Medical Director [facility] Cardio-Pulmonary Department     

## 2022-06-19 NOTE — ED Provider Notes (Signed)
[facility] Provider Note    Event Date/Time   First MD Initiated Contact with Patient 2022/06/29 1303     (approximate)   History   Chief Complaint Chest Pain and Near Syncope   HPI  Ricardo Brooks is a 66 y.o. male with past medical history of hypertension, CAD, CHF, stroke, and bipolar disorder who presents to the ED for shortness of breath.  History is limited as patient is in significant respiratory distress after arrival to the ED.  Per EMS, he was at a worksite when he developed pain in his chest with lightheadedness and near syncope.  He was noted to be hypotensive and given IV fluids by EMS, subsequently brought to the ED.  He currently denies any chest pain but repeatedly states "I cannot breathe."     Physical Exam   Triage Vital Signs: ED Triage Vitals  Enc Vitals Group     BP 06/29/22 1203 95/69     Pulse Rate 06/29/2022 1203 60     Resp Jun 29, 2022 1203 (!) 32     Temp June 29, 2022 1203 (!) 97.2 F (36.2 C)     Temp src --      SpO2 06-29-2022 1203 93 %     Weight 29-Jun-2022 1245 146 lb 2.6 oz (66.3 kg)     Height --      Head Circumference --      Peak Flow --      Pain Score --      Pain Loc --      Pain Edu? --      Excl. in GC? --     Most recent vital signs: Vitals:   06/29/2022 1450 2022-06-29 1459  BP: (!) 112/95   Pulse: 81   Resp: (!) 24   Temp:    SpO2: 98% 93%    Constitutional: Alert and oriented. Eyes: Conjunctivae are normal. Head: Atraumatic. Nose: No congestion/rhinnorhea. Mouth/Throat: Mucous membranes are moist.  Cardiovascular: Tachycardic, regular rhythm. Grossly normal heart sounds.  2+ radial pulses bilaterally. Respiratory: Severe respiratory distress, tachypneic with accessory muscle use and crackles throughout. Gastrointestinal: Soft and nontender. No distention. Musculoskeletal: No lower extremity tenderness nor edema.  Neurologic:  Normal speech and language. No gross focal neurologic deficits are  appreciated.    ED Results / Procedures / Treatments   Labs (all labs ordered are listed, but only abnormal results are displayed) Labs Reviewed  BASIC METABOLIC PANEL - Abnormal; Notable for the following components:      Result Value   Potassium 3.3 (*)    Glucose, Bld 162 (*)    Calcium 8.6 (*)    All other components within normal limits  CBC - Abnormal; Notable for the following components:   RBC 3.94 (*)    Hemoglobin 11.9 (*)    All other components within normal limits  BLOOD GAS, VENOUS - Abnormal; Notable for the following components:   pH, Ven 7.22 (*)    pCO2, Ven 62 (*)    pO2, Ven <31 (*)    Acid-base deficit 3.5 (*)    All other components within normal limits  HEPATIC FUNCTION PANEL - Abnormal; Notable for the following components:   AST 42 (*)    All other components within normal limits  BRAIN NATRIURETIC PEPTIDE - Abnormal; Notable for the following components:   B Natriuretic Peptide 361.1 (*)    All other components within normal limits  BLOOD GAS, ARTERIAL - Abnormal; Notable for the following components:  pH, Arterial 7.05 (*)    pCO2 arterial 69 (*)    Bicarbonate 19.1 (*)    Acid-base deficit 12.3 (*)    All other components within normal limits  TROPONIN I (HIGH SENSITIVITY) - Abnormal; Notable for the following components:   Troponin I (High Sensitivity) 516 (*)    All other components within normal limits  PROTIME-INR  LACTIC ACID, PLASMA  LACTIC ACID, PLASMA  DIGOXIN LEVEL  HIV ANTIBODY (ROUTINE TESTING W REFLEX)  PROCALCITONIN  URINALYSIS, COMPLETE (UACMP) WITH MICROSCOPIC  URINE DRUG SCREEN, QUALITATIVE ([facility] ONLY)  HEPARIN LEVEL (UNFRACTIONATED)  BLOOD GAS, ARTERIAL  TROPONIN I (HIGH SENSITIVITY)     EKG  ED ECG REPORT I, Chesley Noon, the attending physician, personally viewed and interpreted this ECG.   Date: June 25, 2022  EKG Time: 12:37  Rate: 51  Rhythm: sinus bradycardia  Axis: Normal  Intervals:nonspecific  intraventricular conduction delay  ST&T Change: Lateral ST depressions, ST elevation aVR  RADIOLOGY Chest x-ray reviewed and interpreted by me with diffuse infiltrates consistent with pulmonary edema.  PROCEDURES:  Critical Care performed: Yes, see critical care procedure note(s)  .Critical Care  Performed by: Chesley Noon, MD Authorized by: Chesley Noon, MD   Critical care provider statement:    Critical care time (minutes):  45   Critical care time was exclusive of:  Separately billable procedures and treating other patients and teaching time   Critical care was necessary to treat or prevent imminent or life-threatening deterioration of the following conditions:  Respiratory failure, cardiac failure and shock   Critical care was time spent personally by me on the following activities:  Development of treatment plan with patient or surrogate, discussions with consultants, evaluation of patient's response to treatment, examination of patient, ordering and review of laboratory studies, ordering and review of radiographic studies, ordering and performing treatments and interventions, pulse oximetry, re-evaluation of patient's condition and review of old charts   I assumed direction of critical care for this patient from another provider in my specialty: no     Care discussed with: admitting provider      MEDICATIONS ORDERED IN ED: Medications  etomidate (AMIDATE) 2 MG/ML injection (0 mg  Hold June 25, 2022 1250)  norepinephrine (LEVOPHED) 4-5 MG/250ML-% infusion SOLN (  Not Given June 25, 2022 1558)  fentaNYL (SUBLIMAZE) injection 50 mcg (has no administration in time range)  fentaNYL (SUBLIMAZE) injection 50-200 mcg (has no administration in time range)  docusate (COLACE) 50 MG/5ML liquid 100 mg (has no administration in time range)  polyethylene glycol (MIRALAX / GLYCOLAX) packet 17 g (has no administration in time range)  pantoprazole (PROTONIX) injection 40 mg (has no administration in  time range)  ondansetron (ZOFRAN) injection 4 mg (has no administration in time range)  docusate (COLACE) 50 MG/5ML liquid 100 mg (has no administration in time range)  polyethylene glycol (MIRALAX / GLYCOLAX) packet 17 g (has no administration in time range)  fentaNYL in NS (16mcg/ml) infusion-PREMIX (50 mcg/hr Intravenous New Bag/Given June 25, 2022 1606)  fentaNYL (SUBLIMAZE) bolus via infusion 50-100 mcg (has no administration in time range)  midazolam (VERSED) injection 2 mg (2 mg Intravenous Given June 25, 2022 1543)  midazolam (VERSED) injection 2 mg (has no administration in time range)  insulin aspart (novoLOG) injection 0-15 Units (has no administration in time range)  heparin ADULT infusion 100 units/mL (25000 units/281mL) (0 Units/hr Intravenous Paused 25-Jun-2022 1607)  0.9 %  sodium chloride infusion (has no administration in time range)  norepinephrine (LEVOPHED) 4mg  in (0.016 mg/mL) premix  infusion (4 mcg/min Intravenous Infusion Verify 06/21/2022 1552)  midazolam (VERSED) injection 4 mg (has no administration in time range)  LORazepam (ATIVAN) injection 2 mg (has no administration in time range)  vecuronium (NORCURON) injection 10 mg (has no administration in time range)  potassium chloride 10 mEq in 100 mL IVPB (has no administration in time range)  sodium chloride 0.9 % bolus 1,000 mL (0 mLs Intravenous Stopped 06/17/2022 1247)  naloxone (NARCAN) injection 1 mg (1 mg Intravenous Given 06/28/2022 1237)  rocuronium bromide 100 MG/10ML SOSY (70 mg  Given 06/16/2022 1252)  etomidate (AMIDATE) 2 MG/ML injection (20 mg  Given 06/17/2022 1250)  furosemide (LASIX) injection 80 mg (80 mg Intravenous Given 06/18/2022 1413)  heparin bolus via infusion 4,000 Units (4,000 Units Intravenous Bolus from Bag 06/29/2022 1431)     IMPRESSION / MDM / ASSESSMENT AND PLAN / ED COURSE  I reviewed the triage vital signs and the nursing notes.                              66 y.o. male with past medical  history of hypertension, CAD, CHF, bipolar disorder, and stroke who presents to the ED with chest discomfort and near syncope at a worksite, now in significant respiratory distress here in the ED.  Patient's presentation is most consistent with acute presentation with potential threat to life or bodily function.  Differential diagnosis includes, but is not limited to, CHF exacerbation, flash pulmonary edema, ACS, PE, pneumonia, pneumothorax, dissection, COPD.  Patient ill-appearing on arrival and in significant respiratory distress with poor air movement and diffuse crackles.  EKG appears most consistent with global subendocardial ischemia with elevation in aVR and lateral ST depressions.  EKG reviewed with Dr. Fletcher Anon of cardiology, who agrees with concern for global ischemia but no indication for emergent cardiac catheterization at this time.  Patient's respiratory distress continued to worsen and he began coughing up white frothy sputum concerning for pulmonary edema.  He was placed on BiPAP but decision was made to proceed with intubation.  He was intubated and initial ET tube unfortunately had rupture of cough, was subsequently replaced over bougie.  Oxygen saturations improved and follow-up chest x-ray shows appropriate positioning of tube with significantly worsened pulmonary edema from initial x-ray.  Patient initially with low blood pressure and had received IV fluids with EMS, those were stopped after arrival to the ED.  He did not require pressors, blood pressure now improved and we will diurese with IV Lasix.  Troponin noted to be elevated at greater than 500 and cardiology agrees with plan to initiate IV heparin.  Patient was previously on Coumadin but INR normal today.  Remainder of labs are unremarkable with no significant anemia or leukocytosis, BMP without significant electrolyte abnormality or AKI.  Case discussed with ICU team for admission.      FINAL CLINICAL IMPRESSION(S) / ED  DIAGNOSES   Final diagnoses:  Acute on chronic systolic congestive heart failure (HCC)  Acute respiratory failure with hypoxia and hypercapnia (HCC)  Acute pulmonary edema ([place])     Rx / DC Orders   ED Discharge Orders     None        Note:  This document was prepared using Dragon voice recognition software and may include unintentional dictation errors.   Blake Divine, MD 06/04/2022 [phone]

## 2022-06-19 NOTE — ED Notes (Signed)
MD made aware that patient o2 needs have increased from RA to NRB 15L

## 2022-06-19 NOTE — ED Notes (Signed)
Intubation in progress.

## 2022-06-19 NOTE — H&P (Signed)
NAME:  Ricardo Brooks, MRN:  [id number], DOB:  [birth year], LOS: 0 ADMISSION DATE:  06/23/2022, CONSULTATION DATE:  06/15/2022 REFERRING MD:  Dr. Charna Archer, CHIEF COMPLAINT:  Chest pain, near syncope  Brief Pt Description / Synopsis:  66 y.o. male admitted with Acute Hypoxic & Hypercapnic Respiratory Failure in the setting of acute on chronic HFrEF requiring intubation and mechanical ventilation, along with elevated troponins in the setting of demand ischemia versus NSTEMI.  History of Present Illness:  Ricardo Brooks is a 66 year old male with a past medical history significant for HFrEF, ischemic cardiomyopathy, hypertension, hyperlipidemia, PTSD, stroke, bipolar disorder, chronic back pain who presented to [facility] ED on 06/15/2022 due to complaints of chest pain, near syncope, and hypotension.  Patient is currently intubated and sedated, unable to contribute to history, and no family is currently available,  therefore history is obtained from chart review.  Per ED and nursing notes, on his worksite today he developed chest pain, near syncope and was found to be hypotensive.  After arrival to the ED he was noted to have escalating FiO2 requirements and acute respiratory distress, failing trial of BiPAP and requiring intubation.  Initially there was concern for cardiogenic shock as a systolic blood pressure dropped to the 50s, but blood pressure recovered and he has never had to be started on Levophed.  ED Course: Initial Vital Signs: Temperature 97.2 F, respiratory rate 32, pulse 60, blood pressure 95/69, SPO2 93% Significant Labs: Potassium 3.3, glucose 162, hemoglobin 11.9, high-sensitivity troponin 516, BNP 361, INR 1.1 VBG: pH 7.22/P CO2 62/PO2 less than 31/bicarb 25.4 Urinalysis and tox screen pending Imaging Chest X-ray>>revealed moderate pulmonary edema EKG:  sinus brady, RBBB, unifocal PVC, ST depression and t wave inversion in v5 and v6, LVH with early repolarization Medications Administered: 1 L  normal saline bolus, 80 mg IV Lasix, heparin bolus and drip, Narcan 1 mg x 1 dose  Pertinent  Medical History   Past Medical History:  Diagnosis Date   Arthritis    "right hip" (06/25/2018)   Bipolar 1 disorder (HCC)    CHF (congestive heart failure) (HCC)    Childhood asthma    Chronic lower back pain    Headache    "monthly" (06/25/2018)   High cholesterol    Hip pain, chronic    Hypertension    Pneumonia 1990s X 1   PTSD (post-traumatic stress disorder)    Stroke ([place]) 06/24/2018   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  PCCM asked to admit.  Cardiology consulted for elevated troponin/CHF.  Interim History / Subjective:  -Patient seen and examined in the ED, intubated prior by ED provider -Currently sedated in no acute distress on Precedex drip -Currently not requiring vasopressors, systolic blood pressure in the 1 teens to 120s -Heparin drip was already been initiated, cardiology has evaluated patient -ET tube with copious amounts of white frothy sputum suspicious for pulmonary edema -ABG post intubation with respiratory acidosis, ventilator rate increased to 24 breaths/min, will follow-up repeat ABG  Objective   Blood pressure (!) 125/99, pulse 89, temperature (!) 97.4 F (36.3 C), resp. rate 13, weight 66.3 kg, SpO2 94 %.        Intake/Output Summary (Last 24 hours) at 06/22/2022 1335 Last data filed at 07/01/2022  1153 Gross per 24 hour  Intake 450 ml  Output --  Net 450 ml   Filed Weights   06/07/2022 1245  Weight: 66.3 kg    Examination: General: Critically ill-appearing male, laying in bed, intubated and sedated, no acute distress HENT: Atraumatic, normocephalic, neck supple, no JVD, orally intubated  (ET tube with copious amounts of white frothy sputum) Lungs: Coarse rhonchi throughout, even, synchronous with the vent, nonlabored Cardiovascular: Regular rate and rhythm, S1-S2, no murmurs, rubs, gallop Abdomen: Soft, nontender, nondistended, no guarding rebound tenderness, bowel sounds positive x4 Extremities: Normal bulk and tone, no edema no cyanosis Neuro: Sedated, pupils PERRLA GU: Foley catheter in place draining yellow urine  Resolved Hospital Problem list     Assessment & Plan:   Acute Hypoxic & Hypercapnic Respiratory Failure in the setting of Pulmonary Edema  -Full vent support, implement lung protective strategies -Plateau pressures less than 30 cm H20 -Wean FiO2 & PEEP as tolerated to maintain O2 sats >92% -Follow intermittent Chest X-ray & ABG as needed -Spontaneous Breathing Trials when respiratory parameters met and mental status permits -Implement VAP Bundle -Prn Bronchodilators -Diuresis as blood pressure and renal function permits (received 80 mg IV Lasix x1 dose in ED)  Acute on Chronic HFrEF (LVEF 30 to 35%) Elevated troponin in setting of demand ischemia vs. NSTEMI Hypotension: ? Cardiogenic shock ~ RESOLVED PMHx: Ischemic cardiomyopathy, LV thrombus, hypertension, hyperlipidemia -Continuous cardiac monitoring -Maintain MAP >65 -Cautious IV fluids (received 1 L normal saline bolus in ED) -Vasopressors as needed to maintain MAP goal -Trend lactic acid until normalized -Trend HS Troponin until peaked (515 ~ ) -Check urine drug screen -BNP 361 at admission -Repeat echocardiogram  -Cardiology following, appreciate input -Heparin drip for anticoagulation -Diuresis as blood pressure and renal function permits  Hypokalemia -Monitor I&O's / urinary output -Follow BMP -Ensure adequate renal perfusion -Avoid nephrotoxic agents as able -Replace electrolytes as indicated  Sedation needs in the setting of mechanical ventilation PMHx: Bipolar  disorder -Maintain a RASS goal of 0 To -1 -Fentanyl and Precedex as needed to maintain RASS goal -Avoid sedating medications as able -Daily wake up assessment  Hyperglycemia -CBG's q4h; Target range of 140 to 180 -SSI -Follow ICU Hypo/Hyperglycemia protocol -Check hemoglobin A1c   Pt is critically ill, prognosis is guarded.  Best Practice (right click and "Reselect all SmartList Selections" daily)   Diet/type: NPO DVT prophylaxis: Systemic heparin GI prophylaxis: PPI Lines: N/A Foley: Yes, and is still needed Code Status:  full code Last date of multidisciplinary goals of care discussion [N/A]  Will update family when they arrive at bedside.  Labs   CBC: Recent Labs  Lab 06/18/2022 1202  WBC 6.4  HGB 11.9*  HCT 39.2  MCV 99.5  PLT 226    Basic Metabolic Panel: Recent Labs  Lab 06/29/2022 1202  NA 140  K 3.3*  CL 106  CO2 22  GLUCOSE 162*  BUN 11  CREATININE 1.13  CALCIUM 8.6*   GFR: CrCl cannot be calculated (Unknown ideal weight.). Recent Labs  Lab 06/30/2022 1202  WBC 6.4    Liver Function Tests: Recent Labs  Lab 06/18/2022 1202  AST 42*  ALT 25  ALKPHOS 53  BILITOT 0.7  PROT 6.7  ALBUMIN 3.5   No results for input(s): "LIPASE", "AMYLASE" in the last 168 hours. No results for input(s): "AMMONIA" in the last 168 hours.  ABG    Component Value Date/Time   PHART 7.425 02/14/2018 0903   PCO2ART 31.6 (L) 02/14/2018  4098   PO2ART 72.0 (L) 02/14/2018 0903   HCO3 25.4 06/08/2022 1240   TCO2 22 02/14/2018 0903   ACIDBASEDEF 3.5 (H) 06/27/2022 1240   O2SAT 13.5 06/13/2022 1240     Coagulation Profile: Recent Labs  Lab 06/26/2022 1202  INR 1.2    Cardiac Enzymes: No results for input(s): "CKTOTAL", "CKMB", "CKMBINDEX", "TROPONINI" in the last 168 hours.  HbA1C: Hgb A1c MFr Bld  Date/Time Value Ref Range Status  06/25/2018 04:18 AM 5.6 4.8 - 5.6 % Final    Comment:    (NOTE) Pre diabetes:          5.7%-6.4% Diabetes:               >6.4% Glycemic control for   <7.0% adults with diabetes   02/13/2018 12:12 AM 5.7 (H) 4.8 - 5.6 % Final    Comment:    (NOTE) Pre diabetes:          5.7%-6.4% Diabetes:              >6.4% Glycemic control for   <7.0% adults with diabetes     CBG: No results for input(s): "GLUCAP" in the last 168 hours.  Review of Systems:   Unable to assess due to intubation/sedation/critical illness  Past Medical History:  He,  has a past medical history of Arthritis, Bipolar 1 disorder ([place]), CHF (congestive heart failure) ([place]), Childhood asthma, Chronic lower back pain, Headache, High cholesterol, Hip pain, chronic, Hypertension, Pneumonia (1990s X 1), PTSD (post-traumatic stress disorder), and Stroke ([place]) (06/24/2018).   Surgical History:   Past Surgical History:  Procedure Laterality Date   FRACTURE SURGERY     ORIF ANKLE FRACTURE Right 1985   RIGHT/LEFT HEART CATH AND CORONARY ANGIOGRAPHY N/A 02/14/2018   Procedure: RIGHT/LEFT HEART CATH AND CORONARY ANGIOGRAPHY;  Surgeon: Nelva Bush, MD;  Location: [facility];  Service: Cardiovascular;  Laterality: N/A;   TONSILLECTOMY       Social History:   reports that he has been smoking cigarettes. He has a 20.50 pack-year smoking history. He has never used smokeless tobacco. He reports current alcohol use of about 1.0 standard drink of alcohol per week. He reports that he does not currently use drugs.   Family History:  His family history is not on file.   Allergies Allergies  Allergen Reactions   Methocarbamol Nausea And Vomiting and Anxiety     Home Medications  Prior to Admission medications   Medication Sig Start Date End Date Taking? Authorizing Provider  aspirin EC 81 MG EC tablet Take 1 tablet (81 mg total) by mouth daily. 02/18/18   Duke, Tami Lin, PA  atorvastatin (LIPITOR) 80 MG tablet Take 1 tablet (80 mg total) by mouth daily at 6 PM. 08/26/18   Duke, Tami Lin, PA  cyclobenzaprine (FLEXERIL) 10 MG  tablet Take 1 tablet (10 mg total) by mouth 2 (two) times daily as needed for muscle spasms. 05/29/20   Recardo Evangelist, PA-C  digoxin (LANOXIN) 0.125 MG tablet Take 1 tablet (0.125 mg total) by mouth daily. 05/09/22   Lorretta Harp, MD  furosemide (LASIX) 40 MG tablet Take 0.5 tablets (20 mg total) by mouth daily. 05/09/22   Lorretta Harp, MD  ibuprofen (ADVIL) 800 MG tablet Take 1 tablet (800 mg total) by mouth every 8 (eight) hours as needed for mild pain. 05/21/19   Ward, Delice Bison, DO  losartan (COZAAR) 25 MG tablet Take 0.5 tablets (12.5 mg total) by mouth daily.  08/14/19   Duke, Tami Lin, PA  metoprolol succinate (TOPROL XL) 25 MG 24 hr tablet Take 0.5 tablets (12.5 mg total) by mouth daily. 08/14/19   Duke, Tami Lin, PA  nitroGLYCERIN (NITROSTAT) 0.4 MG SL tablet Place 1 tablet (0.4 mg total) under the tongue every 5 (five) minutes x 3 doses as needed for chest pain. 02/17/18   Duke, Tami Lin, PA  oxyCODONE-acetaminophen (PERCOCET) 10-325 MG tablet Take 1 tablet by mouth 3 (three) times daily as needed. 02/15/21   [provider]  spironolactone (ALDACTONE) 25 MG tablet Take 0.5 tablets (12.5 mg total) by mouth at bedtime. 08/14/19   Duke, Tami Lin, PA  traMADol (ULTRAM) 50 MG tablet Take 1 tablet (50 mg total) by mouth every 6 (six) hours as needed. 05/29/20   Recardo Evangelist, PA-C  warfarin (COUMADIN) 5 MG tablet Take 1-2 TABLETS DAILY OR AS PRESCRIBED BY COUMADIN CLINIC.  NEEDS COUMADIN INR CHECK FOR REFILLS 04/07/22   Lorretta Harp, MD     Critical care time: 55 minutes     Darel Hong, AGACNP-BC [facility] Prefer epic messenger for cross cover needs If after hours, please call E-link

## 2022-06-19 NOTE — Progress Notes (Signed)
ANTICOAGULATION CONSULT NOTE - Initial Consult  Pharmacy Consult for Heparin Drip Indication:  ACS/STEMI  Allergies  Allergen Reactions   Methocarbamol Nausea And Vomiting and Anxiety    Patient Measurements: Weight: 66.3 kg (146 lb 2.6 oz) Heparin Dosing Weight: 66.3 kg  Vital Signs: Temp: 98.5 F (36.9 C) (07/17 1415) BP: 119/90 (07/17 1415) Pulse Rate: 87 (07/17 1415)  Labs: Recent Labs    06/18/2022 1202  HGB 11.9*  HCT 39.2  PLT 187  LABPROT 14.9  INR 1.2  CREATININE 1.13  TROPONINIHS 516*    CrCl cannot be calculated (Unknown ideal weight.).   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 Pharmacy consulted for Heparin dosing.  Goal of Therapy:  Heparin level 0.3-0.7 units/ml Monitor platelets by anticoagulation protocol: Yes   Plan:  Give 4000 units bolus x 1 Start heparin infusion at 800 units/hr Check anti-Xa level in 6 hours and daily while on heparin Continue to monitor H&H and platelets  Clovia Cuff, PharmD, BCPS 06/08/2022 2:32 PM

## 2022-06-19 NOTE — Consult Note (Signed)
Cardiology Consultation:   Patient ID: Ricardo CabalDavid Brooks MRN: [id number]; DOB: [birth year]  Admit date: 06/22/2022 Date of Consult: 06/25/2022  PCP:  Wilmer FloorHorton, Lovika, NP   [facility] Providers Cardiologist:  Nanetta BattyJonathan Berry, MD        Patient Profile:   Ricardo CabalDavid Brooks is a 66 y.o. male with a hx of systolic congestive heart failure, ischemic cardiomyopathy, hyperlipidemia, hypertension, PTSD, stroke, bipolar disorder, chronic hip pain, and chronic low back pain, who is being seen 06/11/2022 for the evaluation of near syncope and chest pain at the request of Dr. Larinda ButteryJessup.  History of Present Illness:   Ricardo Brooks 66 year old male with a complex past medical history of systolic congestive heart failure with known EF of 30 to 35% with last echocardiogram 05/2021, ischemic cardiomyopathy with his last right and left heart catheterization done  02/14/2018, hyperlipidemia, hypertension, PTSD, stroke, bipolar disorder, chronic hip pain, chronic low back pain and LV thrombus, who had previously been on warfarin therapy.  Last heart catheterization was a right and left heart catheterization done by Dr. Okey DupreEnd on 02/14/2018 revealed LAD subtotally occluded ostial LAD with TIMI I flow bidirectional collaterals.  Subsequent viability study showed a scar on anterior wall with anterior mural thrombus.  That point time he was placed on Coumadin.  TTE 06/25/2018 revealed an EF of 25 to 30% with regional wall motion abnormalities in the LAD territory but no evidence of apical mural thrombus.  He was then scheduled for limited echocardiogram to be done in 3 months to determine if ICD placement was negated. He presented to the emergency department today via EMS from his worksite with hypotension, near syncope and chest pain.  He had increasing oxygen demand needs and was escalated from a 2 L nasal cannula to BiPAP to intubation.  Initially there were concerns for cardiogenic shock as his blood pressure dropped into the 50s  systolic.  He was ordered for Levophed drip but subsequently never had to have been started.  He was intubated without difficulty and started on a Precedex drip.  Initial vital signs: Blood pressure 95/69, pulse of 60, respiratory rate of 32, temperature 97.2, SPO2 93%  Pertinent labs: Potassium 3.3, glucose 162, calcium 8.6, hemoglobin 11.9, high-sensitivity troponin of 516, BNP 361.1, INR of 1.1  Imaging: Chest x-ray prior to intubation revealed moderate pulmonary edema.  Chest x-ray postintubation showed ET tube that was present, significant interval progression of interstitial and airspace opacities throughout both lungs to reflect edema, correlate clinically alternative suggestions of pneumonia, mild cardiomegaly.    Past Medical History:  Diagnosis Date   Arthritis    "right hip" (06/25/2018)   Bipolar 1 disorder (HCC)    CHF (congestive heart failure) (HCC)    Childhood asthma    Chronic lower back pain    Headache    "monthly" (06/25/2018)   High cholesterol    Hip pain, chronic    Hypertension    Pneumonia 1990s X 1   PTSD (post-traumatic stress disorder)    Stroke (HCC) 06/24/2018   left parietal CVA/notes 06/25/2018 "speech comes and goes" (06/25/2018)    Past Surgical History:  Procedure Laterality Date   FRACTURE SURGERY     ORIF ANKLE FRACTURE Right 1985   RIGHT/LEFT HEART CATH AND CORONARY ANGIOGRAPHY N/A 02/14/2018   Procedure: RIGHT/LEFT HEART CATH AND CORONARY ANGIOGRAPHY;  Surgeon: Yvonne KendallEnd, Christopher, MD;  Location: [facility];  Service: Cardiovascular;  Laterality: N/A;   TONSILLECTOMY       Home Medications:  Prior  to Admission medications   Medication Sig Start Date End Date Taking? Authorizing Provider  aspirin EC 81 MG EC tablet Take 1 tablet (81 mg total) by mouth daily. 02/18/18   Duke, Roe Rutherford, PA  atorvastatin (LIPITOR) 80 MG tablet Take 1 tablet (80 mg total) by mouth daily at 6 PM. 08/26/18   Duke, Roe Rutherford, PA  cyclobenzaprine  (FLEXERIL) 10 MG tablet Take 1 tablet (10 mg total) by mouth 2 (two) times daily as needed for muscle spasms. 05/29/20   Bethel Born, PA-C  digoxin (LANOXIN) 0.125 MG tablet Take 1 tablet (0.125 mg total) by mouth daily. 05/09/22   Runell Gess, MD  furosemide (LASIX) 40 MG tablet Take 0.5 tablets (20 mg total) by mouth daily. 05/09/22   Runell Gess, MD  ibuprofen (ADVIL) 800 MG tablet Take 1 tablet (800 mg total) by mouth every 8 (eight) hours as needed for mild pain. 05/21/19   Ward, Layla Maw, DO  losartan (COZAAR) 25 MG tablet Take 0.5 tablets (12.5 mg total) by mouth daily. 08/14/19   Duke, Roe Rutherford, PA  metoprolol succinate (TOPROL XL) 25 MG 24 hr tablet Take 0.5 tablets (12.5 mg total) by mouth daily. 08/14/19   Duke, Roe Rutherford, PA  nitroGLYCERIN (NITROSTAT) 0.4 MG SL tablet Place 1 tablet (0.4 mg total) under the tongue every 5 (five) minutes x 3 doses as needed for chest pain. 02/17/18   Duke, Roe Rutherford, PA  oxyCODONE-acetaminophen (PERCOCET) 10-325 MG tablet Take 1 tablet by mouth 3 (three) times daily as needed. 02/15/21   [provider]  spironolactone (ALDACTONE) 25 MG tablet Take 0.5 tablets (12.5 mg total) by mouth at bedtime. 08/14/19   Duke, Roe Rutherford, PA  traMADol (ULTRAM) 50 MG tablet Take 1 tablet (50 mg total) by mouth every 6 (six) hours as needed. 05/29/20   Bethel Born, PA-C  warfarin (COUMADIN) 5 MG tablet Take 1-2 TABLETS DAILY OR AS PRESCRIBED BY COUMADIN CLINIC.  NEEDS COUMADIN INR CHECK FOR REFILLS 04/07/22   Runell Gess, MD    Inpatient Medications: Scheduled Meds:  docusate  100 mg Per Tube BID   etomidate       heparin  4,000 Units Intravenous Once   insulin aspart  0-15 Units Subcutaneous Q4H   pantoprazole (PROTONIX) IV  40 mg Intravenous QHS   polyethylene glycol  17 g Per Tube Daily   Continuous Infusions:  dexmedetomidine (PRECEDEX) IV infusion 0.4 mcg/kg/hr (06/03/2022 1309)   fentaNYL infusion INTRAVENOUS      heparin     norepinephrine     norepinephrine (LEVOPHED) Adult infusion     PRN Meds: docusate, etomidate, fentaNYL, fentaNYL (SUBLIMAZE) injection, fentaNYL (SUBLIMAZE) injection, midazolam, midazolam, norepinephrine, ondansetron (ZOFRAN) IV, polyethylene glycol  Allergies:    Allergies  Allergen Reactions   Methocarbamol Nausea And Vomiting and Anxiety    Social History:   Social History   Socioeconomic History   Marital status: Divorced    Spouse name: Not on file   Number of children: Not on file   Years of education: Not on file   Highest education level: Not on file  Occupational History   Not on file  Tobacco Use   Smoking status: Every Day    Packs/day: 0.50    Years: 41.00    Total pack years: 20.50    Types: Cigarettes   Smokeless tobacco: Never  Vaping Use   Vaping Use: Never used  Substance and Sexual Activity   Alcohol  use: Yes    Alcohol/week: 1.0 standard drink of alcohol    Types: 1 Cans of beer per week   Drug use: Not Currently   Sexual activity: Yes  Other Topics Concern   Not on file  Social History Narrative   Not on file   Social Determinants of Health   Financial Resource Strain: Not on file  Food Insecurity: Not on file  Transportation Needs: Unmet Transportation Needs (03/10/2021)   PRAPARE - Administrator, Civil Service (Medical): Yes    Lack of Transportation (Non-Medical): Yes  Physical Activity: Not on file  Stress: Not on file  Social Connections: Not on file  Intimate Partner Violence: Not on file    Family History:   No family history on file.   ROS:  Please see the history of present illness.  Review of Systems  Reason unable to perform ROS: patient intubated and sedated.    All other ROS reviewed and negative.     Physical Exam/Data:   Vitals:   21-Jun-2022 1330 2022/06/21 1345 June 21, 2022 1400 06/21/2022 1415  BP: (!) 125/99 119/88 (!) 119/95 119/90  Pulse: 89 67 84 87  Resp: 13 14 10 14   Temp: (!) 97.4 F  (36.3 C) 97.9 F (36.6 C) 98.2 F (36.8 C) 98.5 F (36.9 C)  SpO2: 94% 90% 94% 93%  Weight:        Intake/Output Summary (Last 24 hours) at 06/21/2022 1428 Last data filed at 21-Jun-2022 1153 Gross per 24 hour  Intake 450 ml  Output --  Net 450 ml      21-Jun-2022   12:45 PM 03/09/2021    2:58 PM 07/25/2020    1:44 PM  Last 3 Weights  Weight (lbs) 146 lb 2.6 oz 140 lb 137 lb  Weight (kg) 66.3 kg 63.504 kg 62.143 kg     Body mass index is 21.28 kg/m.  General:  Intubated and sedated HEENT: Atraumatic, normocephalic, anicteric sclera, ETT intact and secured with commercial tube holder Neck: JVD unable to assess due to ETT Vascular: No carotid bruits; Distal pulses 2+ bilaterally Cardiac:  normal S1, S2; RRR; no murmur  Lungs: Rhonchorous to auscultation bilaterally, respirations on ventilator assisted, unlabored while sedated Abd: soft, nontender, no hepatomegaly, bowel sounds hypoactive Ext: no edema Musculoskeletal:  No deformities, BUE and BLE strength normal and equal Skin: warm and dry  Neuro: Currently intubated and sedated Psych: Intubated and sedated  EKG:  The EKG was personally reviewed and demonstrates:  sinus brady, RBBB, unifocal PVC, ST depression and t wave inversion in v5 and v6, LVH with early repolarization Telemetry:  Telemetry was personally reviewed and demonstrates:  sinus rate of 70's, lvh  Relevant CV Studies: TTE completed 05/25/21 1. Left ventricular ejection fraction, by estimation, is 30 to 35%. The  left ventricle has moderately decreased function. The left ventricle  demonstrates regional wall motion abnormalities (see scoring  diagram/findings for description). The left  ventricular internal cavity size was moderately dilated. Left ventricular  diastolic parameters are indeterminate.   2. Right ventricular systolic function is normal. The right ventricular  size is normal. Tricuspid regurgitation signal is inadequate for assessing  PA pressure.    3. The mitral valve is normal in structure. Mild mitral valve  regurgitation. No evidence of mitral stenosis.   4. The aortic valve is tricuspid. Aortic valve regurgitation is not  visualized. No aortic stenosis is present.   5. The inferior vena cava is normal in size with  greater than 50%  respiratory variability, suggesting right atrial pressure of 3 mmHg.   Cardiac MRI completed 02/15/2018 1. Mild to moderate LV dilation with normal wall thickness. EF 20% with wall motion abnormalities as noted above. There was a layered LV apical thrombus.   2.  Normal RV size with mildly decreased systolic function.   3. Delayed enhancement images show significant scarring in the LAD territory. I suspect that the mid to apical anterior/anteroseptal walls, the apical inferior wall, the apical lateral wall, and the true apex are not viable.  R/LHC completed 02/14/2018 Severe single-vessel coronary artery disease, including 99% chronic subtotal occlusion of the ostial LAD with TIMI-1 flow and left-to-left and right-to-left collaterals. Small D1, ramus, and RV marginal branches have 80-90%% ostial stenoses. Moderately elevated left and right heart filling pressures. Moderate pulmonary hypertension. Low Fick cardiac output/index.  Laboratory Data:  High Sensitivity Troponin:   Recent Labs  Lab 07/12/22 1202  TROPONINIHS 516*     Chemistry Recent Labs  Lab 12-Jul-2022 1202  NA 140  K 3.3*  CL 106  CO2 22  GLUCOSE 162*  BUN 11  CREATININE 1.13  CALCIUM 8.6*  GFRNONAA >60  ANIONGAP 12    Recent Labs  Lab 07-12-2022 1202  PROT 6.7  ALBUMIN 3.5  AST 42*  ALT 25  ALKPHOS 53  BILITOT 0.7   Lipids No results for input(s): "CHOL", "TRIG", "HDL", "LABVLDL", "LDLCALC", "CHOLHDL" in the last 168 hours.  Hematology Recent Labs  Lab 07-12-2022 1202  WBC 6.4  RBC 3.94*  HGB 11.9*  HCT 39.2  MCV 99.5  MCH 30.2  MCHC 30.4  RDW 14.9  PLT 187   Thyroid No results for input(s): "TSH",  "FREET4" in the last 168 hours.  BNP Recent Labs  Lab 2022-07-12 1202  BNP 361.1*    DDimer No results for input(s): "DDIMER" in the last 168 hours.   Radiology/Studies:  DG Chest Portable 1 View  Result Date: 2022-07-12 CLINICAL DATA:  Provided history: Intubation. Encounter for intubation and OG tube placement. EXAM: PORTABLE CHEST 1 VIEW COMPARISON:  Prior chest radiograph 07/12/22 and earlier. FINDINGS: Interval intubation. The ET tube terminates just below level of the clavicular heads. An enteric tube is present with tip projecting at the level of the gastric cardia, and side port at the level of the distal esophagus. Mild cardiomegaly. Since the chest radiograph performed earlier today, there has been significant progression of interstitial and airspace opacities throughout both lungs. No evidence of pleural effusion or pneumothorax. No acute bony abnormality identified. Degenerative changes of the spine. Impression #1 called by telephone at the time of interpretation on 07/12/22 at 1:26 pm to provider Dr. Juliette Alcide, who verbally acknowledged these results. IMPRESSION: 1. The enteric tube terminates at the level of the gastric cardia (with side port at the level of the distal esophagus). Consider tube advancement. 2. ET tube present with tip projecting just below the level of the clavicular heads. 3. Significant interval progression of interstitial and airspace opacities throughout both lungs. This may reflect edema. However, correlate clinically to exclude signs/symptoms that would alternatively suggest pneumonia. 4. Mild cardiomegaly. Electronically Signed   By: Jackey Loge D.O.   On: 12-Jul-2022 13:29   DG Chest Portable 1 View  Result Date: 12-Jul-2022 CLINICAL DATA:  Altered mental status EXAM: PORTABLE CHEST 1 VIEW COMPARISON:  Chest x-ray dated March 12 20,019 FINDINGS: Unchanged cardiomegaly. Moderate bilateral interstitial and airspace opacities. No large pleural effusion or  pneumothorax. IMPRESSION: Moderate pulmonary  edema. Electronically Signed   By: Allegra Lai M.D.   On: 06/22/2022 12:22     Assessment and Plan:   Acute on chronic HFrEF, ischemic cardiomyopathy EF 30-35% -echocardiogram ordered and pending -last study revealed LVEF 30-35% in 2022 -lasix 80 mg IVP once given in ED, patient will likely need daily lasix as renal function and blood pressure allows -bnp 361.1 -currently losartan, metoprolol succinate, spironolactone, and lasix are on hold due to hypotension -escalate therapy and restart PTA medications as blood pressure allows -CXR with concern of pulmonary edema -daily weight -strict I&O   elevated Hs Troponins with demand ischemia vs  NSTEMI, h/o LV thrombus -continue to trend Hs troponins -Hs troponin 516 -start heparin drip per pharmacy protocol -continue asa -cardiac MRI in 2019 revealed delayed enhancement images show significant scarring in the LAD territory.I suspect that the mid to apical anterior/anteroseptal walls, the apical inferior wall, the apical lateral wall, and the true apex are not viable. -will revisit need for repeat cardiac catheterization once acute illness improves -INR 1.1 subtherapeutic, chart reveals several missed appointments to warfarin clinic, question if patietn is still taking this medication -check UDS -continuous cardiac monitoring   Acute hypoxic and hypercapnic respiratory failure, pulmonary edema -ventilatory support and management per CCM -diuresis as blood pressure and renal function allows -supportive care  Hypokalemia -potassium 3.3 -monitor/trend/replete as needed -daily bmp   Risk Assessment/Risk Scores:     TIMI Risk Score for Unstable Angina or Non-ST Elevation MI:   The patient's TIMI risk score is 4, which indicates a 20% risk of all cause mortality, new or recurrent myocardial infarction or need for urgent revascularization in the next 14 days.  New York Heart Association  (NYHA) Functional Class NYHA Class IV        For questions or updates, please contact [facility] Please consult [email or web address] for contact info under    Signed, Cherylyn Sundby, NP  06/17/2022 2:28 PM

## 2022-06-19 NOTE — Progress Notes (Signed)
Patient arrived to unit with RT and ED RN. Shortly after arrival patient became hypotensive requiring levophed. MD Kasa placed central line and art line emergently. Family updated by MD Kasa. Patient continued to be hypotensive requiring the addition of vasopressin to sustain adequate perfusion. Bair Hugger placed on patient to slowly increase temperature of 36.4 C since arrival to unit. RT at bedside to change filter multiple times as frothy secretions clogging filter. Patient suctions adequately to remove frothy secretions. Family on way to hospital from [facility] per MD Kasa when updating on phone call. CN Megan to bedside to assist this RN with assessing patient for increased needs. All interventions ordered have been performed by staff at this time.

## 2022-06-19 NOTE — Consult Note (Signed)
PHARMACY CONSULT NOTE - FOLLOW UP  Pharmacy Consult for Electrolyte Monitoring and Replacement   Recent Labs: Potassium (mmol/L)  Date Value  07/06/22 3.3 (L)   Magnesium (mg/dL)  Date Value  96/22/2979 1.7   Calcium (mg/dL)  Date Value  89/21/1941 8.6 (L)   Albumin (g/dL)  Date Value  74/07/1447 3.5  07/22/2019 4.1   Sodium (mmol/L)  Date Value  07/06/22 140  08/26/2018 141     Assessment: 65YO M w/ h/o HFrEF, iCMP, HTN, HLD, PTSD, stroke, BP d/o, chronic back pain who presented to [facility] ED on Jul 06, 2022 d/t c/o chest pain, near syncope, and hypoTN.  Patient is currently intubated and sedated, unable to contribute to history, and no family is currently available. Pharmacy consulted for mgmt of electrolytes while in CCU.  Goal of Therapy:  Lytes WNL  Plan:  Scr 1.13 (BL unk) K 3.3. Will replete with KCL IV q1h x2 doses Mg/Phos - NNL, Will order for AM Next labs with AM labs 0500  Martyn Malay ,PharmD Clinical Pharmacist 2022-07-06 4:01 PM

## 2022-06-20 ENCOUNTER — Inpatient Hospital Stay: Payer: Medicaid Other

## 2022-06-20 ENCOUNTER — Inpatient Hospital Stay (HOSPITAL_COMMUNITY)
Admit: 2022-06-20 | Discharge: 2022-06-20 | Disposition: A | Discharge status: Home / Self Care | Payer: Medicaid Other | Attending: Cardiology | Admitting: Cardiology

## 2022-06-20 DIAGNOSIS — I214 Non-ST elevation (NSTEMI) myocardial infarction: Secondary | ICD-10-CM | POA: Diagnosis not present

## 2022-06-20 DIAGNOSIS — R570 Cardiogenic shock: Secondary | ICD-10-CM | POA: Diagnosis not present

## 2022-06-20 DIAGNOSIS — I5023 Acute on chronic systolic (congestive) heart failure: Secondary | ICD-10-CM | POA: Diagnosis not present

## 2022-06-20 DIAGNOSIS — J9601 Acute respiratory failure with hypoxia: Secondary | ICD-10-CM | POA: Diagnosis not present

## 2022-06-20 DIAGNOSIS — I5021 Acute systolic (congestive) heart failure: Secondary | ICD-10-CM

## 2022-06-20 DIAGNOSIS — J9602 Acute respiratory failure with hypercapnia: Secondary | ICD-10-CM | POA: Diagnosis not present

## 2022-06-20 LAB — BLOOD GAS, ARTERIAL
Acid-base deficit: 16.4 mmol/L — ABNORMAL HIGH (ref 0.0–2.0)
Acid-base deficit: 16.1 mmol/L — ABNORMAL HIGH (ref 0.0–2.0)
Acid-base deficit: 15.2 mmol/L — ABNORMAL HIGH (ref 0.0–2.0)
Bicarbonate: 14.1 mmol/L — ABNORMAL LOW (ref 20.0–28.0)
Bicarbonate: 17.1 mmol/L — ABNORMAL LOW (ref 20.0–28.0)
Bicarbonate: 17.4 mmol/L — ABNORMAL LOW (ref 20.0–28.0)
Drawn by: 30136
Drawn by: 30136
FIO2: 100 %
FIO2: 100 %
FIO2: 100 %
MECHVT: 500 mL
MECHVT: 450 mL
MECHVT: 450 mL
O2 Saturation: 88.3 %
O2 Saturation: 88.2 %
O2 Saturation: 77 %
PEEP: 12 cmH2O
PEEP: 12 cmH2O
PEEP: 12 cmH2O
Patient temperature: 37
Patient temperature: 37
Patient temperature: 36.7
RATE: 28 resp/min
RATE: 24 resp/min
RATE: 30 resp/min
pCO2 arterial: 51 mmHg — ABNORMAL HIGH (ref 32–48)
pCO2 arterial: 78 mmHg (ref 32–48)
pCO2 arterial: 73 mmHg (ref 32–48)
pH, Arterial: 7.05 — CL (ref 7.35–7.45)
pH, Arterial: 6.95 — CL (ref 7.35–7.45)
pH, Arterial: 6.98 — CL (ref 7.35–7.45)
pO2, Arterial: 67 mmHg — ABNORMAL LOW (ref 83–108)
pO2, Arterial: 69 mmHg — ABNORMAL LOW (ref 83–108)
pO2, Arterial: 52 mmHg — ABNORMAL LOW (ref 83–108)

## 2022-06-20 LAB — LACTIC ACID, PLASMA
Lactic Acid, Venous: >9 mmol/L (ref 0.5–1.9)
Lactic Acid, Venous: >9 mmol/L (ref 0.5–1.9)
Lactic Acid, Venous: >9 mmol/L (ref 0.5–1.9)

## 2022-06-20 LAB — HEMOGLOBIN A1C
Hgb A1c MFr Bld: 5.7 % — ABNORMAL HIGH (ref 4.8–5.6)
Mean Plasma Glucose: 116.89 mg/dL

## 2022-06-20 LAB — CBC WITH DIFFERENTIAL/PLATELET
Abs Immature Granulocytes: 0.06 10*3/uL (ref 0.00–0.07)
Basophils Absolute: 0 10*3/uL (ref 0.0–0.1)
Basophils Relative: 0 %
Eosinophils Absolute: 0 10*3/uL (ref 0.0–0.5)
Eosinophils Relative: 0 %
HCT: 43.9 % (ref 39.0–52.0)
Hemoglobin: 12.7 g/dL — ABNORMAL LOW (ref 13.0–17.0)
Immature Granulocytes: 1 %
Lymphocytes Relative: 23 %
Lymphs Abs: 2.1 10*3/uL (ref 0.7–4.0)
MCH: 29.4 pg (ref 26.0–34.0)
MCHC: 28.9 g/dL — ABNORMAL LOW (ref 30.0–36.0)
MCV: 101.6 fL — ABNORMAL HIGH (ref 80.0–100.0)
Monocytes Absolute: 0.6 10*3/uL (ref 0.1–1.0)
Monocytes Relative: 7 %
Neutro Abs: 6.4 10*3/uL (ref 1.7–7.7)
Neutrophils Relative %: 69 %
Platelets: 115 10*3/uL — ABNORMAL LOW (ref 150–400)
RBC: 4.32 MIL/uL (ref 4.22–5.81)
RDW: 15.5 % (ref 11.5–15.5)
WBC: 9.1 10*3/uL (ref 4.0–10.5)
nRBC: 0.7 % — ABNORMAL HIGH (ref 0.0–0.2)

## 2022-06-20 LAB — GLUCOSE, CAPILLARY
Glucose-Capillary: 127 mg/dL — ABNORMAL HIGH (ref 70–99)
Glucose-Capillary: 189 mg/dL — ABNORMAL HIGH (ref 70–99)
Glucose-Capillary: 207 mg/dL — ABNORMAL HIGH (ref 70–99)
Glucose-Capillary: 34 mg/dL — CL (ref 70–99)
Glucose-Capillary: 35 mg/dL — CL (ref 70–99)
Glucose-Capillary: 55 mg/dL — ABNORMAL LOW (ref 70–99)
Glucose-Capillary: 63 mg/dL — ABNORMAL LOW (ref 70–99)
Glucose-Capillary: 77 mg/dL (ref 70–99)
Glucose-Capillary: 89 mg/dL (ref 70–99)
Glucose-Capillary: 91 mg/dL (ref 70–99)

## 2022-06-20 LAB — CBC
HCT: 49 % (ref 39.0–52.0)
Hemoglobin: 14.5 g/dL (ref 13.0–17.0)
MCH: 29.5 pg (ref 26.0–34.0)
MCHC: 29.6 g/dL — ABNORMAL LOW (ref 30.0–36.0)
MCV: 99.8 fL (ref 80.0–100.0)
Platelets: 148 10*3/uL — ABNORMAL LOW (ref 150–400)
RBC: 4.91 MIL/uL (ref 4.22–5.81)
RDW: 15.2 % (ref 11.5–15.5)
WBC: 11.3 10*3/uL — ABNORMAL HIGH (ref 4.0–10.5)
nRBC: 0.4 % — ABNORMAL HIGH (ref 0.0–0.2)

## 2022-06-20 LAB — BASIC METABOLIC PANEL
Anion gap: 17 — ABNORMAL HIGH (ref 5–15)
Anion gap: 25 — ABNORMAL HIGH (ref 5–15)
BUN: 25 mg/dL — ABNORMAL HIGH (ref 8–23)
BUN: 28 mg/dL — ABNORMAL HIGH (ref 8–23)
CO2: 16 mmol/L — ABNORMAL LOW (ref 22–32)
CO2: 17 mmol/L — ABNORMAL LOW (ref 22–32)
Calcium: 7.7 mg/dL — ABNORMAL LOW (ref 8.9–10.3)
Calcium: 7.2 mg/dL — ABNORMAL LOW (ref 8.9–10.3)
Chloride: 107 mmol/L (ref 98–111)
Chloride: 104 mmol/L (ref 98–111)
Creatinine, Ser: 2.71 mg/dL — ABNORMAL HIGH (ref 0.61–1.24)
Creatinine, Ser: 3.34 mg/dL — ABNORMAL HIGH (ref 0.61–1.24)
GFR, Estimated: 26 mL/min — ABNORMAL LOW (ref >60)
GFR, Estimated: 20 mL/min — ABNORMAL LOW (ref >60)
Glucose, Bld: 153 mg/dL — ABNORMAL HIGH (ref 70–99)
Glucose, Bld: 43 mg/dL — CL (ref 70–99)
Potassium: 5 mmol/L (ref 3.5–5.1)
Potassium: 4.2 mmol/L (ref 3.5–5.1)
Sodium: 140 mmol/L (ref 135–145)
Sodium: 146 mmol/L — ABNORMAL HIGH (ref 135–145)

## 2022-06-20 LAB — RENAL FUNCTION PANEL
Albumin: 1.8 g/dL — ABNORMAL LOW (ref 3.5–5.0)
Anion gap: 29 — ABNORMAL HIGH (ref 5–15)
BUN: 31 mg/dL — ABNORMAL HIGH (ref 8–23)
CO2: 17 mmol/L — ABNORMAL LOW (ref 22–32)
Calcium: 6.8 mg/dL — ABNORMAL LOW (ref 8.9–10.3)
Chloride: 100 mmol/L (ref 98–111)
Creatinine, Ser: 3.62 mg/dL — ABNORMAL HIGH (ref 0.61–1.24)
GFR, Estimated: 18 mL/min — ABNORMAL LOW (ref >60)
Glucose, Bld: 119 mg/dL — ABNORMAL HIGH (ref 70–99)
Phosphorus: 11.7 mg/dL — ABNORMAL HIGH (ref 2.5–4.6)
Potassium: 4.6 mmol/L (ref 3.5–5.1)
Sodium: 146 mmol/L — ABNORMAL HIGH (ref 135–145)

## 2022-06-20 LAB — ECHOCARDIOGRAM COMPLETE
Height: 69 in
S' Lateral: 4.9 cm
Weight: 2338.64 oz

## 2022-06-20 LAB — PHOSPHORUS: Phosphorus: 9 mg/dL — ABNORMAL HIGH (ref 2.5–4.6)

## 2022-06-20 LAB — COOXEMETRY PANEL
Carboxyhemoglobin: 1.2 % (ref 0.5–1.5)
Carboxyhemoglobin: 1.1 % (ref 0.5–1.5)
Methemoglobin: <0.7 % (ref 0.0–1.5)
Methemoglobin: 0.8 % (ref 0.0–1.5)
O2 Saturation: 88.2 %
O2 Saturation: 70.1 %
Total hemoglobin: 14.9 g/dL (ref 12.0–16.0)
Total hemoglobin: 14.2 g/dL (ref 12.0–16.0)
Total oxygen content: 86.7 %
Total oxygen content: 68.8 %

## 2022-06-20 LAB — MAGNESIUM: Magnesium: 2.3 mg/dL (ref 1.7–2.4)

## 2022-06-20 LAB — HEPARIN LEVEL (UNFRACTIONATED)
Heparin Unfractionated: 0.68 IU/mL (ref 0.30–0.70)
Heparin Unfractionated: 0.16 IU/mL — ABNORMAL LOW (ref 0.30–0.70)
Heparin Unfractionated: 0.16 IU/mL — ABNORMAL LOW (ref 0.30–0.70)

## 2022-06-20 LAB — CORTISOL: Cortisol, Plasma: 46.3 ug/dL

## 2022-06-20 LAB — PROCALCITONIN: Procalcitonin: 34.94 ng/mL

## 2022-06-20 MED ORDER — SODIUM CHLORIDE 0.9 % IV SOLN
0.0000 ug/min | INTRAVENOUS | Status: DC
  Administered 2022-06-20: 20 ug/min via INTRAVENOUS
  Filled 2022-06-20: qty 10
  Filled 2022-06-20: qty 5

## 2022-06-20 MED ORDER — HEPARIN SODIUM (PORCINE) 1000 UNIT/ML DIALYSIS: 1000.0000 [IU] | INTRAMUSCULAR | Status: DC | PRN

## 2022-06-20 MED ORDER — ORAL CARE MOUTH RINSE: 15.0000 mL | OROMUCOSAL | Status: DC | PRN

## 2022-06-20 MED ORDER — ORAL CARE MOUTH RINSE
15.0000 mL | OROMUCOSAL | Status: DC
  Administered 2022-06-20 (×3): 15 mL via OROMUCOSAL

## 2022-06-20 MED ORDER — SODIUM BICARBONATE 8.4 % IV SOLN
150.0000 meq | Freq: Once | INTRAVENOUS | Status: AC
  Administered 2022-06-20: 100 meq via INTRAVENOUS

## 2022-06-20 MED ORDER — BUDESONIDE 0.5 MG/2ML IN SUSP
0.5000 mg | Freq: Two times a day (BID) | RESPIRATORY_TRACT | Status: DC
Start: 2022-06-20 — End: 2022-06-21
  Administered 2022-06-20: 0.5 mg via RESPIRATORY_TRACT
  Filled 2022-06-20: qty 2

## 2022-06-20 MED ORDER — SODIUM BICARBONATE 8.4 % IV SOLN
INTRAVENOUS | Status: AC
  Administered 2022-06-20: 50 meq
  Filled 2022-06-20: qty 100

## 2022-06-20 MED ORDER — CHLORHEXIDINE GLUCONATE CLOTH 2 % EX PADS
6.0000 | MEDICATED_PAD | Freq: Every day | CUTANEOUS | Status: DC
  Administered 2022-06-20: 6 via TOPICAL

## 2022-06-20 MED ORDER — STERILE WATER FOR INJECTION IV SOLN
INTRAVENOUS | Status: DC
  Filled 2022-06-20: qty 150
  Filled 2022-06-20 (×2): qty 1000
  Filled 2022-06-20: qty 150

## 2022-06-20 MED ORDER — EPINEPHRINE HCL 5 MG/250ML IV SOLN IN NS
0.5000 ug/min | INTRAVENOUS | Status: DC
  Administered 2022-06-20: 0.5 ug/min via INTRAVENOUS
  Filled 2022-06-20 (×2): qty 250

## 2022-06-20 MED ORDER — DEXTROSE 50 % IV SOLN: 25.0000 g | Freq: Once | INTRAVENOUS | Status: AC

## 2022-06-20 MED ORDER — DEXTROSE 50 % IV SOLN
INTRAVENOUS | Status: AC
  Administered 2022-06-20: 25 g via INTRAVENOUS
  Filled 2022-06-20: qty 50

## 2022-06-20 MED ORDER — IPRATROPIUM-ALBUTEROL 0.5-2.5 (3) MG/3ML IN SOLN
3.0000 mL | Freq: Four times a day (QID) | RESPIRATORY_TRACT | Status: DC
  Administered 2022-06-20 (×2): 3 mL via RESPIRATORY_TRACT
  Filled 2022-06-20 (×2): qty 3

## 2022-06-20 MED ORDER — SODIUM BICARBONATE 8.4 % IV SOLN
INTRAVENOUS | Status: AC
  Filled 2022-06-20: qty 100

## 2022-06-20 MED ORDER — DEXTROSE 50 % IV SOLN
50.0000 mL | Freq: Once | INTRAVENOUS | Status: AC
  Administered 2022-06-20: 50 mL via INTRAVENOUS

## 2022-06-20 MED ORDER — DEXTROSE 50 % IV SOLN
12.5000 g | INTRAVENOUS | Status: AC
  Administered 2022-06-20: 12.5 g via INTRAVENOUS

## 2022-06-20 MED ORDER — PRISMASOL BGK 4/2.5 32-4-2.5 MEQ/L EC SOLN: Status: DC

## 2022-06-20 MED ORDER — PRISMASOL BGK 4/2.5 32-4-2.5 MEQ/L REPLACEMENT SOLN: Status: DC

## 2022-06-20 MED ORDER — HEPARIN BOLUS VIA INFUSION
2000.0000 [IU] | Freq: Once | INTRAVENOUS | Status: DC
  Filled 2022-06-20: qty 2000

## 2022-06-20 MED ORDER — HYDROCORTISONE SOD SUC (PF) 100 MG IJ SOLR
100.0000 mg | Freq: Three times a day (TID) | INTRAMUSCULAR | Status: DC
  Administered 2022-06-20 (×2): 100 mg via INTRAVENOUS
  Filled 2022-06-20 (×3): qty 2

## 2022-06-20 MED ORDER — ARTIFICIAL TEARS OPHTHALMIC OINT
TOPICAL_OINTMENT | OPHTHALMIC | Status: DC | PRN
  Filled 2022-06-20: qty 3.5

## 2022-06-20 MED ORDER — PIPERACILLIN-TAZOBACTAM 3.375 G IVPB 30 MIN
3.3750 g | Freq: Four times a day (QID) | INTRAVENOUS | Status: DC
  Administered 2022-06-20 (×2): 3.375 g via INTRAVENOUS
  Filled 2022-06-20 (×5): qty 50

## 2022-06-20 MED ORDER — SODIUM BICARBONATE 8.4 % IV SOLN: 100.0000 meq | Freq: Once | INTRAVENOUS | Status: DC

## 2022-06-20 MED ORDER — SODIUM BICARBONATE 8.4 % IV SOLN
100.0000 meq | Freq: Once | INTRAVENOUS | Status: AC
  Administered 2022-06-20: 100 meq via INTRAVENOUS
  Filled 2022-06-20: qty 100

## 2022-06-20 MED ORDER — MILRINONE LACTATE IN DEXTROSE 20-5 MG/100ML-% IV SOLN
0.1250 ug/kg/min | INTRAVENOUS | Status: DC
  Administered 2022-06-20: 0.125 ug/kg/min via INTRAVENOUS
  Filled 2022-06-20: qty 100

## 2022-06-20 MED ORDER — PHENYLEPHRINE CONCENTRATED 100MG/250ML (0.4 MG/ML) INFUSION SIMPLE
0.0000 ug/min | INTRAVENOUS | Status: DC
  Filled 2022-06-20: qty 250

## 2022-06-20 MED ORDER — DEXTROSE 50 % IV SOLN
INTRAVENOUS | Status: AC
  Filled 2022-06-20: qty 50

## 2022-06-20 MED ORDER — SODIUM BICARBONATE 8.4 % IV SOLN: 50.0000 meq | Freq: Once | INTRAVENOUS | Status: AC

## 2022-06-26 LAB — CULTURE, RESPIRATORY W GRAM STAIN: Gram Stain: NONE SEEN

## 2022-07-04 NOTE — Death Summary Note (Signed)
DEATH SUMMARY   Patient Details  Name: Ricardo Brooks MRN: [id number] DOB: [birth year]  Admission/Discharge Information   Admit Date:  06-22-2022  Date of Death:    Time of Death:    Length of Stay: 1  Referring Physician: Ashley Akin, NP   Reason(s) for Hospitalization  MI, ISCHEMIC CARDIOMYOPATHY  Diagnoses  Preliminary cause of death: MO, ISCHEMIC CARDIOMYOPATHY Secondary Diagnoses (including complications and co-morbidities):  Principal Problem:   Acute respiratory failure with hypoxia and hypercarbia (HCC) Active Problems:   Acute on chronic systolic congestive heart failure ([place])   Acute pulmonary edema [facility])   Brief Hospital Course (including significant findings, care, treatment, and services provided and events leading to death)   Brief Pt Description / Synopsis:  66 y.o. male admitted with Acute Hypoxic & Hypercapnic Respiratory Failure in the setting of acute on chronic HFrEF, NSTEMI, Cardiogenic shock, and AKI likely due to Cardiorenal Syndrome requiring intubation and mechanical ventilation.   History of Present Illness:  Ricardo Brooks is a 66 year old male with a past medical history significant for HFrEF, ischemic cardiomyopathy, hypertension, hyperlipidemia, PTSD, stroke, bipolar disorder, chronic back pain who presented to [facility] ED on 06-22-2022 due to complaints of chest pain, near syncope, and hypotension.   Patient is currently intubated and sedated, unable to contribute to history, and no family is currently available,  therefore history is obtained from chart review.  Per ED and nursing notes, on his worksite today he developed chest pain, near syncope and was found to be hypotensive.  After arrival to the ED he was noted to have escalating FiO2 requirements and acute respiratory distress, failing trial of BiPAP and requiring intubation.  Initially there was concern for cardiogenic shock as a systolic blood pressure dropped to the 50s, but blood pressure  recovered and he has never had to be started on Levophed.   ED Course: Initial Vital Signs: Temperature 97.2 F, respiratory rate 32, pulse 60, blood pressure 95/69, SPO2 93% Significant Labs: Potassium 3.3, glucose 162, hemoglobin 11.9, high-sensitivity troponin 516, BNP 361, INR 1.1 VBG: pH 7.22/P CO2 62/PO2 less than 31/bicarb 25.4 Urinalysis and tox screen pending Imaging Chest X-ray>>revealed moderate pulmonary edema EKG:  sinus brady, RBBB, unifocal PVC, ST depression and t wave inversion in v5 and v6, LVH with early repolarization Medications Administered: 1 L normal saline bolus, 80 mg IV Lasix, heparin bolus and drip, Narcan 1 mg x 1 dose   Pertinent  Medical History        Past Medical History:  Diagnosis Date   Arthritis      "right hip" (06/25/2018)   Bipolar 1 disorder (HCC)     CHF (congestive heart failure) (HCC)     Childhood asthma     Chronic lower back pain     Headache      "monthly" (06/25/2018)   High cholesterol     Hip pain, chronic     Hypertension     Pneumonia 1990s X 1   PTSD (post-traumatic stress disorder)     Stroke ([place]) 06/24/2018    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  PCCM asked to admit.  Cardiology consulted for elevated troponin/CHF. 7/18: Critically ill, on multiple pressors. Severe  metabolic acidosis requiring bicarb gtt.  Nephrology consulted, plan to initiate CRRT.  Left IJ trialysis catheter placed.    Severe Acute Hypoxic & Hypercapnic Respiratory Failure in the setting of Pulmonary Edema  -Full vent support, implement lung protective strategies -Plateau pressures less than 30 cm H20 -Wean FiO2 & PEEP as tolerated to  maintain O2 sats >92% -Follow intermittent Chest X-ray & ABG as needed -Spontaneous Breathing Trials when respiratory parameters met and mental status permits -Implement VAP Bundle -Bronchodilators & Pulmicort nebs -Diuresis as blood pressure and renal function permits (received 80 mg IV Lasix x1 dose in ED) ~ unable to tolerate currently due to hypotension   Acute on Chronic HFrEF (LVEF 30 to 35%) Elevated troponin in setting of NSTEMI Shock: Suspect Cardiogenic PMHx: Ischemic cardiomyopathy, LV thrombus, hypertension, hyperlipidemia -Continuous cardiac monitoring -Maintain MAP >65 -Cautious IV fluids (received 1 L normal saline bolus in ED) ~ Follow CVP  -Vasopressors as needed to maintain MAP goal -Check cortisol, add stress dose steroids -Trend lactic acid until normalized (7.4 ~ 5.5 ~ 4.0 ~ >9.0 ~ >9.0 ~ ) -Trend HS Troponin until peaked (>24,000 ) -Urine drug screen +for benzo's & opiates (received ativan and fentanyl in ED) -BNP 361 at admission -Repeat echocardiogram pending -Cardiology following, appreciate input ~ plan to add Milrinone -Heparin drip for anticoagulation -Diuresis as blood pressure and renal function permits ~ holding due to shock   AKI, suspect Cardiorenal Syndrome Severe Anion Gap Metabolic Acidosis in setting of AKI and Lactic Acidosis -Monitor I&O's / urinary output -Follow BMP -Ensure adequate renal perfusion -Avoid nephrotoxic agents as able -Replace electrolytes as indicated -Trend lactic acid -Bicarb gtt -Nephrology consulted, appreciate input, likely will need CRRT   Leukocytosis, suspect reactive ? -Monitor fever curve -Trend WBC's & Procalcitonin -Follow cultures as above -Urinalysis unimpressive, chest x-ray and frothy white secretions seems more consistent with pulmonary edema  ~ however Will obtain tracheal aspirate -concern also for possible gut ischemia given severe lactic acidosis -Start empiric Zosyn pending cultures &  sensitivities   Acute Metabolic Encephalopathy Sedation needs in the setting of mechanical ventilation PMHx: Bipolar disorder -Maintain a RASS goal of -3 to -4 given critical illness/severe hypoxia and need for ventilator synchrony -Fentanyl and Versed as needed to maintain RASS goal -As needed vecuronium as needed to ensure ventilator synchrony -Avoid sedating medications as able -Daily wake up assessment   Hyperglycemia -CBG's q4h; Target range of 140 to 180 -SSI -Follow ICU Hypo/Hyperglycemia protocol -Check hemoglobin A1c       GOALS OF CARE DISCUSSION   The Clinical status was relayed to daughter in detail-   Updated and notified of patients medical condition- Patient remains unresponsive and will not open eyes to command.   Patient is having a weak cough and struggling to remove secretions.   Patient with increased WOB and using accessory muscles to breathe Explained to family course of therapy and the modalities    Patient with Progressive multiorgan failure with a very high probablity of a very minimal chance of meaningful recovery despite all aggressive and optimal medical therapy.    Oxygen saturation 76% on the ventilator. Arterial MAPs dropping to 59.  order to start epi gtt and give one amp of bicarb. Verbal order for levo titration range 0-200 mcg.    Patient with MASSIVE MI with MULTIORGAN FAILURE ACIDOSIS IS INCOMPATIBLE WITH LIFE MULTIPLE PRESSORS STARTED SEVERE HYPOXIA DESPITE FULL VEN SUPPORT DAUGHTER CHARISMA UPDATED SEVERAL TIMES VASC CATH PLACED FOR CRRT  Pt PEA arrested at 1903 despite maximal doses of vasopressin, levophed, neo-synephrine, and epinephrine gtts.  ACLS protocol initiated and CRRT stopped.  Briefly obtained ROSC cardiac rhythm sinus bradycardia hr low 50's.  During code blue notified pts daughter Leroy Pettway via telephone to discuss decline in pts condition, and pts daughter changed code status to DO NOT RESUSCITATE.  Time  of death 45.    Pertinent Labs and Studies  Significant Diagnostic Studies DG Chest Port 1 View  Result Date: 07/10/22 CLINICAL DATA:  Central line placement EXAM: PORTABLE CHEST 1 VIEW COMPARISON:  07/10/22 6 a.m. FINDINGS: New left IJ central line tip overlies SVC. Endotracheal tube, enteric tube, and right IJ central line again seen. No pneumothorax. Persistent bilateral opacities. Aeration is similar. Probable right pleural effusion is again noted. Similar cardiomediastinal contours. IMPRESSION: New left IJ line tip overlies SVC. No pneumothorax. Similar aeration with bilateral opacities and probable right pleural effusion. Electronically Signed   By: Macy Mis M.D.   On: July 10, 2022 12:06   ECHOCARDIOGRAM COMPLETE  Result Date: 2022-07-10    ECHOCARDIOGRAM REPORT   Patient Name:   Ricardo Brooks Date of Exam: 10-Jul-2022 Medical Rec #:  [id number]    Height:       69.0 in Accession #:    [id number]   Weight:       146.2 lb Date of Birth:  07/12/1956    BSA:          1.808 m Patient Age:    66 years     BP:           113/91 mmHg Patient Gender: M            HR:           65 bpm. Exam Location:  [facility] Procedure: 2D Echo, Cardiac Doppler and Color Doppler Indications:     CHF-acute systolic O87.86  History:         Patient has prior history of Echocardiogram examinations, most                  recent 05/25/2021. CHF, CAD; Risk Factors:Current Smoker,                  Hypertension and Dyslipidemia.  Sonographer:     Sherrie Sport Referring Phys:  [id number] SHERI HAMMOCK Diagnosing Phys: Nelva Bush MD  Sonographer Comments: Echo performed with patient supine and on artificial respirator. IMPRESSIONS  1. Left ventricular ejection fraction, by estimation, is 25 to 30%. The left ventricle has severely decreased function. The left ventricle demonstrates regional wall motion abnormalities (see scoring diagram/findings for description). Left ventricular diastolic function could not be evaluated. There is  akinesis of the left ventricular, entire inferoseptal wall, inferior wall and inferolateral wall.  2. Right ventricular systolic function is normal. The right ventricular size is normal.  3. Left atrial size was moderately dilated.  4. A small pericardial effusion is present. The pericardial effusion is localized near the right ventricle.  5. The mitral valve is abnormal. Moderate mitral valve regurgitation.  6. Tricuspid valve regurgitation is mild to moderate.  7. The aortic valve was not well visualized. FINDINGS  Left Ventricle: Left ventricular dimensions not adequately assessed on this study. Left ventricular ejection fraction, by estimation, is 25 to 30%. The left ventricle has severely decreased function. The left ventricle demonstrates regional wall motion abnormalities. Left ventricular diastolic function could not be evaluated. Right Ventricle: The right ventricular size is normal. Right vetricular wall thickness  was not well visualized. Right ventricular systolic function is normal. Left Atrium: Left atrial size was moderately dilated. Right Atrium: Right atrial size was normal in size. Pericardium: A small pericardial effusion is present. The pericardial effusion is localized near the right ventricle. Mitral Valve: The mitral valve is abnormal. There is mild thickening of the mitral valve leaflet(s). Mild mitral annular calcification. Moderate mitral valve regurgitation. Tricuspid Valve: The tricuspid valve is not well visualized. Tricuspid valve regurgitation is mild to moderate. Aortic Valve: The aortic valve was not well visualized. Pulmonic Valve: The pulmonic valve was not well visualized. Aorta: The aortic root was not well visualized. Venous: IVC assessment for right atrial pressure unable to be performed due to mechanical ventilation. IAS/Shunts: No atrial level shunt detected by color flow Doppler.  LEFT VENTRICLE PLAX 2D LVIDd:         5.30 cm LVIDs:         4.90 cm LV PW:         2.10 cm LV  IVS:        1.40 cm  TRICUSPID VALVE TR Peak grad:   39.9 mmHg TR Vmax:        316.00 cm/s Nelva Bush MD Electronically signed by Nelva Bush MD Signature Date/Time: 07/14/2022/9:31:41 AM    Final    DG Chest Port 1 View  Result Date: 07-14-22 CLINICAL DATA:  Acute respiratory failure. EXAM: PORTABLE CHEST 1 VIEW COMPARISON:  07/02/2022 FINDINGS: 0603 hours. Endotracheal tube tip is approximately 6.6 cm above the base of the carina. The NG tube passes into the stomach although the distal tip position is not included on the film. Right IJ central line tip overlies the distal SVC level, new in the interval. Diffuse right greater than left airspace disease is again noted, slightly progressive at the right base. Cardiopericardial silhouette is at upper limits of normal for size. Telemetry leads overlie the chest. IMPRESSION: 1. New right IJ central line tip overlies the distal SVC level. No pneumothorax 2. Diffuse bilateral airspace disease, right greater than left. Features compatible with asymmetric edema or diffuse infection. Electronically Signed   By: Misty Stanley M.D.   On: 2022/07/14 06:32   DG Chest Portable 1 View  Result Date: 06/25/2022 CLINICAL DATA:  Provided history: Intubation. Encounter for intubation and OG tube placement. EXAM: PORTABLE CHEST 1 VIEW COMPARISON:  Prior chest radiograph 06/25/2022 and earlier. FINDINGS: Interval intubation. The ET tube terminates just below level of the clavicular heads. An enteric tube is present with tip projecting at the level of the gastric cardia, and side port at the level of the distal esophagus. Mild cardiomegaly. Since the chest radiograph performed earlier today, there has been significant progression of interstitial and airspace opacities throughout both lungs. No evidence of pleural effusion or pneumothorax. No acute bony abnormality identified. Degenerative changes of the spine. Impression #1 called by telephone at the time of  interpretation on 06/07/2022 at 1:26 pm to provider Dr. Rip Harbour, who verbally acknowledged these results. IMPRESSION: 1. The enteric tube terminates at the level of the gastric cardia (with side port at the level of the distal esophagus). Consider tube advancement. 2. ET tube present with tip projecting just below the level of the clavicular heads. 3. Significant interval progression of interstitial and airspace opacities throughout both lungs. This may reflect edema. However, correlate clinically to exclude signs/symptoms that would alternatively suggest pneumonia. 4. Mild cardiomegaly. Electronically Signed   By: Kellie Simmering D.O.   On: 06/25/2022 13:29  DG Chest Portable 1 View  Result Date: 06/07/2022 CLINICAL DATA:  Altered mental status EXAM: PORTABLE CHEST 1 VIEW COMPARISON:  Chest x-ray dated March 12 20,019 FINDINGS: Unchanged cardiomegaly. Moderate bilateral interstitial and airspace opacities. No large pleural effusion or pneumothorax. IMPRESSION: Moderate pulmonary edema. Electronically Signed   By: Yetta Glassman M.D.   On: 06/18/2022 12:22    Microbiology Recent Results (from the past 240 hour(s))  MRSA Next Gen by PCR, Nasal     Status: None   Collection Time: 06/06/2022  4:00 PM   Specimen: Nasal Mucosa; Nasal Swab  Result Value Ref Range Status   MRSA by PCR Next Gen NOT DETECTED NOT DETECTED Final    Comment: (NOTE) The GeneXpert MRSA Assay (FDA approved for NASAL specimens only), is one component of a comprehensive MRSA colonization surveillance program. It is not intended to diagnose MRSA infection nor to guide or monitor treatment for MRSA infections. Test performance is not FDA approved in patients less than 83 years old. Performed at [facility], [place] [place], [place] [address]     Lab Basic Metabolic Panel: Recent Labs  Lab 06/26/2022 1202 07/02/2022 2003 2022/07/02 0448 2022/07/02 1031 07/02/2022 1619  NA 140  --  140 146* 146*  K 3.3*  --  5.0  4.2 4.6  CL 106  --  107 104 100  CO2 22  --  16* 17* 17*  GLUCOSE 162*  --  153* 43* 119*  BUN 11  --  25* 28* 31*  CREATININE 1.13  --  2.71* 3.34* 3.62*  CALCIUM 8.6*  --  7.7* 7.2* 6.8*  MG  --  2.2 2.3  --   --   PHOS  --  8.1* 9.0*  --  11.7*   Liver Function Tests: Recent Labs  Lab 06/09/2022 1202 07-02-22 1619  AST 42*  --   ALT 25  --   ALKPHOS 53  --   BILITOT 0.7  --   PROT 6.7  --   ALBUMIN 3.5 1.8*   No results for input(s): "LIPASE", "AMYLASE" in the last 168 hours. No results for input(s): "AMMONIA" in the last 168 hours. CBC: Recent Labs  Lab 06/26/2022 1202 07/02/2022 0448 07/02/22 1031  WBC 6.4 11.3* 9.1  NEUTROABS  --   --  6.4  HGB 11.9* 14.5 12.7*  HCT 39.2 49.0 43.9  MCV 99.5 99.8 101.6*  PLT 187 148* 115*   Cardiac Enzymes: No results for input(s): "CKTOTAL", "CKMB", "CKMBINDEX", "TROPONINI" in the last 168 hours. Sepsis Labs: Recent Labs  Lab 06/25/2022 1202 06/26/2022 1600 06/05/2022 1711 06/26/2022 2003 July 02, 2022 0448 Jul 02, 2022 1031 07/02/2022 1729  PROCALCITON  --  <0.10  --   --   --  34.94  --   WBC 6.4  --   --   --  11.3* 9.1  --   LATICACIDVEN  --  7.4*   < > 4.0* >9.0* >9.0* >9.0*   < > = values in this interval not displayed.       Flora Lipps 07-02-22, 8:24 PM

## 2022-07-04 NOTE — Progress Notes (Signed)
Ricardo Brooks picked up pt belongings including phone.

## 2022-07-04 NOTE — IPAL (Signed)
  Interdisciplinary Goals of Care Family Meeting   Date carried out: 2022-07-17  Location of the meeting: Bedside  Member's involved: Physician and Family Member or next of kin      GOALS OF CARE DISCUSSION  The Clinical status was relayed to family in detail-Daughter Charisma  Updated and notified of patients medical condition- Patient remains unresponsive and will not open eyes to command.   Patient is having a weak cough and struggling to remove secretions.   Patient with increased WOB and using accessory muscles to breathe Explained to family course of therapy and the modalities   Patient with Progressive multiorgan failure with a very high probablity of a very minimal chance of meaningful recovery despite all aggressive and optimal medical therapy.  PATIENT REMAINS FULL CODE  SEVERE HEART ATTACK WITH MULTIORGAN FAILURE PATIENT IS DYING PROCESS  Family are satisfied with Plan of action and management. All questions answered  Additional CC time 25 mins   Keoshia Steinmetz Santiago Glad, M.D.  [facility]  Medical Director [facility] Medical Director [facility] Cardio-Pulmonary Department

## 2022-07-04 NOTE — Progress Notes (Signed)
More pressors administered and titrated to MAP greater than 65 and CRRT initiated. Pt became hypotensive with CRRT and pressors titrated to max dose. Initially MAP came up to 65 but did not last long before becoming hypotensive again. MAP 53. Rn contacting MD.

## 2022-07-04 NOTE — Progress Notes (Signed)
ANTICOAGULATION CONSULT NOTE - Initial Consult  Pharmacy Consult for Heparin Drip Indication:  ACS/STEMI  Allergies  Allergen Reactions   Methocarbamol Nausea And Vomiting and Anxiety    Patient Measurements: Height: 5\' 9"  (175.3 cm) Weight: 66.3 kg (146 lb 2.6 oz) IBW/kg (Calculated) : 70.7 Heparin Dosing Weight: 66.3 kg  Vital Signs: Temp: 97 F (36.1 C) (07/18 1130) Temp Source: Bladder (07/18 0400) BP: 104/83 (07/18 0400) Pulse Rate: 110 (07/18 1130)  Labs: Recent Labs    06/10/2022 1202 07/03/2022 1600 07/01/2022 2003 07-01-2022 0448 07-01-2022 1031  HGB 11.9*  --   --  14.5 12.7*  HCT 39.2  --   --  49.0 43.9  PLT 187  --   --  148* 115*  LABPROT 14.9  --   --   --   --   INR 1.2  --   --   --   --   HEPARINUNFRC  --   --  0.97* 0.68 0.16*  CREATININE 1.13  --   --  2.71*  --   TROPONINIHS 516* 2,532* >24,000*  --   --      Estimated Creatinine Clearance: 26.2 mL/min (A) (by C-G formula based on SCr of 2.71 mg/dL (H)).   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 Pharmacy consulted for Heparin dosing.  Date Time  HL Rate/Comment 0717 2003 0.97 Supratherapeutic; 800 > 650 un/hr 0718 0448 0.68 Therapeutic x1; 650 un/hr 0718 0900 -- Heparin paused for CVVH cath insertion. 01-20-1991 1200 -- Heparin resumed       Baseline Labs: aPTT - unk INR - 1.2 Hgb - 14.5>12.7 Plts - 187>148>115  Goal of Therapy:  Heparin level 0.3-0.7 units/ml Monitor  platelets by anticoagulation protocol: Yes   Plan:  Heparin therapeutic x1;  Resumed heparin infusion at 650 units/hr w/o bolus post-Cath insersion. Check anti-Xa level in 6 hours to confirm since previously therapeutic at this rate. Continue to monitor H&H and platelets  Thank you for allowing pharmacy to be a part of this patient's care.  [id number], PharmD, [facility] Clinical Pharmacist July 01, 2022 1:38 PM

## 2022-07-04 NOTE — Progress Notes (Signed)
This RN and Corey Skains, RN present at bedside. Patient hypotensive and continuing to drop. Patient's heart rate began to brady until asystole at 1903. Code blue called. CPR started immediately. MD at bedside. Daughter being notified. Patient's daughter verbalized DNR on phone with provider. ROSC achieved x1 for brief period, asystole again at 1910. Time of Death 1910.

## 2022-07-04 NOTE — Significant Event (Signed)
Code Blue  Pt PEA arrested at 1903 despite maximal doses of vasopressin, levophed, neo-synephrine, and epinephrine gtts.  ACLS protocol initiated and CRRT stopped.  Briefly obtained ROSC cardiac rhythm sinus bradycardia hr low 50's.  During code blue notified pts daughter Kadir Azucena via telephone to discuss decline in pts condition, and pts daughter changed code status to DO NOT RESUSCITATE.  Time of death 31.  Zada Girt, AGNP  Pulmonary/Critical Care Pager [phone] (please enter 7 digits) PCCM Consult Pager [phone] (please enter 7 digits)

## 2022-07-04 NOTE — Progress Notes (Signed)
ANTICOAGULATION CONSULT NOTE - Initial Consult  Pharmacy Consult for Heparin Drip Indication:  ACS/STEMI  Allergies  Allergen Reactions   Methocarbamol Nausea And Vomiting and Anxiety    Patient Measurements: Height: 5\' 9"  (175.3 cm) Weight: 66.3 kg (146 lb 2.6 oz) IBW/kg (Calculated) : 70.7 Heparin Dosing Weight: 66.3 kg  Vital Signs: Temp: 99.5 F (37.5 C) (07/17 2300) Temp Source: Bladder (07/17 2000) BP: 113/81 (07/17 2300) Pulse Rate: 103 (07/17 2300)  Labs: Recent Labs    June 20, 2022 1202 06/10/2022 1600 20-Jun-2022 2003  HGB 11.9*  --   --   HCT 39.2  --   --   PLT 187  --   --   LABPROT 14.9  --   --   INR 1.2  --   --   HEPARINUNFRC  --   --  0.97*  CREATININE 1.13  --   --   TROPONINIHS 516* 2,532* >24,000*     Estimated Creatinine Clearance: 62.7 mL/min (by C-G formula based on SCr of 1.13 mg/dL).   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 Pharmacy consulted for Heparin dosing.  Goal of Therapy:  Heparin level 0.3-0.7 units/ml Monitor platelets by anticoagulation protocol: Yes   Plan:  Heparin therapeutic x 1  Continue heparin infusion at 650 units/hr Check anti-Xa level in 6 hours to confirm Continue to monitor H&H and platelets  Thank you for allowing pharmacy to be a part of this patient's care.  66yo, PharmD, BCPS Clinical Pharmacist   06/10/2022 5:16 AM

## 2022-07-04 NOTE — Progress Notes (Signed)
Progress Note  Patient Name: Ricardo Brooks Date of Encounter: 07/05/22  [facility] Cardiologist: Nanetta Batty, MD   Subjective   Patient intubated and sedated.  He has become progressively more hypotensive overnight, currently on norepinephrine and vasopressiin infusions.  Inpatient Medications    Scheduled Meds:  Chlorhexidine Gluconate Cloth  6 each Topical Daily   docusate  100 mg Per Tube BID   insulin aspart  0-15 Units Subcutaneous Q4H   pantoprazole (PROTONIX) IV  40 mg Intravenous QHS   polyethylene glycol  17 g Per Tube Daily   Continuous Infusions:  sodium chloride     fentaNYL infusion INTRAVENOUS 200 mcg/hr (05-Jul-2022 0501)   heparin 650 Units/hr (06/06/2022 2213)   midazolam 0.5 mg/hr (06/27/2022 2031)   milrinone     norepinephrine (LEVOPHED) Adult infusion 20 mcg/min (06/08/2022 2028)   sodium bicarbonate 150 mEq in sterile water 1,150 mL infusion 100 mL/hr at July 05, 2022 0653   vasopressin 0.03 Units/min (07/05/22 0130)   PRN Meds: artificial tears, docusate, fentaNYL, midazolam, ondansetron (ZOFRAN) IV, polyethylene glycol, vecuronium   Vital Signs    Vitals:   05-Jul-2022 0300 2022/07/05 0400 07-05-22 0500 05-Jul-2022 0600  BP:  104/83    Pulse: 70 100    Resp: (!) 28 (!) 28 (!) 28 (!) 0  Temp: 99.9 F (37.7 C) 99.5 F (37.5 C) 99.3 F (37.4 C) 99.1 F (37.3 C)  TempSrc:  Bladder    SpO2: (!) 86% (!) 89% (!) 89%   Weight:      Height:        Intake/Output Summary (Last 24 hours) at Jul 05, 2022 0724 Last data filed at 2022/07/05 0500 Gross per 24 hour  Intake 1567.38 ml  Output 230 ml  Net 1337.38 ml      06/27/2022   12:45 PM 03/09/2021    2:58 PM 07/25/2020    1:44 PM  Last 3 Weights  Weight (lbs) 146 lb 2.6 oz 140 lb 137 lb  Weight (kg) 66.3 kg 63.504 kg 62.143 kg      Telemetry    Sinus rhythm with PVC's and 9-beat run of NSVT - Personally Reviewed  ECG    NSR with PVC's and widespread ST depression - Personally Reviewed  Physical Exam    GEN: Intubated and sedated Neck: Unable to assess JVP due to positioning and support equipment. Cardiac: RRR, no murmurs, rubs, or gallops.  Respiratory: Coarse breath sounds anteriorly with scattered rhonchi GI: Soft, nontender, non-distended  MS: No edema; Cool distal extremities Neuro:  Intubated and sedated.  Does not respond to noxious stimuli Psych: Unable to assess as patient is intubated and sedated  Labs    High Sensitivity Troponin:   Recent Labs  Lab 06/15/2022 1202 06/17/2022 1600 06/18/2022 2003  TROPONINIHS 516* 2,532* >24,000*     Chemistry Recent Labs  Lab 06/03/2022 1202 06/04/2022 2003 2022-07-05 0448  NA 140  --  140  K 3.3*  --  5.0  CL 106  --  107  CO2 22  --  16*  GLUCOSE 162*  --  153*  BUN 11  --  25*  CREATININE 1.13  --  2.71*  CALCIUM 8.6*  --  7.7*  MG  --  2.2 2.3  PROT 6.7  --   --   ALBUMIN 3.5  --   --   AST 42*  --   --   ALT 25  --   --   ALKPHOS 53  --   --  BILITOT 0.7  --   --   GFRNONAA >60  --  26*  ANIONGAP 12  --  17*    Lipids No results for input(s): "CHOL", "TRIG", "HDL", "LABVLDL", "LDLCALC", "CHOLHDL" in the last 168 hours.  Hematology Recent Labs  Lab 06/28/2022 1202 July 11, 2022 0448  WBC 6.4 11.3*  RBC 3.94* 4.91  HGB 11.9* 14.5  HCT 39.2 49.0  MCV 99.5 99.8  MCH 30.2 29.5  MCHC 30.4 29.6*  RDW 14.9 15.2  PLT 187 148*   Thyroid No results for input(s): "TSH", "FREET4" in the last 168 hours.  BNP Recent Labs  Lab 06/03/2022 1202  BNP 361.1*    DDimer No results for input(s): "DDIMER" in the last 168 hours.   Radiology    DG Chest Port 1 View  Result Date: 2022-07-11 CLINICAL DATA:  Acute respiratory failure. EXAM: PORTABLE CHEST 1 VIEW COMPARISON:  07/03/2022 FINDINGS: 0603 hours. Endotracheal tube tip is approximately 6.6 cm above the base of the carina. The NG tube passes into the stomach although the distal tip position is not included on the film. Right IJ central line tip overlies the distal SVC level, new  in the interval. Diffuse right greater than left airspace disease is again noted, slightly progressive at the right base. Cardiopericardial silhouette is at upper limits of normal for size. Telemetry leads overlie the chest. IMPRESSION: 1. New right IJ central line tip overlies the distal SVC level. No pneumothorax 2. Diffuse bilateral airspace disease, right greater than left. Features compatible with asymmetric edema or diffuse infection. Electronically Signed   By: Kennith Center M.D.   On: 07-11-22 06:32   DG Chest Portable 1 View  Result Date: 06/26/2022 CLINICAL DATA:  Provided history: Intubation. Encounter for intubation and OG tube placement. EXAM: PORTABLE CHEST 1 VIEW COMPARISON:  Prior chest radiograph 06/10/2022 and earlier. FINDINGS: Interval intubation. The ET tube terminates just below level of the clavicular heads. An enteric tube is present with tip projecting at the level of the gastric cardia, and side port at the level of the distal esophagus. Mild cardiomegaly. Since the chest radiograph performed earlier today, there has been significant progression of interstitial and airspace opacities throughout both lungs. No evidence of pleural effusion or pneumothorax. No acute bony abnormality identified. Degenerative changes of the spine. Impression #1 called by telephone at the time of interpretation on 06/29/2022 at 1:26 pm to provider Dr. Juliette Alcide, who verbally acknowledged these results. IMPRESSION: 1. The enteric tube terminates at the level of the gastric cardia (with side port at the level of the distal esophagus). Consider tube advancement. 2. ET tube present with tip projecting just below the level of the clavicular heads. 3. Significant interval progression of interstitial and airspace opacities throughout both lungs. This may reflect edema. However, correlate clinically to exclude signs/symptoms that would alternatively suggest pneumonia. 4. Mild cardiomegaly. Electronically Signed   By:  Jackey Loge D.O.   On: 07/03/2022 13:29   DG Chest Portable 1 View  Result Date: 06/15/2022 CLINICAL DATA:  Altered mental status EXAM: PORTABLE CHEST 1 VIEW COMPARISON:  Chest x-ray dated March 12 20,019 FINDINGS: Unchanged cardiomegaly. Moderate bilateral interstitial and airspace opacities. No large pleural effusion or pneumothorax. IMPRESSION: Moderate pulmonary edema. Electronically Signed   By: Allegra Lai M.D.   On: 06/25/2022 12:22    Cardiac Studies   TTE (05/25/2021):  1. Left ventricular ejection fraction, by estimation, is 30 to 35%. The  left ventricle has moderately decreased function. The left  ventricle  demonstrates regional wall motion abnormalities (see scoring  diagram/findings for description). The left  ventricular internal cavity size was moderately dilated. Left ventricular  diastolic parameters are indeterminate.   2. Right ventricular systolic function is normal. The right ventricular  size is normal. Tricuspid regurgitation signal is inadequate for assessing  PA pressure.   3. The mitral valve is normal in structure. Mild mitral valve  regurgitation. No evidence of mitral stenosis.   4. The aortic valve is tricuspid. Aortic valve regurgitation is not  visualized. No aortic stenosis is present.   5. The inferior vena cava is normal in size with greater than 50%  respiratory variability, suggesting right atrial pressure of 3 mmHg.   Patient Profile     66 y.o. male CAD with CTO of ostial LAD, chronic HFrEF due to ICM with nonviable LAD territory by cardiac MRI, HTN, HLD, CVA, PTSD, and bipolar disorder, admitted with NSTEMI and acute on chronic HFrEF.  Assessment & Plan    NSTEMI: EKG's concerning for global ischemia.  He has known CTO of ostial LAD by cath iin 2019.  HS-TnI has risen precipitously; unclear if this represents acute plaque rupture MI versus marked global ischemia in the setting of cardiogenic shock and acute on chronic HFrEF. - Patient  currently too unstable for cath. - Continue IV heparin and aspirin. - Restart statin when taking enteric meds/nutrition consistently.  Cardiogenic shock and acute on chronic HFrEF: Worsening hypotension overnight with evidence of global hypoperfusion and multiorgan failure (AKI, severe acidosis, severely elevated lactate).  CXR this AM shows persistent asymmetric pulmonary edema.  Patient currently on norepinephrine and vasopressin infusions. - Obtain COOX and CVP from CVC. - Initiate milrinone (start at 0.125 mcg/kg/min given hypotension and AKI) with serial SvO2.  If worsening hypotension becomes an issue, we could consider trial of dobutamine instead that this could exacerbate tachycardia/ectopy. - Correct acidosis with bicarbonate +/- CVVH. - Could consider IABP, though this would necessitate transport to [facility] after IABP placement; patient is currently too unstable for transport.  This can be readdressed if he responds favorably to correction of acidosis and inotropic support. - Consult nephrology for consideration of CVVHD. - TTE pending.  Acute respiratory failure with hypoxia: Most likely driven by pulmonary edema from acute on chronic HFrEF. - Vent management per CCM. - Consider CVVH for fluid removal as tolerated. - Management of HF and cardiogenic shock, as above.  History of LV thrombus: Noted on cardiac MRI in 2019.  No mention of thrombus on most recent echo from 2022.  Patient was to be on warfarin but has been noncompliant with meds for at least a few months (supported by INR of 1.2 and undetectable digoxin level yesterday). - Continue IV heparin. - Repeat echo pending.  Low threshold for administration of Definity. - Not a good candidate for Impella support unless clearly no evidence of LV thrombus and patient able to be transferred to [facility] for definitive therapy.  CRITICAL CARE Performed by: Yvonne Kendall, MD  Total critical care time: 45 minutes  Critical  care time was exclusive of separately billable procedures and treating other patients.  Critical care was necessary to treat or prevent imminent or life-threatening deterioration.  Critical care was time spent personally by me (independent of midlevel providers or residents) on the following activities: development of treatment plan with patient and/or surrogate as well as nursing, discussions with consultants, evaluation of patient's response to treatment, examination of patient, obtaining history from patient or  surrogate, ordering and performing treatments and interventions, ordering and review of laboratory studies, ordering and review of radiographic studies, pulse oximetry and re-evaluation of patient's condition.    For questions or updates, please contact [facility] Please consult [email or web address] for contact info under [facility].  Signed, Yvonne Kendall, MD  2022/07/20, 7:24 AM

## 2022-07-04 NOTE — Progress Notes (Signed)
Initial Nutrition Assessment  DOCUMENTATION CODES:   Not applicable  INTERVENTION:   -If unable to extubate within 48 hours, recommend:  Initiate Vital 1.5 @ 20 ml/hr via OGT and increase by 10 ml every 4 hours to goal rate of 50 ml/hr.   90 ml Prosource TF BID.    30 ml free water flush every 4 hours to maintain tube patency  Tube feeding regimen provides 1960 kcal (100% of needs), 125 grams of protein, and 917 ml of H2O. Total free water:L 1097 ml daily  NUTRITION DIAGNOSIS:   Inadequate oral intake related to inability to eat as evidenced by NPO status.  GOAL:   Patient will meet greater than or equal to 90% of their needs  MONITOR:   Vent status  REASON FOR ASSESSMENT:   Ventilator    ASSESSMENT:   Pt admitted with Acute Hypoxic & Hypercapnic Respiratory Failure in the setting of acute on chronic HFrEF requiring intubation and mechanical ventilation, along with elevated troponins in the setting of demand ischemia versus NSTEMI.  Patient is currently intubated on ventilator support. OGT placement verified by x-ray; currently connected to low, intermittent suction.  MV: 13.9 L/min Temp (24hrs), Avg:98.4 F (36.9 C), Min:96 F (35.6 C), Max:100 F (37.8 C)  MAP: 90   Reviewed I/O's: +1.3 L x 24 hours  UOP: 80 ml x 24 hours  NGT output: 150 ml x 24 hours  Case discussed with RN, MD, and during ICU rounds. Pt with cariogenic shock and pulmonary edema. Likely plan to start CRRT today. Per MD, no plans to start TF today, but will re-assess in 24 hours. MD hopeful for improvement in renal function and response to CRRT prior to starting feeds.   Reviewed wt hx; no wt loss noted.   Medications reviewed and include solu-cortef, colace, miralax, fentanyl, versed, milrinone, levophed, and vasopressin.   Labs reviewed: CBGS: 89 (inpatient orders for glycemic control are 0-15 units insulin aspart every 4 hours).    NUTRITION - FOCUSED PHYSICAL EXAM:  Flowsheet Row  Most Recent Value  Orbital Region Mild depletion  Upper Arm Region No depletion  Thoracic and Lumbar Region No depletion  Buccal Region No depletion  Temple Region Mild depletion  Clavicle Bone Region No depletion  Clavicle and Acromion Bone Region No depletion  Scapular Bone Region No depletion  Dorsal Hand No depletion  Patellar Region Moderate depletion  Anterior Thigh Region Moderate depletion  Posterior Calf Region Moderate depletion  Edema (RD Assessment) None  Hair Reviewed  Eyes Reviewed  Mouth Reviewed  Skin Reviewed  Nails Reviewed       Diet Order:   Diet Order             Diet NPO time specified  Diet effective now                   EDUCATION NEEDS:   Not appropriate for education at this time  Skin:  Skin Assessment: Reviewed RN Assessment  Last BM:  Unknown  Height:   Ht Readings from Last 1 Encounters:  07-15-2022 5\' 9"  (1.753 m)    Weight:   Wt Readings from Last 1 Encounters:  07-15-2022 66.3 kg    Ideal Body Weight:  72.7 kg  BMI:  Body mass index is 21.58 kg/m.  Estimated Nutritional Needs:   Kcal:  1926  Protein:  100-135 grams  Fluid:  > 1.9 L    06/21/22, RD, LDN, CDCES Registered Dietitian II Certified Diabetes  Care and Education Specialist Please refer to [facility] for RD and/or RD on-call/weekend/after hours pager

## 2022-07-04 NOTE — Consult Note (Signed)
[facility]  CONSULT NOTE    Date: 06/28/2022                  Patient Name:  Ricardo Brooks  MRN: [id number]  DOB: Feb 10, 1956  Age / Sex: 66 y.o., male         PCP: Wilmer FloorHorton, Lovika, NP                 Service Requesting Consult: Critical care                 Reason for Consult: Acute kidney injury            History of Present Illness: Mr. Ricardo CabalDavid Bergdoll is a 66 y.o.  male with past medical history including hyperlipidemia, congestive heart failure, hypertension, stroke, PTSD, and bipolar disorder, who was [facility] on [facility]02-2022 for Acute respiratory failure with hypoxia and hypercarbia (HCC) [J96.01, J96.02]   Patient presents to the emergency department with complaints of hypotension, syncopal episode and chest pain.  Patient is currently seen and evaluated in ICU.  Currently intubated on 100% FiO2.  Current sedation with fentanyl.  Pressors in place with vaso and levo.  Heparin drip and milrinone also prescribed.  Chart review conducted to gather history.  Patient appears to present with EMS from work when symptoms began.On presentation to the emergency department patient was placed on 2 L nasal cannula.  This quickly progressed to patient being placed on BiPAP prior to intubation by ED physician..  Labs in ED include sodium 140, potassium 3.3, calcium 8.6, BMP 361.1, troponin 516, and hemoglobin 11.9.  Chest x-ray showed moderate pulmonary edema   Medications: Outpatient medications: Medications Prior to Admission  Medication Sig Dispense Refill Last Dose   digoxin (LANOXIN) 0.125 MG tablet Take 1 tablet (0.125 mg total) by mouth daily. 90 tablet 3    furosemide (LASIX) 40 MG tablet Take 0.5 tablets (20 mg total) by mouth daily. 45 tablet 3    oxyCODONE-acetaminophen (PERCOCET) 10-325 MG tablet Take 1 tablet by mouth 3 (three) times daily as needed.      aspirin EC 81 MG EC tablet Take 1 tablet (81 mg total) by mouth daily. 30 tablet 3    atorvastatin  (LIPITOR) 80 MG tablet Take 1 tablet (80 mg total) by mouth daily at 6 PM. (Patient not taking: Reported on 11-02-2022) 90 tablet 3 Not Taking   cyclobenzaprine (FLEXERIL) 10 MG tablet Take 1 tablet (10 mg total) by mouth 2 (two) times daily as needed for muscle spasms. (Patient not taking: Reported on 11-02-2022) 20 tablet 0 Not Taking   ibuprofen (ADVIL) 800 MG tablet Take 1 tablet (800 mg total) by mouth every 8 (eight) hours as needed for mild pain. 12 tablet 0 prn   losartan (COZAAR) 25 MG tablet Take 0.5 tablets (12.5 mg total) by mouth daily. (Patient not taking: Reported on 11-02-2022) 45 tablet 3 Not Taking   metoprolol succinate (TOPROL XL) 25 MG 24 hr tablet Take 0.5 tablets (12.5 mg total) by mouth daily. (Patient not taking: Reported on 11-02-2022) 45 tablet 3 Not Taking   nitroGLYCERIN (NITROSTAT) 0.4 MG SL tablet Place 1 tablet (0.4 mg total) under the tongue every 5 (five) minutes x 3 doses as needed for chest pain. 25 tablet 1 prn   spironolactone (ALDACTONE) 25 MG tablet Take 0.5 tablets (12.5 mg total) by mouth at bedtime. (Patient not taking: Reported on 11-02-2022) 45 tablet 3 Not Taking   traMADol (ULTRAM) 50  MG tablet Take 1 tablet (50 mg total) by mouth every 6 (six) hours as needed. (Patient not taking: Reported on 11-Jul-2022) 15 tablet 0 Not Taking   warfarin (COUMADIN) 5 MG tablet Take 1-2 TABLETS DAILY OR AS PRESCRIBED BY COUMADIN CLINIC.  NEEDS COUMADIN INR CHECK FOR REFILLS 10 tablet 0     Current medications: Current Facility-Administered Medications  Medication Dose Route Frequency Provider Last Rate Last Admin    prismasol BGK 4/2.5 infusion   CRRT Continuous Lateef, Munsoor, MD 500 mL/hr at 06/08/2022 1512 New Bag at 06/09/2022 1512    prismasol BGK 4/2.5 infusion   CRRT Continuous Lateef, Munsoor, MD 500 mL/hr at 07/02/2022 1511 New Bag at 06/18/2022 1511   0.9 %  sodium chloride infusion  250 mL Intravenous Continuous Selinda Eon, RPH       artificial tears (LACRILUBE)  ophthalmic ointment   Both Eyes Q4H PRN Rust-Chester, Micheline Rough L, NP       budesonide (PULMICORT) nebulizer solution 0.5 mg  0.5 mg Nebulization BID Harlon Ditty D, NP   0.5 mg at 07/03/2022 8119   Chlorhexidine Gluconate Cloth 2 % PADS 6 each  6 each Topical Daily Erin Fulling, MD   6 each at 06/16/2022 0953   docusate (COLACE) 50 MG/5ML liquid 100 mg  100 mg Per Tube BID PRN Judithe Modest, NP       docusate (COLACE) 50 MG/5ML liquid 100 mg  100 mg Per Tube BID Harlon Ditty D, NP   100 mg at 07/11/2022 2246   EPINEPHrine (ADRENALIN) 5 mg in NS 250 mL (0.02 mg/mL) premix infusion  0.5-20 mcg/min Intravenous Titrated Harlon Ditty D, NP 60 mL/hr at 06/11/2022 1700 20 mcg/min at 06/14/2022 1700   fentaNYL (SUBLIMAZE) bolus via infusion 50-100 mcg  50-100 mcg Intravenous Q15 min PRN Harlon Ditty D, NP       fentaNYL in NS (15mcg/ml) infusion-PREMIX  50-200 mcg/hr Intravenous Continuous Harlon Ditty D, NP 20 mL/hr at 06/16/2022 1700 200 mcg/hr at 07/01/2022 1700   heparin ADULT infusion 100 units/mL (25000 units/252mL)  650 Units/hr Intravenous Continuous Selinda Eon, RPH 6.5 mL/hr at 06/22/2022 1700 650 Units/hr at 06/28/2022 1700   heparin injection 1,000-6,000 Units  1,000-6,000 Units CRRT PRN Lateef, Munsoor, MD       hydrocortisone sodium succinate (SOLU-CORTEF) 100 MG injection 100 mg  100 mg Intravenous Q8H Beers, Brandon D, RPH   100 mg at 06/25/2022 1511   insulin aspart (novoLOG) injection 0-15 Units  0-15 Units Subcutaneous Q4H Harlon Ditty D, NP   2 Units at 07-11-2022 2046   ipratropium-albuterol (DUONEB) 0.5-2.5 (3) MG/3ML nebulizer solution 3 mL  3 mL Nebulization Q6H Harlon Ditty D, NP   3 mL at 06/08/2022 1504   midazolam (VERSED) 100 mg/100 mL (1 mg/mL) premix infusion  0.5-10 mg/hr Intravenous Continuous Rust-Chester, Britton L, NP 7 mL/hr at 06/18/2022 1700 7 mg/hr at 06/29/2022 1700   midazolam (VERSED) bolus via infusion 2 mg  2 mg Intravenous Q2H PRN Rust-Chester,  Micheline Rough L, NP       norepinephrine (LEVOPHED) 16 mg in premix infusion  0-200 mcg/min Intravenous Titrated Erin Fulling, MD 187.5 mL/hr at 06/12/2022 1700 200 mcg/min at 06/10/2022 1700   ondansetron (ZOFRAN) injection 4 mg  4 mg Intravenous Q6H PRN Judithe Modest, NP       Oral care mouth rinse  15 mL Mouth Rinse Q2H Erin Fulling, MD   15 mL at 06/11/2022 1553   Oral  care mouth rinse  15 mL Mouth Rinse PRN Erin Fulling, MD       pantoprazole (PROTONIX) injection 40 mg  40 mg Intravenous QHS Harlon Ditty D, NP   40 mg at 06/25/2022 2246   phenylephrine CONCENTRATED 100mg  in sodium chloride 0.9% (0.4mg /mL) infusion  0-400 mcg/min Intravenous Titrated , MD 60 mL/hr at 07-17-2022 1700 400 mcg/min at 07-17-22 1700   piperacillin-tazobactam (ZOSYN) IVPB 3.375 g  3.375 g Intravenous Q6H Beers07/20/23, RPH 100 mL/hr at Jul 17, 2022 1715 3.375 g at 17-Jul-2022 1715   polyethylene glycol (MIRALAX / GLYCOLAX) packet 17 g  17 g Per Tube Daily PRN 06/22/22, NP       polyethylene glycol (MIRALAX / GLYCOLAX) packet 17 g  17 g Per Tube Daily Judithe Modest, NP       prismasol BGK 4/2.5 infusion   CRRT Continuous Judithe Modest, Munsoor, MD 2,500 mL/hr at July 17, 2022 1512 New Bag at July 17, 2022 1512   sodium bicarbonate 150 mEq in sterile water 1,150 mL infusion   Intravenous Continuous 06/22/22, MD 150 mL/hr at 2022/07/17 1700 Infusion Verify at 07-17-2022 1700   vasopressin (PITRESSIN) 20 Units in sodium chloride 0.9 % 100 mL infusion-*FOR SHOCK*  0-0.03 Units/min Intravenous Continuous 08-27-1969, MD 12 mL/hr at 2022-07-17 1700 0.04 Units/min at 07/17/22 1700   vecuronium (NORCURON) injection 10 mg  10 mg Intravenous Q1H PRN 06/22/22, MD   10 mg at 07-17-22 1101      Allergies: Allergies  Allergen Reactions   Methocarbamol Nausea And Vomiting and Anxiety      Past Medical History: Past Medical History:  Diagnosis Date   Arthritis    "right hip" (06/25/2018)   Bipolar 1 disorder (HCC)     CHF (congestive heart failure) (HCC)    Childhood asthma    Chronic lower back pain    Headache    "monthly" (06/25/2018)   High cholesterol    Hip pain, chronic    Hypertension    Pneumonia 1990s X 1   PTSD (post-traumatic stress disorder)    Stroke (HCC) 06/24/2018   left parietal CVA/notes 06/25/2018 "speech comes and goes" (06/25/2018)     Past Surgical History: Past Surgical History:  Procedure Laterality Date   FRACTURE SURGERY     ORIF ANKLE FRACTURE Right 1985   RIGHT/LEFT HEART CATH AND CORONARY ANGIOGRAPHY N/A 02/14/2018   Procedure: RIGHT/LEFT HEART CATH AND CORONARY ANGIOGRAPHY;  Surgeon: 02/16/2018, MD;  Location: [facility] INVASIVE CV LAB;  Service: Cardiovascular;  Laterality: N/A;   TONSILLECTOMY       Family History: No family history on file.   Social History: Social History   Socioeconomic History   Marital status: Divorced    Spouse name: Not on file   Number of children: Not on file   Years of education: Not on file   Highest education level: Not on file  Occupational History   Not on file  Tobacco Use   Smoking status: Every Day    Packs/day: 0.50    Years: 41.00    Total pack years: 20.50    Types: Cigarettes   Smokeless tobacco: Never  Vaping Use   Vaping Use: Never used  Substance and Sexual Activity   Alcohol use: Yes    Alcohol/week: 1.0 standard drink of alcohol    Types: 1 Cans of beer per week   Drug use: Not Currently   Sexual activity: Yes  Other Topics Concern   Not on  file  Social History Narrative   Not on file   Social Determinants of Health   Financial Resource Strain: Not on file  Food Insecurity: Not on file  Transportation Needs: Unmet Transportation Needs (03/10/2021)   PRAPARE - Administrator, Civil Service (Medical): Yes    Lack of Transportation (Non-Medical): Yes  Physical Activity: Not on file  Stress: Not on file  Social Connections: Not on file  Intimate Partner Violence: Not on file      Review of Systems: Review of Systems  Unable to perform ROS: Intubated    Vital Signs: Blood pressure 115/79, pulse 89, temperature 97.7 F (36.5 C), resp. rate (!) 0, height 5\' 9"  (1.753 m), weight 66.3 kg, SpO2 (!) 83 %.  Weight trends: Filed Weights   2022-07-17 1245  Weight: 66.3 kg    Physical Exam: General: Critically ill-appearing  Head: Normocephalic, atraumatic.   Eyes: Anicteric  Neck: Supple  Lungs:  Intubated on vent  Heart: Regular rate and rhythm  Abdomen:  Soft, nontender, obese  Extremities: Trace peripheral edema.  Neurologic: Nonfocal, moving all four extremities  Skin: No lesions  Access: Rt IJ temp cath     Lab results: Basic Metabolic Panel: Recent Labs  Lab 2022/07/17 2003 06/15/2022 0448 06/29/2022 1031 06/30/2022 1619  NA  --  140 146* 146*  K  --  5.0 4.2 4.6  CL  --  107 104 100  CO2  --  16* 17* 17*  GLUCOSE  --  153* 43* 119*  BUN  --  25* 28* 31*  CREATININE  --  2.71* 3.34* 3.62*  CALCIUM  --  7.7* 7.2* 6.8*  MG 2.2 2.3  --   --   PHOS 8.1* 9.0*  --  11.7*    Liver Function Tests: Recent Labs  Lab July 17, 2022 1202 06/09/2022 1619  AST 42*  --   ALT 25  --   ALKPHOS 53  --   BILITOT 0.7  --   PROT 6.7  --   ALBUMIN 3.5 1.8*   No results for input(s): "LIPASE", "AMYLASE" in the last 168 hours. No results for input(s): "AMMONIA" in the last 168 hours.  CBC: Recent Labs  Lab Jul 17, 2022 1202 06/17/2022 0448 06/30/2022 1031  WBC 6.4 11.3* 9.1  NEUTROABS  --   --  6.4  HGB 11.9* 14.5 12.7*  HCT 39.2 49.0 43.9  MCV 99.5 99.8 101.6*  PLT 187 148* 115*    Cardiac Enzymes: No results for input(s): "CKTOTAL", "CKMB", "CKMBINDEX", "TROPONINI" in the last 168 hours.  BNP: Invalid input(s): "POCBNP"  CBG: Recent Labs  Lab 06/11/2022 1152 06/06/2022 1209 06/05/2022 1228 06/18/2022 1550 06/03/2022 1619  GLUCAP 35* 34* 189* 63* 127*    Microbiology: Results for orders placed or performed during the hospital encounter of 2022-07-17   MRSA Next Gen by PCR, Nasal     Status: None   Collection Time: 17-Jul-2022  4:00 PM   Specimen: Nasal Mucosa; Nasal Swab  Result Value Ref Range Status   MRSA by PCR Next Gen NOT DETECTED NOT DETECTED Final    Comment: (NOTE) The GeneXpert MRSA Assay (FDA approved for NASAL specimens only), is one component of a comprehensive MRSA colonization surveillance program. It is not intended to diagnose MRSA infection nor to guide or monitor treatment for MRSA infections. Test performance is not FDA approved in patients less than 5 years old. Performed at [facility], [address] [place], [place]  Coagulation Studies: Recent Labs    07-08-2022 1202  LABPROT 14.9  INR 1.2    Urinalysis: Recent Labs    07/08/2022 2010  COLORURINE AMBER*  LABSPEC 1.023  PHURINE 5.0  GLUCOSEU NEGATIVE  HGBUR MODERATE*  BILIRUBINUR NEGATIVE  KETONESUR NEGATIVE  PROTEINUR 100*  NITRITE NEGATIVE  LEUKOCYTESUR NEGATIVE      Imaging: DG Chest Port 1 View  Result Date: 06/04/2022 CLINICAL DATA:  Central line placement EXAM: PORTABLE CHEST 1 VIEW COMPARISON:  06/16/2022 6 a.m. FINDINGS: New left IJ central line tip overlies SVC. Endotracheal tube, enteric tube, and right IJ central line again seen. No pneumothorax. Persistent bilateral opacities. Aeration is similar. Probable right pleural effusion is again noted. Similar cardiomediastinal contours. IMPRESSION: New left IJ line tip overlies SVC. No pneumothorax. Similar aeration with bilateral opacities and probable right pleural effusion. Electronically Signed   By: Guadlupe Spanish M.D.   On: 06/25/2022 12:06   ECHOCARDIOGRAM COMPLETE  Result Date: 06/11/2022    ECHOCARDIOGRAM REPORT   Patient Name:   Shemuel QUEZADA Date of Exam: 06/30/2022 Medical Rec #:  [id number]    Height:       69.0 in Accession #:    [id number]   Weight:       146.2 lb Date of Birth:  [birth year]    BSA:          1.808 m Patient Age:    65 years     BP:            113/91 mmHg Patient Gender: M            HR:           65 bpm. Exam Location:  [facility] Procedure: 2D Echo, Cardiac Doppler and Color Doppler Indications:     CHF-acute systolic I50.21  History:         Patient has prior history of Echocardiogram examinations, most                  recent 05/25/2021. CHF, CAD; Risk Factors:Current Smoker,                  Hypertension and Dyslipidemia.  Sonographer:     Cristela Blue Referring Phys:  [id number] SHERI HAMMOCK Diagnosing Phys: Yvonne Kendall MD  Sonographer Comments: Echo performed with patient supine and on artificial respirator. IMPRESSIONS  1. Left ventricular ejection fraction, by estimation, is 25 to 30%. The left ventricle has severely decreased function. The left ventricle demonstrates regional wall motion abnormalities (see scoring diagram/findings for description). Left ventricular diastolic function could not be evaluated. There is akinesis of the left ventricular, entire inferoseptal wall, inferior wall and inferolateral wall.  2. Right ventricular systolic function is normal. The right ventricular size is normal.  3. Left atrial size was moderately dilated.  4. A small pericardial effusion is present. The pericardial effusion is localized near the right ventricle.  5. The mitral valve is abnormal. Moderate mitral valve regurgitation.  6. Tricuspid valve regurgitation is mild to moderate.  7. The aortic valve was not well visualized. FINDINGS  Left Ventricle: Left ventricular dimensions not adequately assessed on this study. Left ventricular ejection fraction, by estimation, is 25 to 30%. The left ventricle has severely decreased function. The left ventricle demonstrates regional wall motion abnormalities. Left ventricular diastolic function could not be evaluated. Right Ventricle: The right ventricular size is normal. Right vetricular wall thickness was not well visualized. Right ventricular systolic function is normal. Left Atrium: Left atrial size  was moderately  dilated. Right Atrium: Right atrial size was normal in size. Pericardium: A small pericardial effusion is present. The pericardial effusion is localized near the right ventricle. Mitral Valve: The mitral valve is abnormal. There is mild thickening of the mitral valve leaflet(s). Mild mitral annular calcification. Moderate mitral valve regurgitation. Tricuspid Valve: The tricuspid valve is not well visualized. Tricuspid valve regurgitation is mild to moderate. Aortic Valve: The aortic valve was not well visualized. Pulmonic Valve: The pulmonic valve was not well visualized. Aorta: The aortic root was not well visualized. Venous: IVC assessment for right atrial pressure unable to be performed due to mechanical ventilation. IAS/Shunts: No atrial level shunt detected by color flow Doppler.  LEFT VENTRICLE PLAX 2D LVIDd:         5.30 cm LVIDs:         4.90 cm LV PW:         2.10 cm LV IVS:        1.40 cm  TRICUSPID VALVE TR Peak grad:   39.9 mmHg TR Vmax:        316.00 cm/s Yvonne Kendall MD Electronically signed by Yvonne Kendall MD Signature Date/Time: 2022/06/27/9:31:41 AM    Final    DG Chest Port 1 View  Result Date: 06-27-2022 CLINICAL DATA:  Acute respiratory failure. EXAM: PORTABLE CHEST 1 VIEW COMPARISON:  06/08/2022 FINDINGS: 0603 hours. Endotracheal tube tip is approximately 6.6 cm above the base of the carina. The NG tube passes into the stomach although the distal tip position is not included on the film. Right IJ central line tip overlies the distal SVC level, new in the interval. Diffuse right greater than left airspace disease is again noted, slightly progressive at the right base. Cardiopericardial silhouette is at upper limits of normal for size. Telemetry leads overlie the chest. IMPRESSION: 1. New right IJ central line tip overlies the distal SVC level. No pneumothorax 2. Diffuse bilateral airspace disease, right greater than left. Features compatible with asymmetric edema or diffuse infection.  Electronically Signed   By: Kennith Center M.D.   On: 06/27/22 06:32   DG Chest Portable 1 View  Result Date: 07/03/2022 CLINICAL DATA:  Provided history: Intubation. Encounter for intubation and OG tube placement. EXAM: PORTABLE CHEST 1 VIEW COMPARISON:  Prior chest radiograph 06/30/2022 and earlier. FINDINGS: Interval intubation. The ET tube terminates just below level of the clavicular heads. An enteric tube is present with tip projecting at the level of the gastric cardia, and side port at the level of the distal esophagus. Mild cardiomegaly. Since the chest radiograph performed earlier today, there has been significant progression of interstitial and airspace opacities throughout both lungs. No evidence of pleural effusion or pneumothorax. No acute bony abnormality identified. Degenerative changes of the spine. Impression #1 called by telephone at the time of interpretation on 06/13/2022 at 1:26 pm to provider Dr. Juliette Alcide, who verbally acknowledged these results. IMPRESSION: 1. The enteric tube terminates at the level of the gastric cardia (with side port at the level of the distal esophagus). Consider tube advancement. 2. ET tube present with tip projecting just below the level of the clavicular heads. 3. Significant interval progression of interstitial and airspace opacities throughout both lungs. This may reflect edema. However, correlate clinically to exclude signs/symptoms that would alternatively suggest pneumonia. 4. Mild cardiomegaly. Electronically Signed   By: Jackey Loge D.O.   On: 06/06/2022 13:29   DG Chest Portable 1 View  Result Date: 07/02/2022 CLINICAL DATA:  Altered mental  status EXAM: PORTABLE CHEST 1 VIEW COMPARISON:  Chest x-ray dated March 12 20,019 FINDINGS: Unchanged cardiomegaly. Moderate bilateral interstitial and airspace opacities. No large pleural effusion or pneumothorax. IMPRESSION: Moderate pulmonary edema. Electronically Signed   By: Allegra Lai M.D.   On:  06-24-2022 12:22     Assessment & Plan: Mr. Keyler Hoge is a 66 y.o.  male with past medical history including hyperlipidemia, congestive heart failure, hypertension, stroke, PTSD, and bipolar disorder, who was admitted to [facility] on 06-24-2022 for Acute respiratory failure with hypoxia and hypercarbia (HCC) [J96.01, J96.02]   Acute kidney injury with hypokalemia likely due to severe illness, NSTEMI, cardiogenic shock.  Creatinine 2.71 on admission, currently 3.62.  Decreased urine output noted.No IV contrast exposure.  Due to decreased renal output and severity of current illness, will initiate CRRT and will maintain net negative status.Will monitor daily labs and urine output.Potassium will be managed with CRRT as well.   2. Hypotension due to cardiogenic shock. Continue pressors and milrinone drip.   3. Chronic systolic heart failure, ECHO shoes EF 25-30%  Will maintain CRRT net 0 at this time. Will attempt to manage fluid when stable.      LOS: 1   7/18/20236:06 PM

## 2022-07-04 NOTE — Progress Notes (Signed)
Despite being maxed on all pressors, unable to reach MAP of 60. Not pulling any fluid on CRRT due to BP. MD aware. Per pts sister, pts daughter Wandalee Ferdinand is on her way.

## 2022-07-04 NOTE — IPAL (Signed)
  Interdisciplinary Goals of Care Family Meeting   Date carried out: 07/02/2022  Location of the meeting: Phone conference  Member's involved: Physician, Nurse Practitioner, and Family Member or next of kin        GOALS OF CARE DISCUSSION  The Clinical status was relayed to daughter in detail-  Updated and notified of patients medical condition- Patient remains unresponsive and will not open eyes to command.   Patient is having a weak cough and struggling to remove secretions.   Patient with increased WOB and using accessory muscles to breathe Explained to family course of therapy and the modalities   Patient with Progressive multiorgan failure with a very high probablity of a very minimal chance of meaningful recovery despite all aggressive and optimal medical therapy.   Oxygen saturation 76% on the ventilator. Arterial MAPs dropping to 59.  order to start epi gtt and give one amp of bicarb. Verbal order for levo titration range 0-200 mcg.   Patient with MASSIVE MI with MULTIORGAN FAILURE ACIDOSIS IS INCOMPATIBLE WITH LIFE MULTIPLE PRESSORS STARTED SEVERE HYPOXIA DESPITE FULL VEN SUPPORT DAUGHTER CHARISMA UPDATED SEVERAL TIMES VASC CATH PLACED FOR CRRT    Family are satisfied with Plan of action and management. All questions answered  Additional CC time 35 mins   Wilkins Elpers Santiago Glad, M.D.  [facility]  Medical Director [facility] Medical Director [facility] Cardio-Pulmonary Department

## 2022-07-04 NOTE — Progress Notes (Signed)
*  PRELIMINARY RESULTS* Echocardiogram 2D Echocardiogram has been performed.  Cristela Blue 07/11/2022, 7:36 AM

## 2022-07-04 NOTE — Progress Notes (Signed)
NAME:  Ricardo Brooks, MRN:  [id number], DOB:  Apr 19, 1956, LOS: 1 ADMISSION DATE:  06/03/2022, CONSULTATION DATE:  06/18/2022 REFERRING MD:  Dr. Charna Archer, CHIEF COMPLAINT:  Chest pain, near syncope  Brief Pt Description / Synopsis:  66 y.o. male admitted with Acute Hypoxic & Hypercapnic Respiratory Failure in the setting of acute on chronic HFrEF, NSTEMI, Cardiogenic shock, and AKI likely due to Cardiorenal Syndrome requiring intubation and mechanical ventilation.  History of Present Illness:  Ricardo Brooks is a 66 year old male with a past medical history significant for HFrEF, ischemic cardiomyopathy, hypertension, hyperlipidemia, PTSD, stroke, bipolar disorder, chronic back pain who presented to [facility] ED on 07/02/2022 due to complaints of chest pain, near syncope, and hypotension.  Patient is currently intubated and sedated, unable to contribute to history, and no family is currently available,  therefore history is obtained from chart review.  Per ED and nursing notes, on his worksite today he developed chest pain, near syncope and was found to be hypotensive.  After arrival to the ED he was noted to have escalating FiO2 requirements and acute respiratory distress, failing trial of BiPAP and requiring intubation.  Initially there was concern for cardiogenic shock as a systolic blood pressure dropped to the 50s, but blood pressure recovered and he has never had to be started on Levophed.  ED Course: Initial Vital Signs: Temperature 97.2 F, respiratory rate 32, pulse 60, blood pressure 95/69, SPO2 93% Significant Labs: Potassium 3.3, glucose 162, hemoglobin 11.9, high-sensitivity troponin 516, BNP 361, INR 1.1 VBG: pH 7.22/P CO2 62/PO2 less than 31/bicarb 25.4 Urinalysis and tox screen pending Imaging Chest X-ray>>revealed moderate pulmonary edema EKG:  sinus brady, RBBB, unifocal PVC, ST depression and t wave inversion in v5 and v6, LVH with early repolarization Medications Administered: 1 L normal  saline bolus, 80 mg IV Lasix, heparin bolus and drip, Narcan 1 mg x 1 dose  Pertinent  Medical History   Past Medical History:  Diagnosis Date   Arthritis    "right hip" (06/25/2018)   Bipolar 1 disorder (HCC)    CHF (congestive heart failure) (HCC)    Childhood asthma    Chronic lower back pain    Headache    "monthly" (06/25/2018)   High cholesterol    Hip pain, chronic    Hypertension    Pneumonia 1990s X 1   PTSD (post-traumatic stress disorder)    Stroke ([place]) 06/24/2018   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  PCCM asked to admit.  Cardiology consulted for elevated troponin/CHF. 7/18: Critically ill, on multiple pressors. Severe metabolic acidosis requiring bicarb gtt.  Nephrology consulted, plan to initiate CRRT.  Left IJ trialysis catheter placed.  Interim History / Subjective:  -Yesterday evening  & overnight deteriorated with severe cardiogenic shock requiring multiple pressors (20 mics of Levophed and vasopressin) ~check cortisol and start stress dose steroids -Troponin is greater than 24,000, on heparin drip -Dr. Saunders Revel with cardiology at bedside, plans to initiate milrinone drip ~we will follow coox panel -Afebrile, hypoxic despite high vent support (100% FiO2 and 12 of PEEP ~peak pressures noted to be in the 40s, will add bronchodilators and Pulmicort nebs, vent settings adjusted -Patient with severe metabolic acidosis and worsening AKI ~was placed on  bicarb drip earlier this morning ~consult nephrology and will place trialysis catheter as anticipate likely will need CRRT -Patient lactic acid remains greater than 9 and procalcitonin increased to 34.9 (previously  negative), possible gut ischemia/abdominal process ~will empirically start Zosyn  Objective   Blood pressure 104/83, pulse 100, temperature 99.1 F (37.3 C), resp. rate (!) 0, height 5' 9"  (1.753 m), weight 66.3 kg, SpO2 (!) 89 %.    Vent Mode: PRVC FiO2 (%):  [100 %] 100 % Set Rate:  [20 bmp-28 bmp] 28 bmp Vt Set:  [450 mL-500 mL] 500 mL PEEP:  [5 cmH20-12 cmH20] 12 cmH20 Plateau Pressure:  [43 cmH20] 43 cmH20   Intake/Output Summary (Last 24 hours) at July 03, 2022 6389 Last data filed at 07-03-22 0500 Gross per 24 hour  Intake 1567.38 ml  Output 230 ml  Net 1337.38 ml    Filed Weights   06/10/2022 1245  Weight: 66.3 kg    Examination: General: Critically ill-appearing male, laying in bed, intubated and sedated, no acute distress HENT: Atraumatic, normocephalic, neck supple, +JVD, orally intubated (ET tube with copious amounts of white frothy sputum) Lungs: Coarse rhonchi throughout, even, synchronous with the vent, nonlabored Cardiovascular: Regular rate and rhythm, S1-S2, no murmurs, rubs, gallop Abdomen: Soft, nontender, nondistended, no guarding rebound tenderness, bowel sounds positive x4 Extremities: Normal bulk and tone, no edema no cyanosis Neuro: Heavily sedated due to critical illness and high FiO2 requirements, pupils PERRLA GU: Foley catheter in place draining dark yellow urine  Resolved Hospital Problem list     Assessment & Plan:   Severe Acute Hypoxic & Hypercapnic Respiratory Failure in the setting of Pulmonary Edema  -Full vent support, implement lung protective strategies -Plateau pressures less than 30 cm H20 -Wean FiO2 & PEEP as tolerated to maintain O2 sats >92% -Follow intermittent Chest X-ray & ABG as needed -Spontaneous Breathing Trials when respiratory parameters met and mental status permits -Implement VAP Bundle -Bronchodilators & Pulmicort nebs -Diuresis as blood pressure and renal function permits (received 80 mg IV Lasix x1 dose in ED) ~  unable to tolerate currently due to hypotension  Acute on Chronic HFrEF (LVEF 30 to 35%) Elevated troponin in setting of NSTEMI Shock: Suspect Cardiogenic PMHx: Ischemic cardiomyopathy, LV thrombus, hypertension, hyperlipidemia -Continuous cardiac monitoring -Maintain MAP >65 -Cautious IV fluids (received 1 L normal saline bolus in ED) ~ Follow CVP  -Vasopressors as needed to maintain MAP goal -Check cortisol, add stress dose steroids -Trend lactic acid until normalized (7.4 ~ 5.5 ~ 4.0 ~ >9.0 ~ >9.0 ~ ) -Trend HS Troponin until peaked (>24,000 ) -Urine drug screen +for benzo's & opiates (received ativan and fentanyl in ED) -BNP 361 at admission -Repeat echocardiogram pending -Cardiology following, appreciate input ~ plan to add Milrinone -Heparin drip for anticoagulation -Diuresis as blood pressure and renal function permits ~ holding due to shock  AKI, suspect Cardiorenal Syndrome Severe Anion Gap Metabolic Acidosis in setting of AKI and Lactic Acidosis -Monitor I&O's / urinary output -Follow BMP -Ensure adequate renal perfusion -Avoid nephrotoxic agents as able -Replace electrolytes as indicated -Trend lactic acid -Bicarb gtt -Nephrology consulted, appreciate input, likely will need CRRT  Leukocytosis, suspect reactive ? -Monitor fever curve -Trend WBC's & Procalcitonin -Follow cultures as above -Urinalysis unimpressive, chest x-ray and frothy white secretions seems more consistent with pulmonary edema  ~ however Will obtain tracheal aspirate -concern also for possible gut ischemia given severe lactic acidosis -Start empiric Zosyn pending cultures & sensitivities  Acute Metabolic Encephalopathy Sedation needs in  the setting of mechanical ventilation PMHx: Bipolar disorder -Maintain a RASS goal of -3 to -4 given critical illness/severe hypoxia and need for ventilator synchrony -Fentanyl and Versed as needed to maintain RASS goal -As needed vecuronium as needed to ensure  ventilator synchrony -Avoid sedating medications as able -Daily wake up assessment  Hyperglycemia -CBG's q4h; Target range of 140 to 180 -SSI -Follow ICU Hypo/Hyperglycemia protocol -Check hemoglobin A1c   Pt is critically ill, prognosis is extremely guarded.  High risk for cardiac arrest and death.  Recommend DNR/DNI status  Best Practice (right click and "Reselect all SmartList Selections" daily)   Diet/type: NPO DVT prophylaxis: Systemic heparin GI prophylaxis: PPI Lines: N/A Foley: Yes, and is still needed Code Status:  full code Last date of multidisciplinary goals of care discussion [N/A]  Dr. Mortimer Fries updated pt's daughter updated via telephone 7/18.  Labs   CBC: Recent Labs  Lab 06/26/2022 1202 June 24, 2022 0448  WBC 6.4 11.3*  HGB 11.9* 14.5  HCT 39.2 49.0  MCV 99.5 99.8  PLT 187 148*     Basic Metabolic Panel: Recent Labs  Lab 06/24/2022 1202 06/10/2022 2003 06-24-2022 0448  NA 140  --  140  K 3.3*  --  5.0  CL 106  --  107  CO2 22  --  16*  GLUCOSE 162*  --  153*  BUN 11  --  25*  CREATININE 1.13  --  2.71*  CALCIUM 8.6*  --  7.7*  MG  --  2.2 2.3  PHOS  --  8.1* 9.0*    GFR: Estimated Creatinine Clearance: 26.2 mL/min (A) (by C-G formula based on SCr of 2.71 mg/dL (H)). Recent Labs  Lab 06/08/2022 1202 06/18/2022 1600 06/29/2022 1711 06/14/2022 2003 June 24, 2022 0448  PROCALCITON  --  <0.10  --   --   --   WBC 6.4  --   --   --  11.3*  LATICACIDVEN  --  7.4* 5.5* 4.0* >9.0*     Liver Function Tests: Recent Labs  Lab 06/27/2022 1202  AST 42*  ALT 25  ALKPHOS 53  BILITOT 0.7  PROT 6.7  ALBUMIN 3.5    No results for input(s): "LIPASE", "AMYLASE" in the last 168 hours. No results for input(s): "AMMONIA" in the last 168 hours.  ABG    Component Value Date/Time   PHART 7.05 (LL) 2022-06-24 0500   PCO2ART 51 (H) 2022/06/24 0500   PO2ART 67 (L) Jun 24, 2022 0500   HCO3 14.1 (L) June 24, 2022 0500   TCO2 22 02/14/2018 0903   ACIDBASEDEF 16.4 (H)  2022/06/24 0500   O2SAT 88.3 24-Jun-2022 0500     Coagulation Profile: Recent Labs  Lab 06/21/2022 1202  INR 1.2     Cardiac Enzymes: No results for input(s): "CKTOTAL", "CKMB", "CKMBINDEX", "TROPONINI" in the last 168 hours.  HbA1C: Hgb A1c MFr Bld  Date/Time Value Ref Range Status  06/25/2018 04:18 AM 5.6 4.8 - 5.6 % Final    Comment:    (NOTE) Pre diabetes:          5.7%-6.4% Diabetes:              >6.4% Glycemic control for   <7.0% adults with diabetes   02/13/2018 12:12 AM 5.7 (H) 4.8 - 5.6 % Final    Comment:    (NOTE) Pre diabetes:          5.7%-6.4% Diabetes:              >6.4% Glycemic control for   <  7.0% adults with diabetes     CBG: Recent Labs  Lab 06/09/2022 2018 2022-06-21 0004 06/21/22 0319 06/21/22 0347 21-Jun-2022 0721  GLUCAP 142* 77 55* 91 89    Review of Systems:   Unable to assess due to intubation/sedation/critical illness  Past Medical History:  He,  has a past medical history of Arthritis, Bipolar 1 disorder ([place]), CHF (congestive heart failure) ([place]), Childhood asthma, Chronic lower back pain, Headache, High cholesterol, Hip pain, chronic, Hypertension, Pneumonia (1990s X 1), PTSD (post-traumatic stress disorder), and Stroke ([place]) (06/24/2018).   Surgical History:   Past Surgical History:  Procedure Laterality Date   FRACTURE SURGERY     ORIF ANKLE FRACTURE Right 1985   RIGHT/LEFT HEART CATH AND CORONARY ANGIOGRAPHY N/A 02/14/2018   Procedure: RIGHT/LEFT HEART CATH AND CORONARY ANGIOGRAPHY;  Surgeon: Nelva Bush, MD;  Location: [facility];  Service: Cardiovascular;  Laterality: N/A;   TONSILLECTOMY       Social History:   reports that he has been smoking cigarettes. He has a 20.50 pack-year smoking history. He has never used smokeless tobacco. He reports current alcohol use of about 1.0 standard drink of alcohol per week. He reports that he does not currently use drugs.   Family History:  His family history is not on file.    Allergies Allergies  Allergen Reactions   Methocarbamol Nausea And Vomiting and Anxiety     Home Medications  Prior to Admission medications   Medication Sig Start Date End Date Taking? Authorizing Provider  aspirin EC 81 MG EC tablet Take 1 tablet (81 mg total) by mouth daily. 02/18/18   Duke, Tami Lin, PA  atorvastatin (LIPITOR) 80 MG tablet Take 1 tablet (80 mg total) by mouth daily at 6 PM. 08/26/18   Duke, Tami Lin, PA  cyclobenzaprine (FLEXERIL) 10 MG tablet Take 1 tablet (10 mg total) by mouth 2 (two) times daily as needed for muscle spasms. 05/29/20   Recardo Evangelist, PA-C  digoxin (LANOXIN) 0.125 MG tablet Take 1 tablet (0.125 mg total) by mouth daily. 05/09/22   Lorretta Harp, MD  furosemide (LASIX) 40 MG tablet Take 0.5 tablets (20 mg total) by mouth daily. 05/09/22   Lorretta Harp, MD  ibuprofen (ADVIL) 800 MG tablet Take 1 tablet (800 mg total) by mouth every 8 (eight) hours as needed for mild pain. 05/21/19   Ward, Delice Bison, DO  losartan (COZAAR) 25 MG tablet Take 0.5 tablets (12.5 mg total) by mouth daily. 08/14/19   Duke, Tami Lin, PA  metoprolol succinate (TOPROL XL) 25 MG 24 hr tablet Take 0.5 tablets (12.5 mg total) by mouth daily. 08/14/19   Duke, Tami Lin, PA  nitroGLYCERIN (NITROSTAT) 0.4 MG SL tablet Place 1 tablet (0.4 mg total) under the tongue every 5 (five) minutes x 3 doses as needed for chest pain. 02/17/18   Duke, Tami Lin, PA  oxyCODONE-acetaminophen (PERCOCET) 10-325 MG tablet Take 1 tablet by mouth 3 (three) times daily as needed. 02/15/21   [provider]  spironolactone (ALDACTONE) 25 MG tablet Take 0.5 tablets (12.5 mg total) by mouth at bedtime. 08/14/19   Duke, Tami Lin, PA  traMADol (ULTRAM) 50 MG tablet Take 1 tablet (50 mg total) by mouth every 6 (six) hours as needed. 05/29/20   Recardo Evangelist, PA-C  warfarin (COUMADIN) 5 MG tablet Take 1-2 TABLETS DAILY OR AS PRESCRIBED BY COUMADIN CLINIC.  NEEDS COUMADIN  INR CHECK FOR REFILLS 04/07/22   Lorretta Harp, MD  Critical care time: 40 minutes     Darel Hong, AGACNP-BC [facility] Prefer epic messenger for cross cover needs If after hours, please call E-link

## 2022-07-04 NOTE — Progress Notes (Signed)
[facility]  Date: Jul 03, 2022  Time: 5:25 PM  Patient has become progressively more acidotic, hypoxic, and hypotensive today.  He is now on vasopressin, norepinephrine, and epinephrine infusions with refractory shock.  Milrinone currently on hold; I will discontinue this altogether as it could further worsen hypotension.  Epinephrine infusion is also providing significant inotropic support as well.  Case discussed with Dr. Belia Heman; patient remains critically ill and is too sick for transport to cath lab due to his severe shock, severe acidosis, and marked hypoxia despite aggressive ventilator settings.  I recommend continued medical therapy.  Prognosis is poor in the setting of progressive multiorgan failure.  Yvonne Kendall, MD [facility]

## 2022-07-04 NOTE — Progress Notes (Signed)
Heparin gtt restarted per NP Elvina Sidle.

## 2022-07-04 NOTE — Consult Note (Signed)
PHARMACY CONSULT NOTE - FOLLOW UP  Pharmacy Consult for Electrolyte Monitoring and Replacement   Recent Labs: Potassium (mmol/L)  Date Value  06/04/2022 5.0   Magnesium (mg/dL)  Date Value  33/61/2244 2.3   Calcium (mg/dL)  Date Value  97/53/0051 7.7 (L)   Albumin (g/dL)  Date Value  09/23/1172 3.5  07/22/2019 4.1   Phosphorus (mg/dL)  Date Value  56/70/1410 9.0 (H)   Sodium (mmol/L)  Date Value  06/06/2022 140  08/26/2018 141     Assessment: 65YO M w/ h/o HFrEF, iCMP, HTN, HLD, PTSD, stroke, BP d/o, chronic back pain who presented to [facility] ED on 06/30/2022 d/t c/o chest pain, near syncope, and hypoTN.  Patient is currently intubated and sedated, unable to contribute to history, and no family is currently available. Pharmacy consulted for mgmt of electrolytes while in CCU.  Goal of Therapy:  Lytes WNL  Plan:  Scr 1.13>2.71 (BL unk).  K 3.3>5. After PO x1 on 7/17, No further repletion today. Mg 2.2>2.3:  Phos 8.1>9:  Next labs with AM labs 0500  Martyn Malay ,PharmD Clinical Pharmacist 06/21/2022 7:38 AM

## 2022-07-04 NOTE — Progress Notes (Signed)
Bair Hugger placed back onto patient for thermoregulation.

## 2022-07-04 NOTE — Progress Notes (Signed)
   07-04-22 1907  Clinical Encounter Type  Visited With Health care provider  Visit Type Death;Code  Spiritual Encounters  Spiritual Needs Prayer   Luna Fuse attended unit following code blue.  Chaplain offered non-anxious presence and silent prayer, checking-in with staff. As physician notified family of likelihood of pt's passing, this chaplain made her presence known in case the family would like prayer or might otherwise need spiritual care support.  At family request, this chaplain did offer prayer upon the passing of Mr. Capes.  Chaplain B also participated in debriefing following the code event as well as returning to unit within the hour to assess whether there were any further needs among the care team or family follow-up.  Please page Chaplain Sophronia Simas tonight if additional support might be helpful.

## 2022-07-04 NOTE — Procedures (Signed)
Hemodialysis Catheter Insertion Procedure Note Ricardo Brooks [id number] Oct 29, 1956  Procedure: Insertion of Hemodialysis Catheter Indications: Hemodialysis  Procedure Details Consent: Risks of procedure as well as the alternatives and risks of each were explained to the (patient/caregiver).  Consent for procedure obtained.  Time Out: Verified patient identification, verified procedure, site/side was marked, verified correct patient position, special equipment/implants available, medications/allergies/relevent history reviewed, required imaging and test results available.  Performed  Maximum sterile technique was used including antiseptics, cap, gloves, gown, hand hygiene, mask, and sheet.  Skin prep: Chlorhexidine; local anesthetic administered  A Trialysis HD catheter was placed in the left internal jugular vein using the Seldinger technique.  Evaluation Blood flow good Complications: No apparent complications Patient did tolerate procedure well. Chest X-ray ordered to verify placement.  CXR: pending.   Procedure performed under direct supervision of Dr. Belia Heman and with ultrasound guidance for real time vessel cannulation.      Line secured at the 20 cm mark. BIOPATCH applied to the insertion site.   Harlon Ditty, AGACNP-BC [facility] Prefer epic messenger for cross cover needs If after hours, please call E-link  06/09/2022, 11:26 AM

## 2022-07-04 NOTE — Progress Notes (Signed)
2 amps bicarb given at 1115 per verbal order MD Kasa. Bicarb gtt increased to 150, levo increased to 60 all per order with MD at bedside and NP Elvina Sidle

## 2022-07-04 NOTE — Progress Notes (Signed)
Oxygen saturation 76% on the ventilator. Arterial MAPs dropping to 59. Verbal order to start epi gtt and give one amp of bicarb. Verbal order for levo titration range 0-200 mcg.

## 2022-07-04 NOTE — Progress Notes (Signed)
ANTICOAGULATION CONSULT NOTE - Initial Consult  Pharmacy Consult for Heparin Drip Indication:  ACS/STEMI  Allergies  Allergen Reactions   Methocarbamol Nausea And Vomiting and Anxiety    Patient Measurements: Height: 5\' 9"  (175.3 cm) Weight: 66.3 kg (146 lb 2.6 oz) IBW/kg (Calculated) : 70.7 Heparin Dosing Weight: 66.3 kg  Vital Signs: Temp: 98.2 F (36.8 C) (07/18 1903) BP: 115/79 (07/18 1600) Pulse Rate: 81 (07/18 1839)  Labs: Recent Labs    06/12/2022 1202 06/11/2022 1600 06/08/2022 2003 06/26/2022 2003 07-03-22 0448 Jul 03, 2022 1031 Jul 03, 2022 1619 2022-07-03 1729  HGB 11.9*  --   --   --  14.5 12.7*  --   --   HCT 39.2  --   --   --  49.0 43.9  --   --   PLT 187  --   --   --  148* 115*  --   --   LABPROT 14.9  --   --   --   --   --   --   --   INR 1.2  --   --   --   --   --   --   --   HEPARINUNFRC  --   --  0.97*   < > 0.68 0.16*  --  0.16*  CREATININE 1.13  --   --   --  2.71* 3.34* 3.62*  --   TROPONINIHS 516* 2,532* >24,000*  --   --   --   --   --    < > = values in this interval not displayed.     Estimated Creatinine Clearance: 19.6 mL/min (A) (by C-G formula based on SCr of 3.62 mg/dL (H)).   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 Pharmacy consulted for Heparin dosing.  Date Time  HL Rate/Comment 0717 2003 0.97 Supratherapeutic; 800 > 650 un/hr 0718 0448 0.68 Therapeutic x1; 650 un/hr 0718 0900 -- Heparin paused for CVVH cath  insertion. 0718 1031 0.16 0718 1200 -- Heparin resumed 0718 1729 0.16       Baseline Labs: aPTT - unk INR - 1.2 Hgb - 14.5>12.7 Plts - 187>148>115  Goal of Therapy:  Heparin level 0.3-0.7 units/ml Monitor platelets by anticoagulation protocol: Yes   Plan:  Give heparin 2000 unit IV bolus then increase heparin infusion to 850 units/hr w/o bolus post-Cath insersion. Check anti-Xa level in 6 hours to confirm since previously therapeutic at this rate. Continue to monitor H&H and platelets  Thank you for allowing pharmacy to be a part of this patient's care.  01-20-1989 Clinical Pharmacist July 03, 2022 9:06 PM

## 2022-07-04 NOTE — Inpatient Diabetes Management (Signed)
Inpatient Diabetes Program Recommendations  AACE/ADA: New Consensus Statement on Inpatient Glycemic Control (2015)  Target Ranges:  Prepandial:   less than 140 mg/dL      Peak postprandial:   less than 180 mg/dL (1-2 hours)      Critically ill patients:  140 - 180 mg/dL    Latest Reference Range & Units 06/21/2022 03:19 06/05/2022 03:47 06/21/2022 07:21 06/29/2022 11:52 06/29/2022 12:09 06/10/2022 12:28  Glucose-Capillary 70 - 99 mg/dL 55 (L) 91 89 35 (LL) 34 (LL) 189 (H)     Admit Acute Hypoxic & Hypercapnic Respiratory Failure in the setting of acute on chronic HFrEF requiring intubation and mechanical ventilation, along with elevated troponins in the setting of demand ischemia versus NSTEMI   No History of Diabetes noted in H&P   Current Insulin Orders: Novolog 0-15 units Q4H   HYPO at 3am today (CBG 55)--Had only gotten 2 units Novolog at 9pm prior (6 hours prior).    Starting Solucortef 100 mg Q8H this AM.    Currently intubated and NPO   MD- Note Hypoglycemia.  Please consider reduction of the Novolog SSI to the Very Sensitive scale Q4 hours for now--Novolog 0-6 units Q4 hours    --Will follow patient during hospitalization--  Ambrose Finland RN, MSN, CDCES Diabetes Coordinator Inpatient Glycemic Control Team Team Pager: [phone] (8a-5p)

## 2022-07-04 NOTE — Progress Notes (Signed)
Heparin gtt stopped in preparation for potential catheter placement per MD Kasa.

## 2022-07-04 DEATH — deceased

## 2022-07-05 LAB — BLOOD GAS, ARTERIAL
Acid-base deficit: 12.3 mmol/L — ABNORMAL HIGH (ref 0.0–2.0)
Bicarbonate: 19.1 mmol/L — ABNORMAL LOW (ref 20.0–28.0)
FIO2: 100 %
MECHVT: 450 mL
Mechanical Rate: 20
O2 Saturation: 95.3 %
PEEP: 10 cmH2O
Patient temperature: 37
pCO2 arterial: 69 mmHg (ref 32–48)
pH, Arterial: 7.05 — CL (ref 7.35–7.45)
pO2, Arterial: 87 mmHg (ref 83–108)
